# Patient Record
Sex: Female | Born: 1970
Health system: Southern US, Community
[De-identification: ages and names within clinical notes are randomized; demographics above are authoritative.]

## PROBLEM LIST (undated history)

## (undated) DIAGNOSIS — J45909 Unspecified asthma, uncomplicated: Secondary | ICD-10-CM

## (undated) DIAGNOSIS — G709 Myoneural disorder, unspecified: Secondary | ICD-10-CM

## (undated) DIAGNOSIS — R0602 Shortness of breath: Secondary | ICD-10-CM

## (undated) DIAGNOSIS — K4090 Unilateral inguinal hernia, without obstruction or gangrene, not specified as recurrent: Secondary | ICD-10-CM

## (undated) DIAGNOSIS — N189 Chronic kidney disease, unspecified: Secondary | ICD-10-CM

## (undated) DIAGNOSIS — T7840XA Allergy, unspecified, initial encounter: Secondary | ICD-10-CM

## (undated) DIAGNOSIS — M1990 Unspecified osteoarthritis, unspecified site: Secondary | ICD-10-CM

## (undated) DIAGNOSIS — I509 Heart failure, unspecified: Secondary | ICD-10-CM

## (undated) HISTORY — PX: COLONOSCOPY: SHX174

## (undated) HISTORY — DX: Unspecified asthma, uncomplicated: J45.909

## (undated) HISTORY — DX: Unilateral inguinal hernia, without obstruction or gangrene, not specified as recurrent: K40.90

## (undated) HISTORY — DX: Allergy, unspecified, initial encounter: T78.40XA

## (undated) HISTORY — PX: POLYPECTOMY: SHX149

## (undated) HISTORY — PX: TUBAL LIGATION: SHX77

## (undated) HISTORY — PX: HERNIA REPAIR: SHX51

## (undated) HISTORY — DX: Myoneural disorder, unspecified: G70.9

---

## 2004-08-15 ENCOUNTER — Inpatient Hospital Stay (HOSPITAL_COMMUNITY): Admission: AD | Admit: 2004-08-15 | Discharge: 2004-08-15 | Payer: Self-pay | Admitting: Obstetrics & Gynecology

## 2004-08-24 ENCOUNTER — Ambulatory Visit: Payer: Self-pay | Admitting: Family Medicine

## 2005-02-05 ENCOUNTER — Inpatient Hospital Stay (HOSPITAL_COMMUNITY): Admission: AD | Admit: 2005-02-05 | Discharge: 2005-02-06 | Payer: Self-pay | Admitting: Obstetrics

## 2005-02-09 ENCOUNTER — Encounter (INDEPENDENT_AMBULATORY_CARE_PROVIDER_SITE_OTHER): Payer: Self-pay | Admitting: *Deleted

## 2005-02-09 ENCOUNTER — Inpatient Hospital Stay (HOSPITAL_COMMUNITY): Admission: AD | Admit: 2005-02-09 | Discharge: 2005-02-12 | Payer: Self-pay | Admitting: *Deleted

## 2005-02-14 ENCOUNTER — Inpatient Hospital Stay (HOSPITAL_COMMUNITY): Admission: AD | Admit: 2005-02-14 | Discharge: 2005-02-18 | Payer: Self-pay | Admitting: Obstetrics

## 2005-02-15 ENCOUNTER — Encounter (INDEPENDENT_AMBULATORY_CARE_PROVIDER_SITE_OTHER): Payer: Self-pay | Admitting: Cardiology

## 2005-10-24 ENCOUNTER — Emergency Department (HOSPITAL_COMMUNITY): Admission: EM | Admit: 2005-10-24 | Discharge: 2005-10-24 | Payer: Self-pay | Admitting: Family Medicine

## 2006-04-16 ENCOUNTER — Ambulatory Visit (HOSPITAL_COMMUNITY): Admission: AD | Admit: 2006-04-16 | Discharge: 2006-04-18 | Payer: Self-pay | Admitting: Obstetrics

## 2006-04-16 ENCOUNTER — Encounter: Payer: Self-pay | Admitting: Emergency Medicine

## 2006-04-16 ENCOUNTER — Emergency Department (HOSPITAL_COMMUNITY): Admission: EM | Admit: 2006-04-16 | Discharge: 2006-04-16 | Payer: Self-pay | Admitting: Emergency Medicine

## 2006-04-16 ENCOUNTER — Encounter (INDEPENDENT_AMBULATORY_CARE_PROVIDER_SITE_OTHER): Payer: Self-pay | Admitting: *Deleted

## 2006-08-17 ENCOUNTER — Ambulatory Visit: Payer: Self-pay | Admitting: Family Medicine

## 2006-10-02 ENCOUNTER — Encounter (INDEPENDENT_AMBULATORY_CARE_PROVIDER_SITE_OTHER): Payer: Self-pay | Admitting: Family Medicine

## 2006-10-02 ENCOUNTER — Ambulatory Visit: Payer: Self-pay | Admitting: Family Medicine

## 2006-10-03 ENCOUNTER — Ambulatory Visit (HOSPITAL_COMMUNITY): Admission: RE | Admit: 2006-10-03 | Discharge: 2006-10-03 | Payer: Self-pay | Admitting: Family Medicine

## 2006-11-13 ENCOUNTER — Ambulatory Visit: Payer: Self-pay | Admitting: Family Medicine

## 2007-01-28 ENCOUNTER — Ambulatory Visit: Payer: Self-pay | Admitting: *Deleted

## 2007-01-28 ENCOUNTER — Emergency Department (HOSPITAL_COMMUNITY): Admission: EM | Admit: 2007-01-28 | Discharge: 2007-01-28 | Payer: Self-pay | Admitting: Family Medicine

## 2007-01-30 ENCOUNTER — Ambulatory Visit: Payer: Self-pay | Admitting: Family Medicine

## 2007-02-13 ENCOUNTER — Emergency Department (HOSPITAL_COMMUNITY): Admission: EM | Admit: 2007-02-13 | Discharge: 2007-02-13 | Payer: Self-pay | Admitting: Emergency Medicine

## 2007-03-07 ENCOUNTER — Emergency Department (HOSPITAL_COMMUNITY): Admission: EM | Admit: 2007-03-07 | Discharge: 2007-03-07 | Payer: Self-pay | Admitting: Emergency Medicine

## 2007-03-08 ENCOUNTER — Ambulatory Visit: Payer: Self-pay | Admitting: Family Medicine

## 2007-03-11 ENCOUNTER — Ambulatory Visit (HOSPITAL_COMMUNITY): Admission: RE | Admit: 2007-03-11 | Discharge: 2007-03-11 | Payer: Self-pay | Admitting: Family Medicine

## 2007-03-21 ENCOUNTER — Ambulatory Visit: Payer: Self-pay | Admitting: Family Medicine

## 2007-04-03 ENCOUNTER — Ambulatory Visit (HOSPITAL_COMMUNITY): Admission: RE | Admit: 2007-04-03 | Discharge: 2007-04-03 | Payer: Self-pay | Admitting: Family Medicine

## 2007-05-15 ENCOUNTER — Encounter (INDEPENDENT_AMBULATORY_CARE_PROVIDER_SITE_OTHER): Payer: Self-pay | Admitting: *Deleted

## 2007-05-24 ENCOUNTER — Ambulatory Visit: Payer: Self-pay | Admitting: Family Medicine

## 2007-05-28 ENCOUNTER — Ambulatory Visit (HOSPITAL_COMMUNITY): Admission: RE | Admit: 2007-05-28 | Discharge: 2007-05-28 | Payer: Self-pay | Admitting: Family Medicine

## 2007-05-31 ENCOUNTER — Ambulatory Visit: Payer: Self-pay | Admitting: Family Medicine

## 2007-07-17 ENCOUNTER — Ambulatory Visit: Payer: Self-pay | Admitting: Family Medicine

## 2007-11-25 ENCOUNTER — Ambulatory Visit: Payer: Self-pay | Admitting: Internal Medicine

## 2007-12-05 ENCOUNTER — Ambulatory Visit: Payer: Self-pay | Admitting: Family Medicine

## 2008-01-13 ENCOUNTER — Ambulatory Visit: Payer: Self-pay | Admitting: Internal Medicine

## 2009-04-19 ENCOUNTER — Ambulatory Visit: Payer: Self-pay | Admitting: Internal Medicine

## 2010-02-06 ENCOUNTER — Emergency Department (HOSPITAL_COMMUNITY): Admission: EM | Admit: 2010-02-06 | Discharge: 2010-02-06 | Payer: Self-pay | Admitting: Emergency Medicine

## 2010-06-28 ENCOUNTER — Encounter (INDEPENDENT_AMBULATORY_CARE_PROVIDER_SITE_OTHER): Payer: Self-pay | Admitting: Family Medicine

## 2010-06-28 LAB — CONVERTED CEMR LAB
Cholesterol: 159 mg/dL (ref 0–200)
HDL: 40 mg/dL (ref >39)
LDL Cholesterol: 93 mg/dL (ref 0–99)
Total CHOL/HDL Ratio: 4
Triglycerides: 132 mg/dL (ref <150)
VLDL: 26 mg/dL (ref 0–40)

## 2010-09-18 ENCOUNTER — Encounter: Payer: Self-pay | Admitting: Family Medicine

## 2010-09-19 ENCOUNTER — Encounter: Payer: Self-pay | Admitting: Family Medicine

## 2010-09-28 ENCOUNTER — Encounter: Payer: Self-pay | Admitting: Family Medicine

## 2010-11-14 LAB — BASIC METABOLIC PANEL
BUN: 13 mg/dL (ref 6–23)
CO2: 21 mEq/L (ref 19–32)
Calcium: 9.1 mg/dL (ref 8.4–10.5)
Chloride: 110 mEq/L (ref 96–112)
Creatinine, Ser: 0.75 mg/dL (ref 0.4–1.2)
GFR calc Af Amer: >60 mL/min (ref >60)
GFR calc non Af Amer: >60 mL/min (ref >60)
Glucose, Bld: 100 mg/dL — ABNORMAL HIGH (ref 70–99)
Potassium: 3.9 mEq/L (ref 3.5–5.1)
Sodium: 137 mEq/L (ref 135–145)

## 2010-11-14 LAB — CBC
HCT: 37.7 % (ref 36.0–46.0)
Hemoglobin: 12.7 g/dL (ref 12.0–15.0)
MCHC: 33.8 g/dL (ref 30.0–36.0)
MCV: 90.9 fL (ref 78.0–100.0)
Platelets: 244 10*3/uL (ref 150–400)
RBC: 4.15 MIL/uL (ref 3.87–5.11)
RDW: 14.2 % (ref 11.5–15.5)
WBC: 10.1 10*3/uL (ref 4.0–10.5)

## 2010-11-14 LAB — URINALYSIS, ROUTINE W REFLEX MICROSCOPIC
Bilirubin Urine: NEGATIVE
Glucose, UA: NEGATIVE mg/dL
Hgb urine dipstick: NEGATIVE
Ketones, ur: NEGATIVE mg/dL
Nitrite: NEGATIVE
Protein, ur: NEGATIVE mg/dL
Specific Gravity, Urine: 1.02 (ref 1.005–1.030)
Urobilinogen, UA: 0.2 mg/dL (ref 0.0–1.0)
pH: 6 (ref 5.0–8.0)

## 2010-11-14 LAB — URINE MICROSCOPIC-ADD ON

## 2010-11-14 LAB — URINE CULTURE: Colony Count: 40000

## 2010-11-14 LAB — POCT PREGNANCY, URINE: Preg Test, Ur: NEGATIVE

## 2011-01-13 NOTE — Op Note (Signed)
NAMEDYLANA, SHAW NO.:  [id number]   MEDICAL RECORD NO.:  [id number]          PATIENT TYPE:  INP   LOCATION:                                FACILITY:  [facility]   PHYSICIAN:  Kathreen Cosier, M.D.DATE OF BIRTH:  September 13, 1970   DATE OF PROCEDURE:  02/09/2005  DATE OF DISCHARGE:                                 OPERATIVE REPORT   PREOPERATIVE DIAGNOSIS:  Breech presentation in labor, at term.   SURGEON:  Kathreen Cosier, M.D.   ANESTHESIA:  Spinal.   DESCRIPTION OF PROCEDURE:  The patient placed on the operating room in the  supine position.  After spinal anesthesia, abdomen prepped and draped.  Bladder emptied and Foley catheter.  Transverse suprapubic incision made,  carried down through the rectus fascia.  The fascia was cleaned and incised  the length of the incision.  Rectus muscle were retracted laterally.  The  peritoneum was incised longitudinally.  Transverse incision made on the  vesicouterine peritoneum above the bladder.  The bladder mobilized  inferiorly.  Transverse lower uterine incision made.  The fluid was meconium-  stained.  She was a double-footling breech presentation.  The baby delivered  at breech extraction.  It was a female, Apgars 8 and 9, weighing 9 pounds 7  ounces.  The placenta was anterior, removed manually.  Uterine cavity was  cleaned with dry laps.  Uterine incision closed in one layer with continuous  suture of #1 chromic.  Bladder flap reattached with 2-0 chromic.  Uterus  well contracted.  Tubes and ovaries normal.  The right tube was grasped in  the mid portion with the Babcock clamp, 0 plain suture placed on the  mesosalpinx below the portion of the tube and the clamp.  This was tied.  Approximately one inch of tube was transected with hemostasis satisfactory.  Procedure done in the exact fashion on the other side.  Lap and sponge  counts correct.  Abdomen closed in layers.  Peritoneum with continuous  suture of 0 chromic,  fascia with continuous suture of 0 Dexon and skin  closed with subcuticular stitch of 4-0 Monocryl.  Blood loss 700 mL.       BAM/MEDQ  D:  02/09/2005  T:  02/10/2005  Job:  [id number]

## 2011-01-13 NOTE — Discharge Summary (Signed)
NAMEBREONNA, Eileen [name] NO.:  [id number]   MEDICAL RECORD NO.:  [id number]          PATIENT TYPE:  INP   LOCATION:  9317                          FACILITY:  [facility]   PHYSICIAN:  Kathreen Cosier, M.D.DATE OF BIRTH:  02/10/1971   DATE OF ADMISSION:  04/16/2006  DATE OF DISCHARGE:  04/18/2006                                 DISCHARGE SUMMARY   The patient is a 40 year old gravida 5, para 4-0-1-4, last menstrual period  March 09, 2006.  She had a C-section in the past and tubal ligation and was  now admitted with a ruptured ectopic pregnancy.  She underwent a mini-lap  and 200 mL of free blood and clots in her abdominal cavity __________  leaking right ampullary ectopic.  She desired a bilateral salpingectomy,  which was performed.  She was discharged home on the second postoperative  day, ambulatory, on a regular diet, on Tylox for pain.  Day #1 after  surgery, hemoglobin 10.5, white count 13, platelets 299.   DISCHARGE DIAGNOSIS:  Status post bilateral salpingectomy for ruptured  ectopic pregnancy.           ______________________________  Kathreen Cosier, M.D.     BAM/MEDQ  D:  05/09/2006  T:  05/09/2006  Job:  [id number]

## 2011-01-13 NOTE — Discharge Summary (Signed)
NAMESHERILEE, SMOTHERMAN NO.:  [id number]   MEDICAL RECORD NO.:  [id number]           PATIENT TYPE:   LOCATION:                                 FACILITY:   PHYSICIAN:  Kathreen Cosier, M.D.   DATE OF BIRTH:   DATE OF ADMISSION:  02/10/2004  DATE OF DISCHARGE:                                 DISCHARGE SUMMARY   The patient is a 40 year old gravida 4, para 3, 0, 3, EDC: February 05, 2005.  She was in labor. Cervix 2 cm, breech presentation. Also desired  sterilization. She underwent a low transverse cesarean section and had a 9  pound, 7 ounce female, breech, Apgar's 8 and 9.  She also had a tubal ligation performed. She did well postoperatively.   On admission her hemoglobin was 12, postoperatively 10.5, platelets 255, 000  and 228,000. White count 8900, and 14,900. RPR negative. Urine negative.   She was discharged home on the third postoperative day ambulatory, on a  regular diet.  She is to see me in 6 weeks.   DISCHARGE DIAGNOSIS:  1.  Status post breech presentation at term, in labor.  2.  Primary low transverse cesarean section.  3.  Tubal ligation.           ______________________________  Kathreen Cosier, M.D.     BAM/MEDQ  D:  04/19/2005  T:  04/19/2005  Job:  [id number]

## 2011-01-13 NOTE — Discharge Summary (Signed)
NAMETAMMI, BOULIER NO.:  [id number]   MEDICAL RECORD NO.:  [id number]          PATIENT TYPE:  INP   LOCATION:  9311                          [facility]ACILITY:  [facility]   PHYSICIAN:  Kathreen Cosier, M.D.DATE OF BIRTH:  Sep 09, 1970   DATE OF ADMISSION:  02/14/2005  DATE OF DISCHARGE:  02/18/2005                                 DISCHARGE SUMMARY   HOSPITAL COURSE:  The patient is a 40 year old gravida 4 para 4-0-0-4 who  had a C-section and tubal ligation on February 09, 2005 and was admitted with  shortness of breath. Chest x-ray showed pulmonary edema. EKG was normal, CT  was normal. PO2 on room air 98. She was seen in consult by Dr. Shana Chute and  also found to have renal failure, mild, and was seen in consult by the  nephrologist. She had a 2-D echocardiogram which was normal. Her pulmonary  edema resolved with Lasix and her 2-D echocardiogram was normal. She had  mild mitral regurgitation and her renal insult was thought to be the cause  of her problem. She would be followed by the nephrologist as an outpatient  and she was discharged on February 17, 2005.       BAM/MEDQ  D:  03/22/2005  T:  03/22/2005  Job:  [id number]

## 2011-01-13 NOTE — Consult Note (Signed)
Eileen [name], Eileen [name]                ACCOUNT NO.:  [id number]   MEDICAL RECORD NO.:  [id number]          PATIENT TYPE:  INP   LOCATION:  9372                          [facility]ACILITY:  [facility]   PHYSICIAN:  Cecille Aver, M.D.DATE OF BIRTH:  24-Dec-1970   DATE OF CONSULTATION:  02/15/2005  DATE OF DISCHARGE:                                   CONSULTATION   REASON FOR REFERRAL:  Acute renal failure.   HISTORY OF PRESENT ILLNESS:  [name] is a 40 year old Spanish-speaking  female with apparently no past medical history.  She is status post C-  section delivery of a breech baby on February 09, 2005.  No previous measure of  kidney function, however we do have a urinalysis from December 2005 which  revealed no protein and no blood.  She presented to the emergency department  on February 13, 2005 with shortness of breath, chest x-ray equaled mild  pulmonary edema.  She underwent a CT angiogram with 300 mL of Omnipaque  which revealed no PE and this was done on February 14, 2005.  The evening of  February 14, 2005 labs showed BUN of 45 and creatinine of 3.2.  These labs are  unchanged today.  Urinalysis still negative for protein, large blood, but  micro equal squames only.  Renal ultrasound done today reveals a right  kidney size of 12.6, the left of 14.2 with normal echogenicity.  2D echo  results are pending.  Of note, the patient was taking ibuprofen prior to  admission.  Urine output now is 300-700 mL an hour.   PAST MEDICAL HISTORY:  Really negative.   CURRENT MEDICATIONS:  1.  Ampicillin.  2.  Coreg.  3.  Avapro.  4.  Demerol 1 dose.  5.  Phenergan.  6.  Lasix.  7.  The patient was on K-Dur.   ALLERGIES:  NO KNOWN DRUG ALLERGIES.   SOCIAL HISTORY:  The patient has four children.  There is no tobacco or  alcohol.   FAMILY HISTORY:  Unobtainable.   REVIEW OF SYSTEMS:  Really difficult to obtain secondary to language  barrier.  Positive chest pain with deep breathing.  Positive abdominal  pain  from incision.  No dysuria.  She is having polyuria, shortness of breath is  better.   PHYSICAL EXAMINATION:  Blood pressure 130/70, heart rate is 72, respirations  are normal, the patient is afebrile, oxygen saturation is 94% on room air.  GENERAL:  Sitting up in bed, looks as if not feeling well.  HEENT:  Pupils are equal, round and reactive to light, extraocular motions  are intact.  Mucous membranes are moist.  NECK:  There is minimal JVD, no carotid bruits.  CARDIOVASCULAR:  Regular rate and rhythm, no murmur, gallop, or rub.  LUNGS:  Clear to auscultation bilaterally presently.  ABDOMEN:  C-section incision and is really tender diffusely.  EXTREMITIES:  Reveal trace to 1+ edema.   LABS:  BUN and creatinine 48 and 3.2 with a potassium of 5.4, calcium 8.7,  hemoglobin 9.7, of note the patient was getting K-Dur.   ASSESSMENT:  A 40 year old Hispanic female with presumed acute renal failure  in the setting of recent C-section, high-dose nonsteroidal anti-  inflammatories, angiogram and Avapro.  1.  I presume acute renal failure since no past information known on      creatinine level and had a normal renal ultrasound and a fairly negative      urinalysis.  She has had several insults lately to renal function and is      now making large amounts of urine which is consistent with a post-injury-      type pattern.   I suggest stopping Lasix and Avapro.  I will substitute hydralazine for  Avapro and follow renal function closely.  I agree with holding K-Dur and we  will also substitute morphine for Demerol, as well in light of decreased  GFR.  Thank you for allowing me to participate in the care of this patient.  I will continue to follow her with you.        KAG/MEDQ  D:  02/15/2005  T:  02/15/2005  Job:  [id number]

## 2011-01-13 NOTE — Op Note (Signed)
NAMEDESHONNA, TRNKA NO.:  [id number]   MEDICAL RECORD NO.:  [id number]          PATIENT TYPE:  OIB   LOCATION:  9317                          [facility]ACILITY:  [facility]   PHYSICIAN:  Kathreen Cosier, M.D.DATE OF BIRTH:  [birth year]   DATE OF PROCEDURE:  04/16/2006  DATE OF DISCHARGE:                                 OPERATIVE REPORT   PREOPERATIVE DIAGNOSIS:  Ruptured ectopic pregnancy.   POSTOPERATIVE DIAGNOSIS:  Ruptured ectopic pregnancy.   SURGEON:  Kathreen Cosier, M.D.   ANESTHESIA:  General.   PROCEDURE:  The patient was placed on the operating table in the supine  position, under general anesthesia administered by Dr. Pamalee Leyden, abdomen  prepped and draped. Bladder emptied with a Foley catheter. Transverse  suprapubic incision 2 inches long made and carried down to fascia. Fascia  ____________ entire length of the incision. Rectus muscles retracted  laterally. Peritoneum incised longitudinally. About 200 cc of free blood in  the peritoneal cavity. She had a leaking right ampullary ectopic. Ovaries  were normal. A Kelly placed across the base of the ampulla on the right and  tube removed with Metzenbaum scissors. Hemostasis achieved with 0 Vicryl  suture. The remainder of the tube on the right was removed. Kelly clamps  placed across the base of the mesosalpinx and tube removed with Metzenbaum  scissors. Hemostasis was achieved with interrupted sutures of 0 Vicryl. On  the left side, the procedure was done in identical fashion. Bilateral  salpingectomy performed. Both ovaries normal. Uterus normal. Abdomen closed  in layers, peritoneum continuous suture of 0 chromic, fascia continuous  suture of 0 Dexon, skin closed with a subcuticular stitch of 4-0 Monocryl.  Blood loss 200 cc. The patient tolerated the procedure well and went to the  recovery room in good condition.           ______________________________  Kathreen Cosier, M.D.     BAM/MEDQ  D:   04/16/2006  T:  04/16/2006  Job:  [id number]

## 2011-06-13 LAB — POCT URINALYSIS DIP (DEVICE)
Bilirubin Urine: NEGATIVE
Glucose, UA: NEGATIVE
Ketones, ur: NEGATIVE
Nitrite: NEGATIVE
Operator id: 126491
Protein, ur: NEGATIVE
Specific Gravity, Urine: 1.02
Urobilinogen, UA: 0.2
pH: 5

## 2011-06-13 LAB — POCT PREGNANCY, URINE
Operator id: 282151
Preg Test, Ur: NEGATIVE

## 2011-06-14 LAB — URINE CULTURE
Colony Count: NO GROWTH
Culture: NO GROWTH

## 2011-06-14 LAB — POCT URINALYSIS DIP (DEVICE)
Bilirubin Urine: NEGATIVE
Glucose, UA: NEGATIVE
Hgb urine dipstick: NEGATIVE
Ketones, ur: NEGATIVE
Nitrite: NEGATIVE
Operator id: 239701
Protein, ur: NEGATIVE
Specific Gravity, Urine: 1.015
Urobilinogen, UA: 0.2
pH: 5.5

## 2011-06-14 LAB — POCT PREGNANCY, URINE
Operator id: 239701
Preg Test, Ur: NEGATIVE

## 2011-06-15 LAB — POCT URINALYSIS DIP (DEVICE)
Bilirubin Urine: NEGATIVE
Glucose, UA: NEGATIVE
Nitrite: NEGATIVE
Operator id: 235561

## 2011-06-15 LAB — POCT PREGNANCY, URINE
Operator id: 235561
Preg Test, Ur: NEGATIVE

## 2011-08-29 ENCOUNTER — Emergency Department (HOSPITAL_COMMUNITY)
Admission: EM | Admit: 2011-08-29 | Discharge: 2011-08-29 | Disposition: A | Discharge status: Home / Self Care | Payer: Self-pay | Attending: Emergency Medicine | Admitting: Emergency Medicine

## 2011-08-29 ENCOUNTER — Encounter: Payer: Self-pay | Admitting: Emergency Medicine

## 2011-08-29 DIAGNOSIS — R1909 Other intra-abdominal and pelvic swelling, mass and lump: Secondary | ICD-10-CM | POA: Insufficient documentation

## 2011-08-29 DIAGNOSIS — R109 Unspecified abdominal pain: Secondary | ICD-10-CM | POA: Insufficient documentation

## 2011-08-29 DIAGNOSIS — K4090 Unilateral inguinal hernia, without obstruction or gangrene, not specified as recurrent: Secondary | ICD-10-CM | POA: Insufficient documentation

## 2011-08-29 LAB — URINALYSIS, ROUTINE W REFLEX MICROSCOPIC
Nitrite: NEGATIVE
Protein, ur: NEGATIVE mg/dL
Specific Gravity, Urine: 1.022 (ref 1.005–1.030)
Urobilinogen, UA: 1 mg/dL (ref 0.0–1.0)

## 2011-08-29 LAB — POCT PREGNANCY, URINE: Preg Test, Ur: NEGATIVE

## 2011-08-29 NOTE — ED Provider Notes (Signed)
History     CSN: [id number]  Arrival date & time 08/29/11  1521   First MD Initiated Contact with Patient 08/29/11 1725      Chief Complaint  Patient presents with  . Abdominal Pain    (Consider location/radiation/quality/duration/timing/severity/associated sxs/prior treatment) HPI Comments: Patient noticed abdominal swelling yesterday in the right groin area. The area is nontender and the belching improves when she lies down. There is no redness or change in the skin. She denies any urinary symptoms. She does have a job where she does a lot of lifting it is unclear how long area has been like that.  The history is provided by a relative. The history is limited by a language barrier. A language interpreter was used.    History reviewed. No pertinent past medical history.  Past Surgical History  Procedure Date  . Cesarean section   . Tubal ligation     No family history on file.  History  Substance Use Topics  . Smoking status: Never Smoker   . Smokeless tobacco: Not on file  . Alcohol Use: No    OB History    Grav Para Term Preterm Abortions TAB SAB Ect Mult Living                  Review of Systems  Gastrointestinal: Negative for nausea, vomiting, abdominal pain and diarrhea.  Genitourinary: Negative for dysuria, vaginal bleeding, vaginal discharge and vaginal pain.  All other systems reviewed and are negative.    Allergies  Review of patient's allergies indicates no known allergies.  Home Medications  No current outpatient prescriptions on file.  BP 106/64  Pulse 74  Temp(Src) 97.5 F (36.4 C) (Oral)  Resp 21  SpO2 98%  LMP 08/25/2011  Physical Exam  Constitutional: She is oriented to person, place, and time. She appears well-developed and well-nourished. No distress.  HENT:  Head: Normocephalic and atraumatic.  Eyes: EOM are normal. Pupils are equal, round, and reactive to light.  Abdominal: Soft. Bowel sounds are normal. She exhibits no  distension. There is no tenderness. There is no rebound and no guarding. A hernia is present. Hernia confirmed positive in the right inguinal area.       Her hands easily reducible with lying down and soft and nontender with standing  Musculoskeletal: Normal range of motion. She exhibits no edema and no tenderness.  Neurological: She is alert and oriented to person, place, and time.  Skin: Skin is warm and dry. No rash noted. No erythema.    ED Course  Procedures (including critical care time)   Labs Reviewed  URINALYSIS, ROUTINE W REFLEX MICROSCOPIC  POCT PREGNANCY, URINE  POCT PREGNANCY, URINE   No results found.   No diagnosis found.    MDM    Patient with evidence of right inguinal hernia. Will refer to surgery for repair. No signs of incarcerated or straining a hernia at this time.       Gwyneth Sprout, MD 08/29/11 2020

## 2011-08-29 NOTE — ED Notes (Signed)
Pt. Has had her "tubes cut and tied."  Pt. Has c/o of "growing lump" in the right groin area.  The area is very painful and radiating to the whole right side.

## 2011-09-04 ENCOUNTER — Encounter (INDEPENDENT_AMBULATORY_CARE_PROVIDER_SITE_OTHER): Payer: Self-pay | Admitting: General Surgery

## 2011-09-04 ENCOUNTER — Ambulatory Visit (INDEPENDENT_AMBULATORY_CARE_PROVIDER_SITE_OTHER): Payer: PRIVATE HEALTH INSURANCE | Admitting: General Surgery

## 2011-09-04 VITALS — BP 120/68 | HR 66 | Temp 97.4°F | Resp 16 | Ht 61.5 in | Wt 145.8 lb

## 2011-09-04 DIAGNOSIS — K4090 Unilateral inguinal hernia, without obstruction or gangrene, not specified as recurrent: Secondary | ICD-10-CM

## 2011-09-04 NOTE — Progress Notes (Signed)
Patient ID: Eileen [name], female   DOB: [birth year], 41 y.o.   MRN: [id number]  Chief Complaint  Patient presents with  . Other    Eval right inguinal hernia    HPI Eileen [name] is a 41 y.o. female.   HPI 41 year old Hispanic female referred by [facility] and Dr. Anitra Lauth for evaluation of a right inguinal hernia. The patient states that she's had pain in her right groin area for about 6 years. However she just noticed a bulge in her right groin one week before New Year's. She says that it is more noticeable when she walks or when she is sitting in a car driving. She describes it as a sharp pain. She states that she's also had some nausea. She has also had some diarrhea but she thinks that it is unrelated. She denies any fevers or chills. She denies any dysuria. She denies any weight loss. She is a smoker. She's had a prior C-section. The bulge is more noticeable when she stands up. She denies any numbness or tingling. Past Medical History  Diagnosis Date  . Right inguinal hernia     Past Surgical History  Procedure Date  . Cesarean section   . Tubal ligation     Family History  Problem Relation Age of Onset  . Ulcers Father     stomach  . Diabetes Brother     Social History History  Substance Use Topics  . Smoking status: Current Everyday Smoker -- .3 years    Types: Cigarettes  . Smokeless tobacco: Never Used  . Alcohol Use: Not on file    No Known Allergies  No current outpatient prescriptions on file.    Review of Systems Review of Systems  Constitutional: Negative for fever, chills and unexpected weight change.  HENT: Negative for hearing loss, congestion, sore throat, trouble swallowing and voice change.   Eyes: Negative for visual disturbance.  Respiratory: Negative for cough and wheezing.   Cardiovascular: Negative for chest pain, palpitations and leg swelling.  Gastrointestinal: Negative for vomiting, constipation, blood in stool, abdominal  distention and anal bleeding.  Genitourinary: Negative for dysuria, hematuria, vaginal bleeding and difficulty urinating.  Musculoskeletal: Negative for arthralgias.  Skin: Negative for rash and wound.  Neurological: Negative for seizures, syncope and headaches.  Hematological: Negative for adenopathy. Does not bruise/bleed easily.  Psychiatric/Behavioral: Negative for confusion.    Blood pressure 120/68, pulse 66, temperature 97.4 F (36.3 C), temperature source Temporal, resp. rate 16, height 5' 1.5" (1.562 m), weight 145 lb 12.8 oz (66.134 kg), last menstrual period 08/25/2011.  Physical Exam Physical Exam  Vitals reviewed. Constitutional: She is oriented to person, place, and time. She appears well-developed and well-nourished. No distress.  HENT:  Head: Normocephalic and atraumatic.  Eyes: Conjunctivae are normal. No scleral icterus.  Neck: Normal range of motion. Neck supple. No thyromegaly present.  Cardiovascular: Normal rate, regular rhythm and normal heart sounds.   Pulmonary/Chest: Effort normal and breath sounds normal. No respiratory distress. She has no wheezes.  Abdominal: Soft. Bowel sounds are normal. She exhibits no distension. There is no tenderness. A hernia is present. Hernia confirmed positive in the right inguinal area.         Reducible RIH; old c/s incision  Musculoskeletal: Normal range of motion. She exhibits no edema and no tenderness.  Lymphadenopathy:    She has no cervical adenopathy.  Neurological: She is alert and oriented to person, place, and time.  Skin: Skin is warm and dry. She is not  diaphoretic. No erythema.  Psychiatric: She has a normal mood and affect. Her behavior is normal. Thought content normal.    Data Reviewed Dr Plunkett's note from the ED  Assessment    Right inguinal hernia    Plan    We discussed the etiology of inguinal hernias. We discussed the signs & symptoms of incarceration & strangulation.  We discussed  non-operative and operative management.  The patient has elected to proceed with OPEN RIGHT INGUINAL HERNIA REPAIR WITH MESH    I described the procedure in detail.  The patient was given educational material. We discussed the risks and benefits including but not limited to bleeding, infection, chronic inguinal pain, nerve entrapment, hernia recurrence, mesh complications, hematoma formation, urinary retention, injury to the testicles or the ovaries, numbness in the groin, blood clots, injury to the surrounding structures, and anesthesia risk. We also discussed the typical post operative recovery course, including no heavy lifting for 4-6 weeks. I explained that the likelihood of improvement of their symptoms is good.   This encounter was done with the aid of an interpreter.   [name]. Andrey Campanile, MD, FACS General, Bariatric, & Minimally Invasive Surgery [facility], [place]        [facility] M 09/04/2011, 10:24 AM

## 2011-09-04 NOTE — Patient Instructions (Signed)
Reparacin quirrgica de la hernia (Hernia, Surgical Repair) Una hernia ocurre cuando un rgano interno protruye a travs de un punto dbil de los msculos de la pared muscular abdominal (del vientre). Se producen con mayor frecuencia en la ingle y alrededor del ombligo. Generalmente puede volver a Museum/gallery exhibitions officer (reducirse). La mayor parte de las hernias tienden a Theme park manager con Museum/gallery conservator. Algunos problemas se producen cuando una parte del contenido abdominal se atasca en el orificio (hernia incarcerada). El flujo de [place] se corta (hernia estrangulada). Esto es una emergencia que requiere [place]. De otro modo, la reparacin quirrgica de la hernia es un procedimiento electivo. Esto significa que puede programarlo segn su conveniencia cuando no hay una emergencia. Como pueden ocurrir complicaciones, si decide la reparacin de la hernia, lo mejor es hacerlo lo antes posible. Cuando se convierte en una emergencia, aumenta el riesgo de complicaciones despus de la [place]. CAUSAS  Levantar objetos pesados.   La obesidad   Automatic Data.   Hacer fuerza para mover el intestino.   Tambin pueden producirse a travs del corte realizado por el cirujano (incisin ) despus de una operacin abdominal.  INSTRUCCIONES PARA EL CUIDADO DOMICILIARIO Antes del procedimiento:  No es necesario hacer reposo en cama. Puede continuar con sus actividades normales pero evite levantar objetos pesados (ms de 5 kg) y el estreimiento. Tosa con suavidad. Si actualmente usted fuma, es el momento de abandonar el hbito. Hasta el procedimiento quirrgico ms perfecto puede malograrse si se hace fuerza contnua para toser.   No use nada apretado sobre la hernia. No trate de mantenerla adentro con un vendaje externo o un braguero. Puede lesionar el contenido abdominal si los aprieta dentro del saco de la hernia.   Consumir una dieta normal. Evite la constipacin. El esfuerzo durante largos perodos para mover el  intestino aumentarn el tamao de la hernia. Esto tambin puede destruir lo que se ha reparado. Si no lo logra slo con la dieta, puede usar laxantes.  ANTES DE LA CIRUGA, SOLICITE ATENCIN MDICA DE INMEDIATO SI: Presenta problemas (sntomas) de una hernia incarcerada (atascada ). Los sntomas son:  La temperatura oral se eleva sin motivo por encima de 102 F (38.9 C) o segn le indique el profesional que lo asiste.   Aumenta el dolor abdominal.   Ganas de vomitar (nuseas) y vmitos.   Deja de eliminar gases o materia fecal.   La hernia se ha atascado fuera del abdomen, se ve descolorida, se siente dura o le duele.   Observa cambios en el hbito intestinal o en la hernia, lo que no es habitual en usted.  INFORME AL PROFESIONAL SOBRE LOS SIGUIENTES PUNTOS:  Alergias.   Medicamentos que toma, incluyendo hierbas, gotas oftlmicas, medicamentos de venta libre y cremas.   Uso de corticoides (por va oral o cremas).   Problemas anteriores debido a anestsicos o a Patent examiner.   Posibilidad de embarazo, si correspondiera.   Antecedentes de haber tenido cogulos sanguneos (tromboflebitis).   Antecedentes de hemorragias o problemas sanguneos.   Cirugas previas.   Otros problemas de [place].  ANTES DEL PROCEDIMIENTO Deber presentarse una hora antes del procedimiento a menos que el profesional que lo asiste le indique otra cosa.  LUEGO DEL PROCEDIMIENTO Despus de la [place], lo llevarn a una sala de recuperacin. Un enfermero lo observar y Personal assistant. Una vez que despierte, se encuentre estabilizado y pueda ingerir lquidos, excepto que ocurra un imprevisto, usted podr volver a su casa. Pollyann Savoy en  su casa aplique una bolsa de hielo (envuelta en una toalla) en la zona de la operacin para [employer]. Esto tambin ayuda a disminuir la hinchazn. No levante pesos mayores a 10 libras (4.55 kilogramos). Tome duchas, no baos. No conduzca mientras est tomando  narcticos. Siga las instrucciones que le ha dado el profesional que lo asiste.  SOLICITE ATENCIN MDICA DE INMEDIATO SI: Despus de la ciruga:  Presenta enrojecimiento, hinchazn o aumento del dolor en la herida.   Observa pus en la zona de la herida.   Hay un drenaje en la herida que dura ms de Civil engineer, contracting.   La temperatura oral se eleva sin motivo por encima de 102 F (38.9 C).   Advierte un olor ftido que proviene de la herida o del vendaje.   La herida se abre (los bordes no estn unidos) luego de Oceanographer las suturas.   Nota un incremento del dolor en los hombros (en la zona donde van los tirantes).   Presenta episodios de mareos o se siente dbil cuando est de pie.   Presenta nuseas o vmitos persistentes.   Aparece una erupcin cutnea.   Presenta dificultades respiratorias.   Desarrolla alguna reaccin o efecto secundario por los medicamentos administrados.  EST SEGURO QUE:   Comprende las instrucciones para el alta mdica.   Controlar su enfermedad.   Solicitar atencin mdica de inmediato segn las indicaciones.  Document Released: 08/14/2005 Document Revised: 04/26/2011 [facility] Patient Information 2012 [place], [place].

## 2011-09-06 ENCOUNTER — Telehealth (INDEPENDENT_AMBULATORY_CARE_PROVIDER_SITE_OTHER): Payer: Self-pay | Admitting: General Surgery

## 2011-09-06 NOTE — Telephone Encounter (Signed)
If patient can not reduce hernia (push it back in) please advise patient to go to [facility] ER to be evaluated to make sure she does not have an incarceration and needs to be fixed emergently.

## 2011-09-06 NOTE — Telephone Encounter (Signed)
Patient called to ask Dr. Migdalia Dk about her symptoms, she not scheduled for sx until 09/21/11 for hernia repair, she is having nausea and vomiting, she has had bm's today but painful, and the pain where her hernia is at is getting stronger.  Is there anything that can be done?

## 2011-09-07 ENCOUNTER — Emergency Department (HOSPITAL_COMMUNITY)
Admission: EM | Admit: 2011-09-07 | Discharge: 2011-09-07 | Disposition: A | Discharge status: Home / Self Care | Payer: Self-pay | Attending: Emergency Medicine | Admitting: Emergency Medicine

## 2011-09-07 ENCOUNTER — Encounter (HOSPITAL_COMMUNITY): Payer: Self-pay | Admitting: *Deleted

## 2011-09-07 DIAGNOSIS — K4091 Unilateral inguinal hernia, without obstruction or gangrene, recurrent: Secondary | ICD-10-CM | POA: Insufficient documentation

## 2011-09-07 DIAGNOSIS — K4090 Unilateral inguinal hernia, without obstruction or gangrene, not specified as recurrent: Secondary | ICD-10-CM

## 2011-09-07 DIAGNOSIS — R109 Unspecified abdominal pain: Secondary | ICD-10-CM | POA: Insufficient documentation

## 2011-09-07 MED ORDER — HYDROCODONE-ACETAMINOPHEN 5-325 MG PO TABS: 2.0000 | ORAL_TABLET | ORAL | Status: DC | PRN

## 2011-09-07 NOTE — ED Provider Notes (Signed)
History     CSN: [id number]  Arrival date & time 09/07/11  0543   First MD Initiated Contact with Patient 09/07/11 0700      Chief Complaint  Patient presents with  . Inguinal Hernia    Right side hernia    (Consider location/radiation/quality/duration/timing/severity/associated sxs/prior treatment) The history is provided by the patient. The history is limited by a language barrier.   patient has a known right inguinal hernia. She's scheduled to have surgery for it on the 24th by Dr. Andrey Campanile. She states that today it became more painful. No vomiting. She denied breakfast. She primarily speaks [place].  Past Medical History  Diagnosis Date  . Right inguinal hernia     Past Surgical History  Procedure Date  . Cesarean section   . Tubal ligation     Family History  Problem Relation Age of Onset  . Ulcers Father     stomach  . Diabetes Brother     History  Substance Use Topics  . Smoking status: Current Everyday Smoker -- .3 years    Types: Cigarettes  . Smokeless tobacco: Never Used  . Alcohol Use: Not on file    OB History    Grav Para Term Preterm Abortions TAB SAB Ect Mult Living                  Review of Systems  Unable to perform ROS Constitutional: Negative for chills and fatigue.  Gastrointestinal: Negative for abdominal pain.    Allergies  Review of patient's allergies indicates no known allergies.  Home Medications   Current Outpatient Rx  Name Route Sig Dispense Refill  . HYDROCODONE-ACETAMINOPHEN 5-325 MG PO TABS Oral Take 2 tablets by mouth every 4 (four) hours as needed for pain. 10 tablet 0    BP 97/64  Pulse 65  Temp(Src) 98.1 F (36.7 C) (Oral)  Resp 18  SpO2 99%  LMP 08/25/2011  Physical Exam  Constitutional: She appears well-developed and well-nourished.  HENT:  Head: Normocephalic.  Eyes: Pupils are equal, round, and reactive to light.  Cardiovascular: Normal rate.   Pulmonary/Chest: Effort normal.  Abdominal: Soft.  She exhibits no distension. There is no guarding.       Right inguinal hernia. Tender, but reducible. Examined both laying and standing. Abdomen otherwise nontender.    ED Course  Procedures (including critical care time)  Labs Reviewed - No data to display No results found.   1. Right inguinal hernia       MDM  Right inguinal hernia. Reducible. Does not appear to be obstructed incarcerated or strangulated. discussed with Dr. Dwain Sarna. He recommended calling the office and time it is more painful.     Juliet Rude. Rubin Payor, MD 09/07/11 0730

## 2011-09-07 NOTE — ED Notes (Signed)
Son and EDP at [facility], pt calm, NAD, alert, resting on L side in fetal position.

## 2011-09-07 NOTE — Telephone Encounter (Signed)
09/06/11-called pt and told her to try and reduce her hernia, if she was unable to do so, she needed to go the ER.

## 2011-09-07 NOTE — ED Notes (Signed)
Here today for same sx as 08/29/2011. referred to see [facility] ans was seen on 1/7. Surgery scheduled for 1/24. Her tonight b/c pain and nausea worse. No meds PTA.

## 2011-09-11 ENCOUNTER — Encounter (HOSPITAL_COMMUNITY): Payer: Self-pay | Admitting: Pharmacy Technician

## 2011-09-11 ENCOUNTER — Encounter (HOSPITAL_COMMUNITY): Payer: Self-pay

## 2011-09-11 MED ORDER — CEFAZOLIN SODIUM 1-5 GM-% IV SOLN
1.0000 g | INTRAVENOUS | Status: AC
  Administered 2011-09-12: 1 g via INTRAVENOUS
  Filled 2011-09-11: qty 50

## 2011-09-11 MED ORDER — CHLORHEXIDINE GLUCONATE 4 % EX LIQD
1.0000 "application " | Freq: Once | CUTANEOUS | Status: DC
  Filled 2011-09-11: qty 15

## 2011-09-11 NOTE — Progress Notes (Signed)
Patient states had complications  7 years ago after c-section with "fluid in lungs and heart", contacted health information management and requested copy of tests/notes from CD related to this hospitalization.

## 2011-09-12 ENCOUNTER — Encounter (HOSPITAL_COMMUNITY): Payer: Self-pay | Admitting: *Deleted

## 2011-09-12 ENCOUNTER — Encounter (HOSPITAL_COMMUNITY): Payer: Self-pay | Admitting: Certified Registered"

## 2011-09-12 ENCOUNTER — Ambulatory Visit (HOSPITAL_COMMUNITY): Payer: Self-pay

## 2011-09-12 ENCOUNTER — Ambulatory Visit (HOSPITAL_COMMUNITY): Payer: Self-pay | Admitting: Certified Registered"

## 2011-09-12 ENCOUNTER — Other Ambulatory Visit: Payer: Self-pay

## 2011-09-12 ENCOUNTER — Encounter (HOSPITAL_COMMUNITY)
Admission: RE | Disposition: A | Discharge status: Home / Self Care | Payer: Self-pay | Source: Ambulatory Visit | Attending: General Surgery

## 2011-09-12 ENCOUNTER — Ambulatory Visit (HOSPITAL_COMMUNITY)
Admission: RE | Admit: 2011-09-12 | Discharge: 2011-09-12 | Disposition: A | Discharge status: Home / Self Care | Payer: Self-pay | Source: Ambulatory Visit | Attending: General Surgery | Admitting: General Surgery

## 2011-09-12 DIAGNOSIS — K4090 Unilateral inguinal hernia, without obstruction or gangrene, not specified as recurrent: Secondary | ICD-10-CM | POA: Insufficient documentation

## 2011-09-12 DIAGNOSIS — R0602 Shortness of breath: Secondary | ICD-10-CM | POA: Insufficient documentation

## 2011-09-12 DIAGNOSIS — F17200 Nicotine dependence, unspecified, uncomplicated: Secondary | ICD-10-CM | POA: Insufficient documentation

## 2011-09-12 HISTORY — DX: Chronic kidney disease, unspecified: N18.9

## 2011-09-12 HISTORY — DX: Heart failure, unspecified: I50.9

## 2011-09-12 HISTORY — DX: Unspecified osteoarthritis, unspecified site: M19.90

## 2011-09-12 HISTORY — DX: Shortness of breath: R06.02

## 2011-09-12 HISTORY — PX: HERNIA REPAIR: SHX51

## 2011-09-12 HISTORY — PX: INGUINAL HERNIA REPAIR: SHX194

## 2011-09-12 LAB — CBC
Hemoglobin: 13.1 g/dL (ref 12.0–15.0)
MCHC: 34.9 g/dL (ref 30.0–36.0)
RBC: 4.2 MIL/uL (ref 3.87–5.11)
WBC: 7.3 10*3/uL (ref 4.0–10.5)

## 2011-09-12 LAB — DIFFERENTIAL
Basophils Relative: 0 % (ref 0–1)
Lymphocytes Relative: 26 % (ref 12–46)
Monocytes Relative: 10 % (ref 3–12)
Neutro Abs: 4.5 10*3/uL (ref 1.7–7.7)
Neutrophils Relative %: 62 % (ref 43–77)

## 2011-09-12 LAB — BASIC METABOLIC PANEL
Calcium: 9.2 mg/dL (ref 8.4–10.5)
Creatinine, Ser: 0.69 mg/dL (ref 0.50–1.10)
GFR calc non Af Amer: >90 mL/min (ref >90)
Glucose, Bld: 82 mg/dL (ref 70–99)
Sodium: 139 mEq/L (ref 135–145)

## 2011-09-12 LAB — SURGICAL PCR SCREEN: Staphylococcus aureus: NEGATIVE

## 2011-09-12 SURGERY — REPAIR, HERNIA, INGUINAL, ADULT
Anesthesia: General | Site: Groin | Laterality: Right | Wound class: Clean

## 2011-09-12 MED ORDER — FENTANYL CITRATE 0.05 MG/ML IJ SOLN
INTRAMUSCULAR | Status: DC | PRN
  Administered 2011-09-12 (×2): 50 ug via INTRAVENOUS
  Administered 2011-09-12: 100 ug via INTRAVENOUS
  Administered 2011-09-12: 50 ug via INTRAVENOUS

## 2011-09-12 MED ORDER — ONDANSETRON HCL 4 MG/2ML IJ SOLN
INTRAMUSCULAR | Status: DC | PRN
  Administered 2011-09-12: 4 mg via INTRAVENOUS

## 2011-09-12 MED ORDER — VECURONIUM BROMIDE 10 MG IV SOLR
INTRAVENOUS | Status: DC | PRN
  Administered 2011-09-12: 1 mg via INTRAVENOUS

## 2011-09-12 MED ORDER — HYDROMORPHONE HCL PF 1 MG/ML IJ SOLN
INTRAMUSCULAR | Status: AC
  Filled 2011-09-12: qty 1

## 2011-09-12 MED ORDER — NEOSTIGMINE METHYLSULFATE 1 MG/ML IJ SOLN
INTRAMUSCULAR | Status: DC | PRN
  Administered 2011-09-12: 3 mg via INTRAVENOUS

## 2011-09-12 MED ORDER — ONDANSETRON HCL 4 MG/2ML IJ SOLN: 4.0000 mg | Freq: Four times a day (QID) | INTRAMUSCULAR | Status: DC | PRN

## 2011-09-12 MED ORDER — OXYCODONE HCL 5 MG PO TABS
ORAL_TABLET | ORAL | Status: AC
  Administered 2011-09-12: 10 mg via ORAL
  Filled 2011-09-12: qty 2

## 2011-09-12 MED ORDER — PROPOFOL 10 MG/ML IV EMUL
INTRAVENOUS | Status: DC | PRN
  Administered 2011-09-12: 200 mg via INTRAVENOUS

## 2011-09-12 MED ORDER — MIDAZOLAM HCL 5 MG/5ML IJ SOLN
INTRAMUSCULAR | Status: DC | PRN
  Administered 2011-09-12: 2 mg via INTRAVENOUS

## 2011-09-12 MED ORDER — PROMETHAZINE HCL 25 MG/ML IJ SOLN: 12.5000 mg | Freq: Four times a day (QID) | INTRAMUSCULAR | Status: DC | PRN

## 2011-09-12 MED ORDER — OXYCODONE HCL 5 MG PO TABS
5.0000 mg | ORAL_TABLET | ORAL | Status: DC | PRN
  Administered 2011-09-12: 10 mg via ORAL

## 2011-09-12 MED ORDER — ACETAMINOPHEN 650 MG RE SUPP: 650.0000 mg | RECTAL | Status: DC | PRN

## 2011-09-12 MED ORDER — ONDANSETRON HCL 4 MG/2ML IJ SOLN
4.0000 mg | Freq: Four times a day (QID) | INTRAMUSCULAR | Status: DC | PRN
  Administered 2011-09-12: 4 mg via INTRAVENOUS

## 2011-09-12 MED ORDER — HYDROMORPHONE HCL PF 1 MG/ML IJ SOLN
0.2500 mg | INTRAMUSCULAR | Status: DC | PRN
  Administered 2011-09-12 (×2): 0.5 mg via INTRAVENOUS
  Administered 2011-09-12: 0.25 mg via INTRAVENOUS

## 2011-09-12 MED ORDER — ROCURONIUM BROMIDE 100 MG/10ML IV SOLN
INTRAVENOUS | Status: DC | PRN
  Administered 2011-09-12: 50 mg via INTRAVENOUS

## 2011-09-12 MED ORDER — BUPIVACAINE-EPINEPHRINE 0.25% -1:200000 IJ SOLN
INTRAMUSCULAR | Status: DC | PRN
  Administered 2011-09-12: 4 mL

## 2011-09-12 MED ORDER — SODIUM CHLORIDE 0.9 % IJ SOLN: 3.0000 mL | INTRAMUSCULAR | Status: DC | PRN

## 2011-09-12 MED ORDER — BUPIVACAINE LIPOSOME 1.3 % IJ SUSP
20.0000 mL | INTRAMUSCULAR | Status: AC
  Administered 2011-09-12: 20 mL
  Filled 2011-09-12: qty 20

## 2011-09-12 MED ORDER — GLYCOPYRROLATE 0.2 MG/ML IJ SOLN
INTRAMUSCULAR | Status: DC | PRN
  Administered 2011-09-12: .6 mg via INTRAVENOUS

## 2011-09-12 MED ORDER — LACTATED RINGERS IV SOLN
INTRAVENOUS | Status: DC | PRN
  Administered 2011-09-12: 10:00:00 via INTRAVENOUS

## 2011-09-12 MED ORDER — SODIUM CHLORIDE 0.9 % IV SOLN: 250.0000 mL | INTRAVENOUS | Status: DC | PRN

## 2011-09-12 MED ORDER — SODIUM CHLORIDE 0.9 % IV SOLN: INTRAVENOUS | Status: DC

## 2011-09-12 MED ORDER — MORPHINE SULFATE 2 MG/ML IJ SOLN: 1.0000 mg | INTRAMUSCULAR | Status: DC | PRN

## 2011-09-12 MED ORDER — SODIUM CHLORIDE 0.9 % IJ SOLN: 3.0000 mL | Freq: Two times a day (BID) | INTRAMUSCULAR | Status: DC

## 2011-09-12 MED ORDER — 0.9 % SODIUM CHLORIDE (POUR BTL) OPTIME
TOPICAL | Status: DC | PRN
  Administered 2011-09-12: 1000 mL

## 2011-09-12 MED ORDER — OXYCODONE-ACETAMINOPHEN 5-325 MG PO TABS: 1.0000 | ORAL_TABLET | ORAL | Status: DC | PRN

## 2011-09-12 MED ORDER — ACETAMINOPHEN 325 MG PO TABS: 650.0000 mg | ORAL_TABLET | ORAL | Status: DC | PRN

## 2011-09-12 MED ORDER — SODIUM CHLORIDE 0.9 % IJ SOLN
INTRAMUSCULAR | Status: DC | PRN
  Administered 2011-09-12: 10 mL

## 2011-09-12 SURGICAL SUPPLY — 53 items
BENZOIN TINCTURE PRP APPL 2/3 (GAUZE/BANDAGES/DRESSINGS) IMPLANT
BLADE SURG 10 STRL SS (BLADE) ×2 IMPLANT
BLADE SURG 15 STRL LF DISP TIS (BLADE) ×1 IMPLANT
BLADE SURG 15 STRL SS (BLADE) ×1
BLADE SURG ROTATE 9660 (MISCELLANEOUS) IMPLANT
CANISTER SUCTION 2500CC (MISCELLANEOUS) IMPLANT
CHLORAPREP W/TINT 26ML (MISCELLANEOUS) ×2 IMPLANT
CLOTH BEACON ORANGE TIMEOUT ST (SAFETY) ×2 IMPLANT
COVER SURGICAL LIGHT HANDLE (MISCELLANEOUS) ×2 IMPLANT
DECANTER SPIKE VIAL GLASS SM (MISCELLANEOUS) ×4 IMPLANT
DERMABOND ADHESIVE PROPEN (GAUZE/BANDAGES/DRESSINGS) ×2
DERMABOND ADVANCED .7 DNX6 (GAUZE/BANDAGES/DRESSINGS) ×2 IMPLANT
DRAIN PENROSE 1/2X12 LTX STRL (WOUND CARE) IMPLANT
DRAPE LAPAROTOMY TRNSV 102X78 (DRAPE) ×2 IMPLANT
DRAPE UTILITY 15X26 W/TAPE STR (DRAPE) ×4 IMPLANT
DRSG TEGADERM 4X4.75 (GAUZE/BANDAGES/DRESSINGS) IMPLANT
ELECT CAUTERY BLADE 6.4 (BLADE) ×2 IMPLANT
ELECT REM PT RETURN 9FT ADLT (ELECTROSURGICAL) ×2
ELECTRODE REM PT RTRN 9FT ADLT (ELECTROSURGICAL) ×1 IMPLANT
GLOVE BIOGEL M STRL SZ7.5 (GLOVE) ×4 IMPLANT
GLOVE BIOGEL PI IND STRL 7.0 (GLOVE) ×2 IMPLANT
GLOVE BIOGEL PI IND STRL 8 (GLOVE) ×1 IMPLANT
GLOVE BIOGEL PI INDICATOR 7.0 (GLOVE) ×2
GLOVE BIOGEL PI INDICATOR 8 (GLOVE) ×1
GLOVE ECLIPSE 7.5 STRL STRAW (GLOVE) IMPLANT
GLOVE SURG SS PI 6.5 STRL IVOR (GLOVE) ×4 IMPLANT
GOWN STRL NON-REIN LRG LVL3 (GOWN DISPOSABLE) ×6 IMPLANT
GOWN STRL REIN XL XLG (GOWN DISPOSABLE) ×2 IMPLANT
KIT BASIN OR (CUSTOM PROCEDURE TRAY) ×2 IMPLANT
KIT ROOM TURNOVER OR (KITS) ×2 IMPLANT
MESH PARIETEX PROGRIP RIGHT (Mesh General) ×2 IMPLANT
NEEDLE HYPO 25GX1X1/2 BEV (NEEDLE) ×4 IMPLANT
NS IRRIG 1000ML POUR BTL (IV SOLUTION) ×2 IMPLANT
PACK SURGICAL SETUP 50X90 (CUSTOM PROCEDURE TRAY) ×2 IMPLANT
PAD ARMBOARD 7.5X6 YLW CONV (MISCELLANEOUS) ×2 IMPLANT
PENCIL BUTTON HOLSTER BLD 10FT (ELECTRODE) ×2 IMPLANT
SPECIMEN JAR SMALL (MISCELLANEOUS) IMPLANT
SPONGE GAUZE 4X4 12PLY (GAUZE/BANDAGES/DRESSINGS) IMPLANT
SPONGE INTESTINAL PEANUT (DISPOSABLE) ×2 IMPLANT
SPONGE LAP 18X18 X RAY DECT (DISPOSABLE) ×2 IMPLANT
STRIP CLOSURE SKIN 1/2X4 (GAUZE/BANDAGES/DRESSINGS) IMPLANT
SUT MNCRL AB 4-0 PS2 18 (SUTURE) ×2 IMPLANT
SUT PROLENE 2 0 CT2 30 (SUTURE) ×4 IMPLANT
SUT VIC AB 2-0 CT1 36 (SUTURE) ×2 IMPLANT
SUT VIC AB 3-0 SH 18 (SUTURE) ×2 IMPLANT
SUT VICRYL AB 3 0 TIES (SUTURE) ×2 IMPLANT
SYR BULB 3OZ (MISCELLANEOUS) ×2 IMPLANT
SYR CONTROL 10ML LL (SYRINGE) ×4 IMPLANT
TOWEL OR 17X24 6PK STRL BLUE (TOWEL DISPOSABLE) ×4 IMPLANT
TOWEL OR 17X26 10 PK STRL BLUE (TOWEL DISPOSABLE) ×2 IMPLANT
TUBE CONNECTING 12X1/4 (SUCTIONS) IMPLANT
WATER STERILE IRR 1000ML POUR (IV SOLUTION) IMPLANT
YANKAUER SUCT BULB TIP NO VENT (SUCTIONS) IMPLANT

## 2011-09-12 NOTE — Progress Notes (Signed)
At discharge used language resource [name] at bedside to interpret. Pt reported severe pain with movement 8-9. With sitting still pt pain reported at an 4/10 Abd soft and non- tender. Notified Dr. Andrey Campanile. After notification pt determined pain was coming from pants that she had on being to tight. Obtained paper pants for patient. Re-notified Dr. Andrey Campanile per Dr. Andrey Campanile ok for discharge. Pt discharged in wheelchair pain 4/10.

## 2011-09-12 NOTE — Anesthesia Preprocedure Evaluation (Signed)
Anesthesia Evaluation  Patient identified by MRN, date of birth, ID band Patient awake, Patient confused and Patient unresponsive    Airway Mallampati: II  Neck ROM: full    Dental   Pulmonary shortness of breath, Current Smoker,          Cardiovascular +CHF     Neuro/Psych    GI/Hepatic   Endo/Other    Renal/GU      Musculoskeletal   Abdominal   Peds  Hematology   Anesthesia Other Findings   Reproductive/Obstetrics                           Anesthesia Physical Anesthesia Plan  ASA: II  Anesthesia Plan: General   Post-op Pain Management:    Induction: Intravenous  Airway Management Planned: Oral ETT  Additional Equipment:   Intra-op Plan:   Post-operative Plan:   Informed Consent: I have reviewed the patients History and Physical, chart, labs and discussed the procedure including the risks, benefits and alternatives for the proposed anesthesia with the patient or authorized representative who has indicated his/her understanding and acceptance.     Plan Discussed with: CRNA and Surgeon  Anesthesia Plan Comments:         Anesthesia Quick Evaluation

## 2011-09-12 NOTE — Anesthesia Postprocedure Evaluation (Signed)
Anesthesia Post Note  Patient: Eileen [name]  Procedure(s) Performed:  HERNIA REPAIR INGUINAL ADULT - open right inguinal hernia with mesh   Anesthesia type: General  Patient location: PACU  Post pain: Pain level controlled and Adequate analgesia  Post assessment: Post-op Vital signs reviewed, Patient's Cardiovascular Status Stable, Respiratory Function Stable, Patent Airway and Pain level controlled  Last Vitals:  Filed Vitals:   09/12/11 1240  BP:   Pulse:   Temp: 36.1 C  Resp:     Post vital signs: Reviewed and stable  Level of consciousness: awake, alert  and oriented  Complications: No apparent anesthesia complications

## 2011-09-12 NOTE — Progress Notes (Signed)
No family here. Has translator

## 2011-09-12 NOTE — Transfer of Care (Signed)
Immediate Anesthesia Transfer of Care Note  Patient: Eileen [name]  Procedure(s) Performed:  HERNIA REPAIR INGUINAL ADULT - open right inguinal hernia with mesh   Patient Location: PACU  Anesthesia Type: General  Level of Consciousness: awake, alert  and patient cooperative  Airway & Oxygen Therapy: Patient Spontanous Breathing and Patient connected to nasal cannula oxygen  Post-op Assessment: Report given to PACU RN  Post vital signs: Reviewed and stable Filed Vitals:   09/12/11 0850  BP: 99/67  Pulse: 65  Temp: 36.9 C  Resp: 18    Complications: No apparent anesthesia complications

## 2011-09-12 NOTE — Preoperative (Signed)
Beta Blockers   Reason not to administer Beta Blockers:Not Applicable 

## 2011-09-12 NOTE — Op Note (Signed)
Hernia, Open, Procedure Note  Indications: The patient presented with a history of a right, reducible hernia.    Pre-operative Diagnosis: right reducible inguinal hernia  Post-operative Diagnosis: right direct inguinal hernia  Surgeon: Atilano Ina   Assistants: none  Anesthesia: General endotracheal anesthesia  ASA Class: 2  Procedure Details  The patient was seen again in the Holding Room. The risks, benefits, complications, treatment options, and expected outcomes were discussed with the patient. The possibilities of reaction to medication, pulmonary aspiration, perforation of viscus, bleeding, recurrent infection, the need for additional procedures, and development of a complication requiring transfusion or further operation were discussed with the patient and/or family. There was concurrence with the proposed plan, and informed consent was obtained. The site of surgery was properly noted/marked. The patient was taken to the Operating Room, identified as Eileen [name], and the procedure verified as hernia repair. A Time Out was held and the above information confirmed.  The patient was placed in the supine position and underwent induction of anesthesia, the lower abdomen and groin was prepped and draped in the standard fashion, and 0.25% Marcaine with epinephrine was used to anesthetize the skin over the mid-portion of the inguinal canal. A transverse incision was made. Dissection was carried through the soft tissue to expose the inguinal canal and inguinal ligament along its lower edge. The external oblique fascia was split along the course of its fibers, exposing the inguinal canal. There was no evidence of a ligament in the canal. The nerve was identified and reflected out of the field. The patient had a direct defect. I re-approximated the floor with 2 interrupted 2-0 prolene sutures. A piece of [place] parietex pro-grip mesh was placed over the defect. A 2-0 Prolene suture was used  to anchor the mesh medially at the pubic tubercle. The mesh was placed into the canal without entrapment of the nerve.  The external oblique fashion was then closed in a continuous fashion using 2-0 Vicryl suture taking care not to cause entrapment. Scarpa's fascia was re-approximated with inverted interrupted 3-0 vicryl sutures. The skin was re-approximated with a running 4-0 monocryl subcuticular suture. Dermabond was applied to the skin.   Instrument, sponge, and needle counts were correct prior to closure and at the conclusion of the case.  Findings: Hernia as above  Estimated Blood Loss: Minimal         Drains: None                    Complications: None; patient tolerated the procedure well.         Disposition: PACU - hemodynamically stable.         Condition: stable  [name]. Andrey Campanile, MD, FACS General, Bariatric, & Minimally Invasive Surgery [facility], [place]

## 2011-09-13 ENCOUNTER — Encounter (HOSPITAL_COMMUNITY): Payer: Self-pay

## 2011-09-13 ENCOUNTER — Emergency Department (HOSPITAL_COMMUNITY)
Admission: EM | Admit: 2011-09-13 | Discharge: 2011-09-14 | Disposition: A | Discharge status: Home / Self Care | Payer: Self-pay | Attending: Emergency Medicine | Admitting: Emergency Medicine

## 2011-09-13 DIAGNOSIS — R338 Other retention of urine: Secondary | ICD-10-CM | POA: Insufficient documentation

## 2011-09-13 DIAGNOSIS — IMO0002 Reserved for concepts with insufficient information to code with codable children: Secondary | ICD-10-CM | POA: Insufficient documentation

## 2011-09-13 DIAGNOSIS — N9989 Other postprocedural complications and disorders of genitourinary system: Secondary | ICD-10-CM | POA: Insufficient documentation

## 2011-09-13 DIAGNOSIS — F17200 Nicotine dependence, unspecified, uncomplicated: Secondary | ICD-10-CM | POA: Insufficient documentation

## 2011-09-13 DIAGNOSIS — Z8719 Personal history of other diseases of the digestive system: Secondary | ICD-10-CM

## 2011-09-13 DIAGNOSIS — Y838 Other surgical procedures as the cause of abnormal reaction of the patient, or of later complication, without mention of misadventure at the time of the procedure: Secondary | ICD-10-CM | POA: Insufficient documentation

## 2011-09-13 DIAGNOSIS — R339 Retention of urine, unspecified: Secondary | ICD-10-CM

## 2011-09-13 LAB — URINALYSIS, ROUTINE W REFLEX MICROSCOPIC
Ketones, ur: NEGATIVE mg/dL
Leukocytes, UA: NEGATIVE
Nitrite: NEGATIVE
Protein, ur: NEGATIVE mg/dL
Urobilinogen, UA: 1 mg/dL (ref 0.0–1.0)

## 2011-09-13 LAB — BASIC METABOLIC PANEL
Calcium: 8.6 mg/dL (ref 8.4–10.5)
Creatinine, Ser: 0.76 mg/dL (ref 0.50–1.10)
GFR calc Af Amer: >90 mL/min (ref >90)
Sodium: 135 mEq/L (ref 135–145)

## 2011-09-13 MED FILL — Chlorhexidine Gluconate Liquid 4%: CUTANEOUS | Qty: 15 | Status: AC

## 2011-09-13 NOTE — ED Provider Notes (Signed)
History     CSN: [id number]  Arrival date & time 09/13/11  [place]   First MD Initiated Contact with Patient 09/13/11 2143      Chief Complaint  Patient presents with  . Post-op Problem    (Consider location/radiation/quality/duration/timing/severity/associated sxs/prior treatment) HPI Comments: Language barrier present.  Interpretor phone was used.  Patient's son was also in the room and translated.  Patient had right inguinal surgery repair done yesterday.  No complications during surgery.  She comes in today stating that she has been unable to void since yesterday shortly after the surgery.  She denies hematuria.  A Foley catheter was placed while she was in triage and at the time of my evaluation there was 1150 cc in the catheter bag.  She denies any nausea or vomiting.  Denies any fever, chills, cough.     The history is provided by the patient.    Past Medical History  Diagnosis Date  . Right inguinal hernia   . Arthritis   . CHF (congestive heart failure)     "unsure thinks lungs had fluid around after surgery 7 years ago"  . Chronic kidney disease     kidney infection  . Shortness of breath     Past Surgical History  Procedure Date  . Cesarean section   . Tubal ligation     Family History  Problem Relation Age of Onset  . Ulcers Father     stomach  . Diabetes Brother     History  Substance Use Topics  . Smoking status: Current Everyday Smoker -- 0.5 packs/day for 20 years    Types: Cigarettes  . Smokeless tobacco: Never Used  . Alcohol Use: No    OB History    Grav Para Term Preterm Abortions TAB SAB Ect Mult Living                  Review of Systems  Constitutional: Negative for fever and chills.  Respiratory: Negative for chest tightness and shortness of breath.   Cardiovascular: Negative for chest pain.  Genitourinary: Negative for hematuria.  Neurological: Negative for dizziness and syncope.    Allergies  Review of patient's allergies  indicates no known allergies.  Home Medications   Current Outpatient Rx  Name Route Sig Dispense Refill  . OXYCODONE-ACETAMINOPHEN 5-325 MG PO TABS Oral Take 1-2 tablets by mouth every 4 (four) hours as needed. For pain      BP 102/50  Pulse 81  Temp(Src) 99 F (37.2 C) (Oral)  Resp 23  SpO2 98%  LMP 08/25/2011  Physical Exam  Nursing note and vitals reviewed. Constitutional: She appears well-developed and well-nourished. No distress.  HENT:  Head: Normocephalic and atraumatic.  Cardiovascular: Normal rate and normal heart sounds.   Pulmonary/Chest: Effort normal and breath sounds normal. No respiratory distress. She has no wheezes.  Abdominal: Soft. Bowel sounds are normal.    Skin: She is not diaphoretic.    ED Course  Procedures (including critical care time)  Labs Reviewed - No data to display Dg Chest 2 View  09/12/2011  *RADIOLOGY REPORT*  Clinical Data: CHF  CHEST - 2 VIEW  Comparison: 10/03/2006  Findings: The heart size and mediastinal contours are within normal limits.  Both lungs are clear.  The visualized skeletal structures are unremarkable.  IMPRESSION: No acute findings identified.  Original Report Authenticated By: Rosealee Albee, M.D.     No diagnosis found.  11:29 PM Used interpretor phone and discussed discharge instructions  with patient.  MDM  Patient with post op urinary retention.  Foley catheter placed in patient while in the ED and she was discharge home with the catheter and instructed to follow up with Urology in a few days.  UA was normal.  Normal creatine.  Patient does not show any signs of infection.        Pascal Lux Wingen 09/14/11 0131

## 2011-09-13 NOTE — ED Notes (Signed)
Pt foley catheter changed to leg bag and regular bag given with verbal instructions of how to change. Son at bedside, pt and son verbalizes understanding on usage, Pt denies any further questions.

## 2011-09-13 NOTE — ED Notes (Signed)
Pt presents with increased pain to surgical site - hernia repair - and inability to void.  Pt last voided yesterday.

## 2011-09-14 ENCOUNTER — Encounter (HOSPITAL_COMMUNITY): Payer: Self-pay | Admitting: General Surgery

## 2011-09-14 NOTE — ED Provider Notes (Signed)
Medical screening examination/treatment/procedure(s) were performed by non-physician practitioner and as supervising physician I was immediately available for consultation/collaboration.   Juliet Rude. Rubin Payor, MD 09/14/11 [phone]

## 2011-09-15 ENCOUNTER — Telehealth (INDEPENDENT_AMBULATORY_CARE_PROVIDER_SITE_OTHER): Payer: Self-pay

## 2011-09-15 NOTE — Telephone Encounter (Signed)
The pt's 41 yr old daughter called this morning - the patient does not speak [place].  I finally understood that the pt has a Foley catheter that was placed in the ED.  She wanted it removed because it was uncomfortable.  Her discharge instructions had advised she follow up with Urology, but due to communication problems I advised them to return to the ED to have the catheter removed.

## 2011-09-16 ENCOUNTER — Emergency Department (HOSPITAL_COMMUNITY)
Admission: EM | Admit: 2011-09-16 | Discharge: 2011-09-16 | Disposition: A | Discharge status: Home / Self Care | Payer: Self-pay | Attending: Emergency Medicine | Admitting: Emergency Medicine

## 2011-09-16 ENCOUNTER — Encounter (HOSPITAL_COMMUNITY): Payer: Self-pay | Admitting: *Deleted

## 2011-09-16 DIAGNOSIS — F17200 Nicotine dependence, unspecified, uncomplicated: Secondary | ICD-10-CM | POA: Insufficient documentation

## 2011-09-16 DIAGNOSIS — R339 Retention of urine, unspecified: Secondary | ICD-10-CM | POA: Insufficient documentation

## 2011-09-16 DIAGNOSIS — R300 Dysuria: Secondary | ICD-10-CM | POA: Insufficient documentation

## 2011-09-16 DIAGNOSIS — Z9889 Other specified postprocedural states: Secondary | ICD-10-CM | POA: Insufficient documentation

## 2011-09-16 LAB — URINALYSIS, ROUTINE W REFLEX MICROSCOPIC
Glucose, UA: NEGATIVE mg/dL
Ketones, ur: NEGATIVE mg/dL
Protein, ur: NEGATIVE mg/dL

## 2011-09-16 NOTE — ED Provider Notes (Signed)
History     CSN: [id number]  Arrival date & time 09/16/11  1113   First MD Initiated Contact with Patient 09/16/11 1135      Chief Complaint  Patient presents with  . Urinary Retention    (Consider location/radiation/quality/duration/timing/severity/associated sxs/prior treatment) HPI Patient is a 41 year old female who is status post hernia repair and presents today for evaluation and possible removal of her Foley catheter. Patient was seen in this emergency Department Wednesday with urinary retention. At that time Foley was placed with leg bag. Patient was told to followup with the urologist for removal. Patient is Spanish-speaking only. She reports today stating that she understands that this was supposed to be the plan. However, she would like [place] to try and remove it today as it has been bothering her significantly. Patient denies any abdominal pain aside from expected postoperative pain. She denies any nausea, vomiting, or fevers. There are no other associated or modifying factors. Of note, interview is conducted by myself in [place]. Patient stated she was comfortable with my interpretation. Past Medical History  Diagnosis Date  . Right inguinal hernia   . Arthritis   . CHF (congestive heart failure)     "unsure thinks lungs had fluid around after surgery 7 years ago"  . Chronic kidney disease     kidney infection  . Shortness of breath     Past Surgical History  Procedure Date  . Cesarean section   . Tubal ligation   . Inguinal hernia repair 09/12/2011    Procedure: HERNIA REPAIR INGUINAL ADULT;  Surgeon: Atilano Ina, MD;  Location: [facility] OR;  Service: General;  Laterality: Right;  open right inguinal hernia with mesh     Family History  Problem Relation Age of Onset  . Ulcers Father     stomach  . Diabetes Brother     History  Substance Use Topics  . Smoking status: Current Everyday Smoker -- 0.5 packs/day for 20 years    Types: Cigarettes  . Smokeless tobacco:  Never Used  . Alcohol Use: No    OB History    Grav Para Term Preterm Abortions TAB SAB Ect Mult Living                  Review of Systems  Constitutional: Negative.   HENT: Negative.   Eyes: Negative.   Respiratory: Negative.   Cardiovascular: Negative.   Gastrointestinal: Positive for abdominal pain.       Postoperative pain improving  Genitourinary:       See history of present illness  Musculoskeletal: Negative.   Skin: Negative.   Neurological: Negative.   Hematological: Negative.   Psychiatric/Behavioral: Negative.   All other systems reviewed and are negative.    Allergies  Review of patient's allergies indicates no known allergies.  Home Medications   Current Outpatient Rx  Name Route Sig Dispense Refill  . OXYCODONE-ACETAMINOPHEN 5-325 MG PO TABS Oral Take 1-2 tablets by mouth every 4 (four) hours as needed. For pain      BP 95/66  Pulse 67  Temp(Src) 97.4 F (36.3 C) (Oral)  Resp 18  SpO2 98%  LMP 08/25/2011  Physical Exam  Nursing note and vitals reviewed. Constitutional: She is oriented to person, place, and time. She appears well-developed and well-nourished. No distress.  HENT:  Head: Normocephalic and atraumatic.  Eyes: Conjunctivae and EOM are normal. Pupils are equal, round, and reactive to light.  Neck: Normal range of motion.  Cardiovascular: Normal rate, regular  rhythm, normal heart sounds and intact distal pulses.  Exam reveals no gallop and no friction rub.   No murmur heard. Pulmonary/Chest: Effort normal and breath sounds normal. No respiratory distress. She has no wheezes. She has no rales.  Abdominal: Soft. Bowel sounds are normal. She exhibits no distension. There is tenderness. There is no rebound and no guarding.       Turner palpation only a prior surgical site. Site is clean dry and intact. Patient states that his tenderness is in line with what she's been experiencing and is actually improving  Genitourinary:       Foley in  place  Musculoskeletal: Normal range of motion.  Neurological: She is alert and oriented to person, place, and time. No cranial nerve deficit. She exhibits normal muscle tone. Coordination normal.  Skin: Skin is warm and dry. No rash noted.  Psychiatric: She has a normal mood and affect.    ED Course  Procedures (including critical care time)  Labs Reviewed  URINALYSIS, ROUTINE W REFLEX MICROSCOPIC - Abnormal; Notable for the following:    Hgb urine dipstick TRACE (*)    Leukocytes, UA TRACE (*)    All other components within normal limits  URINE MICROSCOPIC-ADD ON   No results found.   1. Foley catheter status   2. Dysuria       MDM  Patient presented and was mainly having discomfort from her Foley catheter. We discussed that she was supposed to be seen by urology for removal of this. Given the level of discomfort she was having with it she really wanted to try removal today. We agreed that I would remove this and if she was unable to urinate he would have to be replaced. Foley was removed. Patient was able to eat urinate at least 2 times while here. Urine sample was obtained and showed no signs of infection. Patient was able to be discharged home in good condition. She was told that if she had asymptomatic inability to urinate over her 8 hours for symptomatic inability to urinate over 4 hours that she should return for further evaluation and possible replacement of her Foley.        Cyndra Numbers, MD 09/16/11 [phone]

## 2011-09-16 NOTE — ED Notes (Signed)
Pt reports having hernia surgery on tues, came here on wed due to not being able to urinate, they placed a foley cath in and pt here to have it dc.

## 2011-09-19 ENCOUNTER — Telehealth (INDEPENDENT_AMBULATORY_CARE_PROVIDER_SITE_OTHER): Payer: Self-pay | Admitting: General Surgery

## 2011-09-19 NOTE — Telephone Encounter (Signed)
Can you please let patient know to stop taking this medicine. If the shortness of breath becomes severe enough she may need to be seen in the emergency room. She can try tylenol or ibuprofen in the place of the pain medicine and if this does not adequately help with the pain she needs to call back and we will ask Dr Andrey Campanile to prescribe something different. I made a follow up appt for patient on 09/26/11 at 10:30am. Please have patient call if she continues to have problems or if this is not a good appt date/time. Thanks.

## 2011-09-19 NOTE — Telephone Encounter (Signed)
Pt needs 2wk po, Dr. Julien Nordmann soonest available is 11/10/11.  Patient speaks spanish.  She also states that he pain medication is causing her to have hallucinations and shortness of breath, does she stop taking them?  If so, what can she take for pain?

## 2011-09-19 NOTE — Telephone Encounter (Signed)
Called patient, left a message on her voicemail, giving her the instructions that you have indicated.  Left my direct number, [phone], if she had any questions.

## 2011-09-21 ENCOUNTER — Ambulatory Visit (HOSPITAL_BASED_OUTPATIENT_CLINIC_OR_DEPARTMENT_OTHER): Admission: RE | Admit: 2011-09-21 | Payer: Self-pay | Source: Ambulatory Visit | Admitting: General Surgery

## 2011-09-21 ENCOUNTER — Encounter (HOSPITAL_BASED_OUTPATIENT_CLINIC_OR_DEPARTMENT_OTHER): Admission: RE | Payer: Self-pay | Source: Ambulatory Visit

## 2011-09-21 SURGERY — REPAIR, HERNIA, INGUINAL, ADULT
Anesthesia: General | Laterality: Right

## 2011-09-26 ENCOUNTER — Encounter (INDEPENDENT_AMBULATORY_CARE_PROVIDER_SITE_OTHER): Payer: Self-pay | Admitting: General Surgery

## 2011-09-26 ENCOUNTER — Ambulatory Visit (INDEPENDENT_AMBULATORY_CARE_PROVIDER_SITE_OTHER): Payer: Self-pay | Admitting: General Surgery

## 2011-09-26 VITALS — BP 114/68 | HR 78 | Temp 98.8°F | Ht 61.0 in | Wt 147.6 lb

## 2011-09-26 DIAGNOSIS — Z09 Encounter for follow-up examination after completed treatment for conditions other than malignant neoplasm: Secondary | ICD-10-CM

## 2011-09-26 MED ORDER — HYDROCODONE-ACETAMINOPHEN 10-325 MG PO TABS
1.0000 | ORAL_TABLET | Freq: Every day | ORAL | Status: AC
Start: 2011-09-26 — End: 2012-09-25

## 2011-09-26 MED ORDER — TAMSULOSIN HCL 0.4 MG PO CAPS: 0.4000 mg | ORAL_CAPSULE | Freq: Every day | ORAL | Status: DC

## 2011-09-26 NOTE — Patient Instructions (Signed)
You need to avoid lifting, pushing, or pulling anything greater than 15 pounds for another 4 weeks

## 2011-09-26 NOTE — Progress Notes (Signed)
Chief complaint: Postop  Procedure: Status post open repair of right direct inguinal hernia with mesh January 15  History of Present Ilness: 41 year old Hispanic female comes in for followup. She ended up going to the emergency room the day after surgery for urinary retention. A urinary catheter was placed in the emergency department and over 1 L of urine was drained. She was sent with the urinary catheter. There is no evidence of tract infection. She re\presented to the emergency room 2 days later because of discomfort due to the Foley. A repeat urinalysis demonstrated no urinary tract infection. The ED attending had encouraged the patient to followup with urology. But because of her discomfort around the catheter, it was relieved and the patient was able to spontaneously void twice in the emergency department. She also reports that she had some problems with Percocet. She states that it made her hallucinate.  She denies any fever, chills, nausea, vomiting, diarrhea or constipation. She denies any dysuria. She states that her urinary stream is still slow and dribbles. She still complains of discomfort in her right groin region. She also states that she has numbness in her right groin. She states that the discomfort in her right groin has improved since surgery.  Physical Exam: BP 114/68  Pulse 78  Temp(Src) 98.8 F (37.1 C) (Temporal)  Ht 5\' 1"  (1.549 m)  Wt 147 lb 9.6 oz (66.951 kg)  BMI 27.89 kg/m2  SpO2 97%  LMP 08/25/2011  Gen: alert, NAD, non-toxic appearing Pulm: Lungs clear to auscultation, symmetric chest rise CV: regular rate and rhythm Abd: soft, nontender, nondistended. GU: Rt groin incision - c/d/i. No cellulitis. No hematoma. Mild TTP. no sign of hernia recurrence Ext: no edema    Assessment and Plan: Status post open repair of right direct inguinal hernia with mesh with postoperative urinary retention.  I encouraged her that she needed to avoid strenuous activity for  another 4 weeks. Because of her ongoing urinary issues, I gave her prescription for Flomax. If this does not help with her urine stream then she will need a referral to a urologist.  She was given a prescription for Norco. I explained that the pain and numbness should improve.  She will follow up in 6 weeks. This interview was done with the aid of a translator  [name]. Andrey Campanile, MD, FACS General, Bariatric, & Minimally Invasive Surgery [facility], [place]

## 2011-11-15 ENCOUNTER — Encounter (INDEPENDENT_AMBULATORY_CARE_PROVIDER_SITE_OTHER): Payer: Self-pay | Admitting: General Surgery

## 2011-11-15 ENCOUNTER — Ambulatory Visit (INDEPENDENT_AMBULATORY_CARE_PROVIDER_SITE_OTHER): Payer: PRIVATE HEALTH INSURANCE | Admitting: General Surgery

## 2011-11-15 VITALS — BP 122/72 | HR 60 | Temp 97.0°F | Resp 16 | Ht 61.0 in | Wt 146.4 lb

## 2011-11-15 DIAGNOSIS — Z09 Encounter for follow-up examination after completed treatment for conditions other than malignant neoplasm: Secondary | ICD-10-CM

## 2011-11-15 NOTE — Progress Notes (Signed)
Chief complaint: Postop  Procedure: Status post open repair of right inguinal hernia January 15  History of Present Ilness: 41 year old Hispanic female comes in for her second postoperative appointment. I last saw her on January 29. She states overall she is doing well. She denies any fever, chills, nausea, vomiting, diarrhea, constipation. She is no longer having any urinary issues. She still has a little bit of numbness in inferior to her incision. She states that it is getting better. She also relates some intermittent pain superior and lateral to her incision. It generally only occurs after working or standing for more than 6 hours. It is not as bad as it was several weeks  Physical Exam: BP 122/72  Pulse 60  Temp(Src) 97 F (36.1 C) (Temporal)  Resp 16  Ht 5\' 1"  (1.549 m)  Wt 146 lb 6 oz (66.395 kg)  BMI 27.66 kg/m2  Gen: alert, NAD, non-toxic appearing Pulm: Lungs clear to auscultation, symmetric chest rise CV: regular rate and rhythm Abd: soft, nontender, nondistended. No incisional hernia GU: well healed Rt groin incision. No hematoma/swelling. Non tender.  no sign of hernia recurrence Ext: no edema   Assessment and Plan: Status post open repair of right inguinal hernia-overall doing well.  I've released her to full activity. I explained that the intermittent discomfort should get better with more time. Followup p.r.n. [name]. Andrey Campanile, MD, FACS General, Bariatric, & Minimally Invasive Surgery [facility], [place]

## 2013-04-04 ENCOUNTER — Emergency Department (HOSPITAL_COMMUNITY): Payer: Self-pay

## 2013-04-04 ENCOUNTER — Encounter (HOSPITAL_COMMUNITY): Payer: Self-pay | Admitting: Emergency Medicine

## 2013-04-04 ENCOUNTER — Emergency Department (HOSPITAL_COMMUNITY)
Admission: EM | Admit: 2013-04-04 | Discharge: 2013-04-04 | Disposition: A | Discharge status: Home / Self Care | Payer: Self-pay | Attending: Emergency Medicine | Admitting: Emergency Medicine

## 2013-04-04 DIAGNOSIS — R0789 Other chest pain: Secondary | ICD-10-CM | POA: Insufficient documentation

## 2013-04-04 DIAGNOSIS — F17200 Nicotine dependence, unspecified, uncomplicated: Secondary | ICD-10-CM | POA: Insufficient documentation

## 2013-04-04 DIAGNOSIS — R112 Nausea with vomiting, unspecified: Secondary | ICD-10-CM | POA: Insufficient documentation

## 2013-04-04 DIAGNOSIS — R197 Diarrhea, unspecified: Secondary | ICD-10-CM | POA: Insufficient documentation

## 2013-04-04 DIAGNOSIS — N189 Chronic kidney disease, unspecified: Secondary | ICD-10-CM | POA: Insufficient documentation

## 2013-04-04 DIAGNOSIS — R0602 Shortness of breath: Secondary | ICD-10-CM | POA: Insufficient documentation

## 2013-04-04 DIAGNOSIS — I509 Heart failure, unspecified: Secondary | ICD-10-CM | POA: Insufficient documentation

## 2013-04-04 DIAGNOSIS — M129 Arthropathy, unspecified: Secondary | ICD-10-CM | POA: Insufficient documentation

## 2013-04-04 DIAGNOSIS — B349 Viral infection, unspecified: Secondary | ICD-10-CM

## 2013-04-04 DIAGNOSIS — Z8719 Personal history of other diseases of the digestive system: Secondary | ICD-10-CM | POA: Insufficient documentation

## 2013-04-04 DIAGNOSIS — Z79899 Other long term (current) drug therapy: Secondary | ICD-10-CM | POA: Insufficient documentation

## 2013-04-04 DIAGNOSIS — B9789 Other viral agents as the cause of diseases classified elsewhere: Secondary | ICD-10-CM | POA: Insufficient documentation

## 2013-04-04 DIAGNOSIS — R1013 Epigastric pain: Secondary | ICD-10-CM | POA: Insufficient documentation

## 2013-04-04 LAB — CBC WITH DIFFERENTIAL/PLATELET
Eosinophils Relative: 2 % (ref 0–5)
HCT: 35.2 % — ABNORMAL LOW (ref 36.0–46.0)
Lymphocytes Relative: 50 % — ABNORMAL HIGH (ref 12–46)
Lymphs Abs: 3.5 10*3/uL (ref 0.7–4.0)
MCV: 88 fL (ref 78.0–100.0)
Monocytes Absolute: 0.8 10*3/uL (ref 0.1–1.0)
RDW: 13.7 % (ref 11.5–15.5)
WBC: 6.9 10*3/uL (ref 4.0–10.5)

## 2013-04-04 LAB — COMPREHENSIVE METABOLIC PANEL
ALT: 11 U/L (ref 0–35)
AST: 14 U/L (ref 0–37)
Albumin: 3.5 g/dL (ref 3.5–5.2)
Alkaline Phosphatase: 68 U/L (ref 39–117)
Chloride: 102 mEq/L (ref 96–112)
Potassium: 3.5 mEq/L (ref 3.5–5.1)
Sodium: 136 mEq/L (ref 135–145)
Total Bilirubin: 0.5 mg/dL (ref 0.3–1.2)
Total Protein: 6.5 g/dL (ref 6.0–8.3)

## 2013-04-04 MED ORDER — PROMETHAZINE HCL 25 MG PO TABS: 25.0000 mg | ORAL_TABLET | Freq: Four times a day (QID) | ORAL | Status: DC | PRN

## 2013-04-04 MED ORDER — HYDROCODONE-ACETAMINOPHEN 5-325 MG PO TABS: 1.0000 | ORAL_TABLET | ORAL | Status: DC | PRN

## 2013-04-04 MED ORDER — OMEPRAZOLE 20 MG PO CPDR: 20.0000 mg | DELAYED_RELEASE_CAPSULE | Freq: Every day | ORAL | Status: DC

## 2013-04-04 MED ORDER — ALBUTEROL SULFATE HFA 108 (90 BASE) MCG/ACT IN AERS
2.0000 | INHALATION_SPRAY | RESPIRATORY_TRACT | Status: DC | PRN
  Administered 2013-04-04: 2 via RESPIRATORY_TRACT
  Filled 2013-04-04: qty 6.7

## 2013-04-04 MED ORDER — ONDANSETRON 4 MG PO TBDP
8.0000 mg | ORAL_TABLET | Freq: Once | ORAL | Status: AC
  Administered 2013-04-04: 8 mg via ORAL
  Filled 2013-04-04: qty 2

## 2013-04-04 MED ORDER — PROMETHAZINE HCL 25 MG/ML IJ SOLN
25.0000 mg | Freq: Once | INTRAMUSCULAR | Status: AC
  Administered 2013-04-04: 25 mg via INTRAMUSCULAR
  Filled 2013-04-04: qty 1

## 2013-04-04 MED ORDER — PSEUDOEPHEDRINE HCL ER 120 MG PO TB12
120.0000 mg | ORAL_TABLET | Freq: Once | ORAL | Status: AC
  Administered 2013-04-04: 120 mg via ORAL
  Filled 2013-04-04: qty 1

## 2013-04-04 MED ORDER — PANTOPRAZOLE SODIUM 40 MG PO TBEC
40.0000 mg | DELAYED_RELEASE_TABLET | Freq: Once | ORAL | Status: AC
  Administered 2013-04-04: 40 mg via ORAL
  Filled 2013-04-04: qty 1

## 2013-04-04 MED ORDER — GI COCKTAIL ~~LOC~~
30.0000 mL | Freq: Once | ORAL | Status: AC
  Administered 2013-04-04: 30 mL via ORAL
  Filled 2013-04-04: qty 30

## 2013-04-04 NOTE — ED Provider Notes (Signed)
CSN: [id number]     Arrival date & time 04/04/13  0005 History     None    Chief Complaint  Patient presents with  . Cough   (Consider location/radiation/quality/duration/timing/severity/associated sxs/prior Treatment) HPI History provided by pt and her daughter who is translating.  Pt presents w/ multiple complaints.  Has had a productive cough for the past 2 months.  It has worsened in the last 3 days and is now associated w/ SOB and mid-line CP.  These sx are worst at night and are non-exertional.  No RF for ACS.  Denies fever and hemoptysis, but has had sore throat and nasal congestion.  Also c/o epigastric pain and N/V/D x 3 days.  Denies urinary and vaginal sx.  Is not tolerating pos.  No known sick contacts.  Does not drink alcohol and has not recently taken any NSAIDs. Past Medical History  Diagnosis Date  . Right inguinal hernia   . Arthritis   . CHF (congestive heart failure)     "unsure thinks lungs had fluid around after surgery 7 years ago"  . Chronic kidney disease     kidney infection  . Shortness of breath    Past Surgical History  Procedure Laterality Date  . Cesarean section    . Tubal ligation    . Inguinal hernia repair  09/12/2011    Procedure: HERNIA REPAIR INGUINAL ADULT;  Surgeon: Atilano Ina, MD;  Location: [facility] OR;  Service: General;  Laterality: Right;  open right inguinal hernia with mesh   . Hernia repair  09/12/11    RIH   Family History  Problem Relation Age of Onset  . Ulcers Father     stomach  . Diabetes Brother    History  Substance Use Topics  . Smoking status: Current Every Day Smoker -- 0.50 packs/day for 20 years    Types: Cigarettes  . Smokeless tobacco: Never Used  . Alcohol Use: No   OB History   Grav Para Term Preterm Abortions TAB SAB Ect Mult Living                 Review of Systems  All other systems reviewed and are negative.    Allergies  Review of patient's allergies indicates no known allergies.  Home Medications    Current Outpatient Rx  Name  Route  Sig  Dispense  Refill  . Nutritional Supplements (COLD AND FLU PO)   Oral   Take 2 tablets by mouth every 6 (six) hours as needed (for cold/cough/flu symptoms).         Marland Kitchen HYDROcodone-acetaminophen (NORCO/VICODIN) 5-325 MG per tablet   Oral   Take 1 tablet by mouth every 4 (four) hours as needed for pain.   12 tablet   0   . omeprazole (PRILOSEC) 20 MG capsule   Oral   Take 1 capsule (20 mg total) by mouth daily.   14 capsule   0   . promethazine (PHENERGAN) 25 MG tablet   Oral   Take 1 tablet (25 mg total) by mouth every 6 (six) hours as needed for nausea.   20 tablet   0    BP 122/68  Pulse 70  Temp(Src) 98.2 F (36.8 C) (Oral)  Resp 14  SpO2 97%  LMP 03/27/2013 Physical Exam  Nursing note and vitals reviewed. Constitutional: She is oriented to person, place, and time. She appears well-developed and well-nourished. No distress.  HENT:  Head: Normocephalic and atraumatic.  Mouth/Throat: Oropharynx is  clear and moist.  Posterior pharynx w/out erythema.  No tonsillar edema or exudate.  Uvula mid-line.  No trismus.    Eyes:  Normal appearance  Neck: Normal range of motion.  Cardiovascular: Normal rate and regular rhythm.   Pulmonary/Chest: Effort normal and breath sounds normal. No respiratory distress.  Mild tenderness at LSB  Abdominal: Soft. Bowel sounds are normal. She exhibits no distension and no mass. There is no rebound and no guarding.  Epigastric ttp  Genitourinary:  No CVA tenderness  Musculoskeletal: Normal range of motion.  Lymphadenopathy:    She has no cervical adenopathy.  Neurological: She is alert and oriented to person, place, and time.  Skin: Skin is warm and dry. No rash noted.  Psychiatric: She has a normal mood and affect. Her behavior is normal.    ED Course   Procedures (including critical care time)  Date: 04/04/2013  Rate: 63  Rhythm: normal sinus rhythm  QRS Axis: normal  Intervals:  normal  ST/T Wave abnormalities: normal  Conduction Disutrbances:none  Narrative Interpretation:   Old EKG Reviewed: none available   Labs Reviewed - No data to display Dg Chest 2 View  04/04/2013   *RADIOLOGY REPORT*  Clinical Data: Cough congestion  CHEST - 2 VIEW  Comparison: 09/12/2011  Findings: Normal heart size and vascularity.  No focal pneumonia, collapse, or consolidation.  No effusion, edema, or pneumothorax. Trachea midline.  Mild scoliosis of the spine.  Stable exam.  IMPRESSION: No acute finding   Original Report Authenticated By: Judie Petit. Shick, M.D.   1. Viral syndrome     MDM  Healthy 42yo F presents w/ cough, nasal congestion and sore throat x 2 months, worse over last 3 days and now associated w/ CP and SOB, as well as epigastric pain, N/V/D.   On exam, afebrile, non-toxic appearing, nasal congestion but otherwise nml ENT, nml breath sounds, coughing, abd benign w/ exception of mild epigastric ttp.  EKG ordered d/t complaint of CP/SOB, but characteristics of pain are not consistent w/ ACS, nor does patient have any risk factors.  No ischemic changes.  Likely muscular from coughing or esophagitis.  Pt received an albuterol inhaler, sudafed, protonix and zofran.  Did not pass po challenge.  Labs obtained and are unremarkable. Ttreated w/ IM phenergan and GI cocktail.  Currently asymptomatic, tolerating pos.  Suspect viral syndrome. Prescribed phenergan, prilosec and vicodin (for pain and cough suppression).  Recommended sudafed and/or afrin, imodium for severe diarrhea and plenty of fluids.  Return precautions discussed.  Stable at time of discharge.  5:44 AM   Otilio Miu, PA-C 04/04/13 [phone]

## 2013-04-04 NOTE — ED Notes (Signed)
PT. REPORTS PERSISTENT COUGH , CHEST CONGESTION , NAUSEA AND NASAL CONGESTION FOR SEVERAL DAYS , DENIES FEVER OR CHILLS .

## 2013-04-05 NOTE — ED Provider Notes (Signed)
Medical screening examination/treatment/procedure(s) were performed by non-physician practitioner and as supervising physician I was immediately available for consultation/collaboration.  Jevaughn Degollado, MD 04/05/13 0004 

## 2013-04-18 ENCOUNTER — Encounter (HOSPITAL_COMMUNITY): Payer: Self-pay | Admitting: Emergency Medicine

## 2013-04-18 DIAGNOSIS — Z8709 Personal history of other diseases of the respiratory system: Secondary | ICD-10-CM | POA: Insufficient documentation

## 2013-04-18 DIAGNOSIS — Z79899 Other long term (current) drug therapy: Secondary | ICD-10-CM | POA: Insufficient documentation

## 2013-04-18 DIAGNOSIS — J40 Bronchitis, not specified as acute or chronic: Secondary | ICD-10-CM | POA: Insufficient documentation

## 2013-04-18 DIAGNOSIS — R05 Cough: Secondary | ICD-10-CM | POA: Insufficient documentation

## 2013-04-18 DIAGNOSIS — I509 Heart failure, unspecified: Secondary | ICD-10-CM | POA: Insufficient documentation

## 2013-04-18 DIAGNOSIS — M129 Arthropathy, unspecified: Secondary | ICD-10-CM | POA: Insufficient documentation

## 2013-04-18 DIAGNOSIS — N189 Chronic kidney disease, unspecified: Secondary | ICD-10-CM | POA: Insufficient documentation

## 2013-04-18 DIAGNOSIS — F17200 Nicotine dependence, unspecified, uncomplicated: Secondary | ICD-10-CM | POA: Insufficient documentation

## 2013-04-18 DIAGNOSIS — Z8719 Personal history of other diseases of the digestive system: Secondary | ICD-10-CM | POA: Insufficient documentation

## 2013-04-18 DIAGNOSIS — R059 Cough, unspecified: Secondary | ICD-10-CM | POA: Insufficient documentation

## 2013-04-18 NOTE — ED Notes (Signed)
Patient with chest pain that started 21 days ago.  Patient was seen then, given some meds, now she is out of meds.

## 2013-04-19 ENCOUNTER — Emergency Department (HOSPITAL_COMMUNITY)
Admit: 2013-04-19 | Discharge: 2013-04-19 | Disposition: A | Discharge status: Home / Self Care | Payer: Self-pay | Attending: Emergency Medicine | Admitting: Emergency Medicine

## 2013-04-19 ENCOUNTER — Emergency Department (HOSPITAL_COMMUNITY)
Admission: EM | Admit: 2013-04-19 | Discharge: 2013-04-19 | Disposition: A | Discharge status: Home / Self Care | Payer: Self-pay | Attending: Emergency Medicine | Admitting: Emergency Medicine

## 2013-04-19 DIAGNOSIS — J40 Bronchitis, not specified as acute or chronic: Secondary | ICD-10-CM

## 2013-04-19 LAB — CBC
HCT: 37.3 % (ref 36.0–46.0)
Hemoglobin: 13.2 g/dL (ref 12.0–15.0)
MCH: 31.3 pg (ref 26.0–34.0)
MCHC: 35.4 g/dL (ref 30.0–36.0)
MCV: 88.4 fL (ref 78.0–100.0)
RDW: 14.1 % (ref 11.5–15.5)

## 2013-04-19 LAB — D-DIMER, QUANTITATIVE: D-Dimer, Quant: <0.27 ug/mL-FEU (ref 0.00–0.48)

## 2013-04-19 LAB — BASIC METABOLIC PANEL
BUN: 14 mg/dL (ref 6–23)
Calcium: 9.1 mg/dL (ref 8.4–10.5)
Creatinine, Ser: 0.7 mg/dL (ref 0.50–1.10)
GFR calc non Af Amer: >90 mL/min (ref >90)
Glucose, Bld: 102 mg/dL — ABNORMAL HIGH (ref 70–99)
Potassium: 3.5 mEq/L (ref 3.5–5.1)

## 2013-04-19 LAB — POCT I-STAT TROPONIN I: Troponin i, poc: 0.01 ng/mL (ref 0.00–0.08)

## 2013-04-19 MED ORDER — ALBUTEROL SULFATE HFA 108 (90 BASE) MCG/ACT IN AERS: 1.0000 | INHALATION_SPRAY | Freq: Four times a day (QID) | RESPIRATORY_TRACT | Status: DC | PRN

## 2013-04-19 MED ORDER — PREDNISONE 20 MG PO TABS: ORAL_TABLET | ORAL | Status: DC

## 2013-04-19 MED ORDER — LORATADINE 10 MG PO TABS: 10.0000 mg | ORAL_TABLET | Freq: Every day | ORAL | Status: DC

## 2013-04-19 NOTE — ED Provider Notes (Addendum)
CSN: [id number]     Arrival date & time 04/18/13  2325 History     First MD Initiated Contact with Patient 04/19/13 0100     Chief Complaint  Patient presents with  . Chest Pain   (Consider location/radiation/quality/duration/timing/severity/associated sxs/prior Treatment) Patient is a 42 y.o. female presenting with chest pain. The history is provided by the patient. No language interpreter was used.  Chest Pain Pain location:  Substernal area Pain quality: sharp   Pain radiates to:  Does not radiate Pain radiates to the back: no   Pain severity:  Moderate Onset quality:  Gradual Duration:  4 weeks Timing:  Constant Progression:  Unchanged Chronicity:  Chronic Context: no stress   Relieved by:  Nothing Worsened by:  Nothing tried Ineffective treatments:  None tried Associated symptoms: no abdominal pain   Risk factors: smoking   Risk factors: no aortic disease   cough   Past Medical History  Diagnosis Date  . Right inguinal hernia   . Arthritis   . CHF (congestive heart failure)     "unsure thinks lungs had fluid around after surgery 7 years ago"  . Chronic kidney disease     kidney infection  . Shortness of breath    Past Surgical History  Procedure Laterality Date  . Cesarean section    . Tubal ligation    . Inguinal hernia repair  09/12/2011    Procedure: HERNIA REPAIR INGUINAL ADULT;  Surgeon: Atilano Ina, MD;  Location: [facility] OR;  Service: General;  Laterality: Right;  open right inguinal hernia with mesh   . Hernia repair  09/12/11    RIH   Family History  Problem Relation Age of Onset  . Ulcers Father     stomach  . Diabetes Brother    History  Substance Use Topics  . Smoking status: Current Every Day Smoker -- 0.50 packs/day for 20 years    Types: Cigarettes  . Smokeless tobacco: Never Used  . Alcohol Use: No   OB History   Grav Para Term Preterm Abortions TAB SAB Ect Mult Living                 Review of Systems  Cardiovascular: Positive for  chest pain.  Gastrointestinal: Negative for abdominal pain.  All other systems reviewed and are negative.    Allergies  Review of patient's allergies indicates no known allergies.  Home Medications   Current Outpatient Rx  Name  Route  Sig  Dispense  Refill  . HYDROcodone-acetaminophen (NORCO/VICODIN) 5-325 MG per tablet   Oral   Take 1 tablet by mouth every 4 (four) hours as needed for pain.   12 tablet   0   . omeprazole (PRILOSEC) 20 MG capsule   Oral   Take 1 capsule (20 mg total) by mouth daily.   14 capsule   0   . promethazine (PHENERGAN) 25 MG tablet   Oral   Take 1 tablet (25 mg total) by mouth every 6 (six) hours as needed for nausea.   20 tablet   0    BP 127/79  Pulse 71  Temp(Src) 98 F (36.7 C) (Oral)  Resp 20  SpO2 95%  LMP 03/27/2013 Physical Exam  Constitutional: She is oriented to person, place, and time. She appears well-developed and well-nourished. No distress.  HENT:  Head: Normocephalic and atraumatic.  Mouth/Throat: Oropharynx is clear and moist.  Eyes: Conjunctivae are normal. Pupils are equal, round, and reactive to light.  Neck:  Normal range of motion. Neck supple.  Cardiovascular: Normal rate, regular rhythm and intact distal pulses.   Pulmonary/Chest: Effort normal and breath sounds normal. She has no wheezes. She has no rales.  Abdominal: Soft. Bowel sounds are normal. There is no tenderness. There is no rebound and no guarding.  Musculoskeletal: Normal range of motion.  Neurological: She is alert and oriented to person, place, and time.  Skin: Skin is warm.  Psychiatric: She has a normal mood and affect.    ED Course   Procedures (including critical care time)  Labs Reviewed  BASIC METABOLIC PANEL - Abnormal; Notable for the following:    Glucose, Bld 102 (*)    All other components within normal limits  CBC  D-DIMER, QUANTITATIVE  POCT I-STAT TROPONIN I   Dg Chest 2 View  04/19/2013   CLINICAL DATA:  Chest pain and  cough.  EXAM: CHEST  2 VIEW  COMPARISON:  04/04/2013.  FINDINGS: Normal sized heart. Clear lungs. Mild diffuse peribronchial thickening with mild progression. Stable mild scoliosis.  IMPRESSION: Mild bronchitic changes with mild progression.   Electronically Signed   By: Gordan Payment   On: 04/19/2013 00:35   No diagnosis found.  MDM  Smoker's cough and bronchitis will treat  Date: 04/19/2013  Rate: 76  Rhythm: normal sinus rhythm  QRS Axis: normal  Intervals: normal  ST/T Wave abnormalities: normal  Conduction Disutrbances: none  Narrative Interpretation: unremarkable     Ece Cumberland K Carrieann Spielberg-Rasch, MD 04/19/13 0218  Jasmine Awe, MD 04/19/13 Earle Gell

## 2013-04-19 NOTE — ED Notes (Addendum)
Main lab needs a new labeled lt. green tube.(now lt. green tube not needed as of 0032, per KLM, EMT)

## 2013-04-19 NOTE — ED Notes (Signed)
The pt is c/o a cold and cough for 3-4 months.  She vomits whenever the sputum get caught in her throat.  No distress.  Family at bedside interpreting for the pt.

## 2013-05-01 ENCOUNTER — Encounter: Payer: Self-pay | Admitting: Internal Medicine

## 2013-05-01 ENCOUNTER — Ambulatory Visit: Payer: Self-pay | Attending: Internal Medicine | Admitting: Internal Medicine

## 2013-05-01 VITALS — BP 102/72 | HR 69 | Temp 98.2°F | Resp 16 | Ht 61.42 in | Wt 160.0 lb

## 2013-05-01 DIAGNOSIS — R112 Nausea with vomiting, unspecified: Secondary | ICD-10-CM

## 2013-05-01 DIAGNOSIS — R1013 Epigastric pain: Secondary | ICD-10-CM | POA: Insufficient documentation

## 2013-05-01 DIAGNOSIS — K3189 Other diseases of stomach and duodenum: Secondary | ICD-10-CM

## 2013-05-01 MED ORDER — OMEPRAZOLE 40 MG PO CPDR: 40.0000 mg | DELAYED_RELEASE_CAPSULE | Freq: Every day | ORAL | Status: DC

## 2013-05-01 NOTE — Progress Notes (Signed)
HFU for Bronchitis. Pt is still experiencing chest pain which is sharp and is a 6 on the pain scale with SOB PT has arthritis in her hands for 4-5 yrs. Pt wants a physical and a mammogram.

## 2013-05-01 NOTE — Progress Notes (Signed)
Patient ID: Eileen [name], female   DOB: May 17, 1971, 42 y.o.   MRN: [id number]  PCP:  Provider Not In System   Chief Complaint:  Persistent nausea and dyspepsia  HPI: 42 year old female with no significant past medical history except for right inguinal hernia repair is here for ongoing nausea occasional vomiting, dyspepsia and epigastric pain. On his evening the chart symptoms have been ongoing for possible 5-6 years. She has had workup including HIDA scan done in 2008 which was unremarkable. Also had an upper GI series for positive H pylori which was unremarkable . She denies any fever or chills but does report increasing fatigue and some dyspnea on exertion. Denied change in her weight and does have good appetite but gets nauseous frequently and has off-and-on vomiting. Denies headache, blurred vision, dizziness, chest pain, palpitations, orthopnea or PND. Denies bowel or urinary symptoms. He had joint pains. Denies menstrual irregularities. Getting her recent evaluations she was given a course of prednisone, pain medications, albuterol inhaler with concerns for bronchitis.   Allergies: No Known Allergies  Prior to Admission medications   Medication Sig Start Date End Date Taking? Authorizing Provider  albuterol (PROVENTIL HFA;VENTOLIN HFA) 108 (90 BASE) MCG/ACT inhaler Inhale 1-2 puffs into the lungs every 6 (six) hours as needed for wheezing. 04/19/13  Yes April K Palumbo-Rasch, MD  HYDROcodone-acetaminophen (NORCO/VICODIN) 5-325 MG per tablet Take 1 tablet by mouth every 4 (four) hours as needed for pain. 04/04/13   Arie Sabina Schinlever, PA-C  loratadine (CLARITIN) 10 MG tablet Take 1 tablet (10 mg total) by mouth daily. 04/19/13   April K Palumbo-Rasch, MD  omeprazole (PRILOSEC) 40 MG capsule Take 1 capsule (40 mg total) by mouth daily. 05/01/13   Nyaja Dubu[place], MD  predniSONE (DELTASONE) 20 MG tablet 2 tabs po daily x 4 days 04/19/13   April K Palumbo-Rasch, MD  promethazine  (PHENERGAN) 25 MG tablet Take 1 tablet (25 mg total) by mouth every 6 (six) hours as needed for nausea. 04/04/13   Otilio Miu, PA-C    Past Medical History  Diagnosis Date  . Right inguinal hernia   . Arthritis   . CHF (congestive heart failure)     "unsure thinks lungs had fluid around after surgery 7 years ago"  . Chronic kidney disease     kidney infection  . Shortness of breath     Past Surgical History  Procedure Laterality Date  . Cesarean section    . Tubal ligation    . Inguinal hernia repair  09/12/2011    Procedure: HERNIA REPAIR INGUINAL ADULT;  Surgeon: Atilano Ina, MD;  Location: [facility] OR;  Service: General;  Laterality: Right;  open right inguinal hernia with mesh   . Hernia repair  09/12/11    RIH    Social History:  reports that she has been smoking Cigarettes.  She has a 10 pack-year smoking history. She has never used smokeless tobacco. She reports that she does not drink alcohol or use illicit drugs.  Family History  Problem Relation Age of Onset  . Ulcers Father     stomach  . Diabetes Brother     Review of Systems:    Physical Exam:  Filed Vitals:   05/01/13 1609  BP: 102/72  Pulse: 69  Temp: 98.2 F (36.8 C)  TempSrc: Oral  Resp: 16  Height: 5' 1.42" (1.56 m)  Weight: 160 lb (72.576 kg)  SpO2: 99%   Middle aged female in no acute distress HEENT: No pallor,  moist oral mucosa Chest: Clear auscultation bilaterally, no added sounds CVS: Normal S1 and S2, no murmurs or gallop Abdomen: Soft, nondistended, bowel sounds present, tender to palpation over epigastric area, no Murphy's sign Ext: warm, no edema  CNS: AAOX3  Labs on Admission:  No results found for this or any previous visit (from the past 48 hour(s)).  Radiological Exams on Admission: Dg Chest 2 View  04/19/2013   CLINICAL DATA:  Chest pain and cough.  EXAM: CHEST  2 VIEW  COMPARISON:  04/04/2013.  FINDINGS: Normal sized heart. Clear lungs. Mild diffuse peribronchial  thickening with mild progression. Stable mild scoliosis.  IMPRESSION: Mild bronchitic changes with mild progression.   Electronically Signed   By: Gordan Payment   On: 04/19/2013 00:35    Assessment/Plan Persistent nausea and dyspepsia Concern for ongoing GERD vs PUD. Symptoms have been for past 6-7 years.  Patient needs outpatient GI evaluation. Her LFTs have been normal. HIDA scan done to rule out acute cholecystitis 2 years back was normal. I was continue on omeprazole 40 mg daily. I would make a referral  to outpatient GI. continue phenergan prn.  Shortness of breath Patient reports dyspnea on exertion. Lung exam unremarkable. Continue albuterol inhaler. Will check TSH. Counseled strongly on smoking cessation    Follow up  As needed  Eileen [name] 05/01/2013, 4:37 PM

## 2013-05-01 NOTE — Addendum Note (Signed)
Addended by: Lestine Mount on: 05/01/2013 05:06 PM   Modules accepted: Orders

## 2013-07-18 ENCOUNTER — Ambulatory Visit: Payer: No Typology Code available for payment source | Attending: Internal Medicine | Admitting: Internal Medicine

## 2013-07-18 ENCOUNTER — Encounter: Payer: Self-pay | Admitting: Internal Medicine

## 2013-07-18 VITALS — BP 111/78 | HR 82 | Temp 97.8°F | Resp 17 | Ht 63.0 in | Wt 162.0 lb

## 2013-07-18 DIAGNOSIS — R1013 Epigastric pain: Secondary | ICD-10-CM

## 2013-07-18 DIAGNOSIS — G8929 Other chronic pain: Secondary | ICD-10-CM

## 2013-07-18 LAB — CBC WITH DIFFERENTIAL/PLATELET
Eosinophils Absolute: 0.1 10*3/uL (ref 0.0–0.7)
Hemoglobin: 13.7 g/dL (ref 12.0–15.0)
Lymphs Abs: 2.6 10*3/uL (ref 0.7–4.0)
MCH: 31 pg (ref 26.0–34.0)
Monocytes Relative: 8 % (ref 3–12)
Neutrophils Relative %: 59 % (ref 43–77)
RBC: 4.42 MIL/uL (ref 3.87–5.11)

## 2013-07-18 LAB — COMPLETE METABOLIC PANEL WITH GFR
ALT: 11 U/L (ref 0–35)
AST: 10 U/L (ref 0–37)
CO2: 25 mEq/L (ref 19–32)
Creat: 0.7 mg/dL (ref 0.50–1.10)
GFR, Est African American: >89 mL/min
Total Bilirubin: 0.4 mg/dL (ref 0.3–1.2)

## 2013-07-18 LAB — URIC ACID: Uric Acid, Serum: 4 mg/dL (ref 2.4–7.0)

## 2013-07-18 MED ORDER — OMEPRAZOLE 40 MG PO CPDR: 40.0000 mg | DELAYED_RELEASE_CAPSULE | Freq: Every day | ORAL | Status: DC

## 2013-07-18 MED ORDER — MELOXICAM 15 MG PO TABS: 15.0000 mg | ORAL_TABLET | Freq: Every day | ORAL | Status: DC

## 2013-07-18 NOTE — Progress Notes (Signed)
Pt is here following up on her abdominal pain. There has been no progress with the pain and discomfort. Pt is also having pain in her hand.

## 2013-07-18 NOTE — Progress Notes (Signed)
Patient ID: Eileen [name], female   DOB: 04/05/1971, 42 y.o.   MRN: [id number]   CC:  HPI: 42 year old female with multiple complaints mainly abdominal pain and right wrist pain. The patient has not complained of right upper quadrant pain and right lower quadrant pain for the last 6 years. She has had an extensive workup including a hida scan in 2008 which was negative. 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 Right inguinal hernia   . Arthritis   . CHF (congestive heart failure)     "unsure thinks lungs had fluid around after surgery 7 years ago"  . Chronic kidney disease     kidney infection  . Shortness of breath    Current Outpatient Prescriptions on File Prior to Visit  Medication Sig Dispense Refill  . albuterol (PROVENTIL HFA;VENTOLIN HFA) 108 (90 BASE) MCG/ACT inhaler Inhale 1-2 puffs into the lungs every 6 (six) hours as needed for wheezing.  1 Inhaler  0  . HYDROcodone-acetaminophen (NORCO/VICODIN) 5-325 MG per tablet Take 1 tablet by mouth every 4 (four) hours as needed for pain.  12 tablet  0  . loratadine (CLARITIN) 10 MG tablet Take 1 tablet (10 mg total) by mouth daily.  5 tablet  0  . predniSONE (DELTASONE) 20 MG tablet 2 tabs po daily x 4 days  8 tablet  0  . promethazine (PHENERGAN) 25 MG tablet Take 1 tablet (25 mg total) by mouth every 6 (six) hours as needed for nausea.  20 tablet  0   No current facility-administered medications on file prior to visit.   Family History  Problem Relation Age of Onset  . Ulcers Father     stomach  . Diabetes Brother    History   Social History  . Marital Status: Legally Separated    Spouse Name: N/A    Number of  Children: N/A  . Years of Education: N/A   Occupational History  . Not on file.   Social History Main Topics  . Smoking status: Current Every Day Smoker -- 0.50 packs/day for 20 years    Types: Cigarettes  . Smokeless tobacco: Never Used  . Alcohol Use: No  . Drug Use: No  . Sexual Activity: Yes    Birth Control/ Protection: Inserts   Other Topics Concern  . Not on file   Social History Narrative  . No narrative on file    Review of Systems  Constitutional: Negative for fever, chills, diaphoresis, activity change, appetite change and fatigue.  HENT: Negative for ear pain, nosebleeds, congestion, facial swelling, rhinorrhea, neck pain, neck stiffness and ear discharge.   Eyes: Negative for pain, discharge, redness, itching and visual disturbance.  Respiratory: Negative for cough, choking, chest tightness, shortness of breath, wheezing and stridor.   Cardiovascular: Negative for chest pain, palpitations and leg swelling.  Gastrointestinal: Negative for abdominal distention.  Genitourinary: Negative for dysuria, urgency, frequency, hematuria, flank pain, decreased urine volume, difficulty urinating and dyspareunia.  Musculoskeletal: Negative for back pain, joint swelling, arthralgias and gait problem.  Neurological: Negative for dizziness, tremors, seizures, syncope, facial asymmetry, speech difficulty, weakness, light-headedness, numbness and headaches.  Hematological: Negative for adenopathy. Does not bruise/bleed easily.  Psychiatric/Behavioral: Negative for hallucinations, behavioral problems, confusion, dysphoric mood, decreased concentration and agitation.    Objective:   Filed Vitals:   07/18/13 1719  BP: 111/78  Pulse: 82  Temp: 97.8 F (36.6 C)  Resp: 17    Physical Exam  Constitutional: Appears well-developed and well-nourished. No distress.  HENT: Normocephalic. External right and left ear normal. Oropharynx is clear and moist.  Eyes: Conjunctivae and EOM are  normal. PERRLA, no scleral icterus.  Neck: Normal ROM. Neck supple. No JVD. No tracheal deviation. No thyromegaly.  CVS: RRR, S1/S2 +, no murmurs, no gallops, no carotid bruit.  Pulmonary: Effort and breath sounds normal, no stridor, rhonchi, wheezes, rales.  Abdominal: Soft. BS +,  no distension, tenderness, rebound or guarding.  Musculoskeletal: Normal range of motion. No edema and no tenderness.  Lymphadenopathy: No lymphadenopathy noted, cervical, inguinal. Neuro: Alert. Normal reflexes, muscle tone coordination. No cranial nerve deficit. Skin: Skin is warm and dry. No rash noted. Not diaphoretic. No erythema. No pallor.  Psychiatric: Normal mood and affect. Behavior, judgment, thought content normal.   Lab Results  Component Value Date   WBC 7.2 04/18/2013   HGB 13.2 04/18/2013   HCT 37.3 04/18/2013   MCV 88.4 04/18/2013   PLT 288 04/18/2013   Lab Results  Component Value Date   CREATININE 0.70 04/18/2013   BUN 14 04/18/2013   NA 138 04/18/2013   K 3.5 04/18/2013   CL 104 04/18/2013   CO2 25 04/18/2013    No results found for this basename: HGBA1C   Lipid Panel     Component Value Date/Time   CHOL 159 06/28/2010 2048   TRIG 132 06/28/2010 2048   HDL 40 06/28/2010 2048   CHOLHDL 4.0 Ratio 06/28/2010 2048   VLDL 26 06/28/2010 2048   LDLCALC 93 06/28/2010 2048       Assessment and plan:   Patient Active Problem List   Diagnosis Date Noted  . Nausea with vomiting 05/01/2013  . Dyspepsia 05/01/2013       Chronic abdominal pain We'll obtain a CT scan of the abdomen and pelvis She status post right inguinal hernia repair which is stable Gastroenterology referral has been provided We'll draw CMP, uric acid, UA, PPI  Right wrist sprain Probably tenosynovitis Provided a splint   Gynecology referral, dentist referral, gastroenterology and ophthalmology referral has been provided   The patient was given clear instructions to go to ER or return to medical center if symptoms  don't improve, worsen or new problems develop. The patient verbalized understanding. The patient was told to call to get any lab results if not heard anything in the next week.

## 2013-07-21 LAB — H. PYLORI ANTIBODY, IGG: H Pylori IgG: 0.99 {ISR} — ABNORMAL HIGH

## 2013-07-29 ENCOUNTER — Ambulatory Visit (HOSPITAL_COMMUNITY)
Admission: RE | Admit: 2013-07-29 | Discharge: 2013-07-29 | Disposition: A | Discharge status: Home / Self Care | Payer: No Typology Code available for payment source | Source: Ambulatory Visit | Attending: Internal Medicine | Admitting: Internal Medicine

## 2013-07-29 DIAGNOSIS — G8929 Other chronic pain: Secondary | ICD-10-CM | POA: Insufficient documentation

## 2013-07-29 DIAGNOSIS — Q619 Cystic kidney disease, unspecified: Secondary | ICD-10-CM | POA: Insufficient documentation

## 2013-07-29 DIAGNOSIS — R109 Unspecified abdominal pain: Secondary | ICD-10-CM | POA: Insufficient documentation

## 2013-07-29 MED ORDER — IOHEXOL 300 MG/ML  SOLN
80.0000 mL | Freq: Once | INTRAMUSCULAR | Status: AC | PRN
  Administered 2013-07-29: 80 mL via INTRAVENOUS

## 2013-08-05 ENCOUNTER — Encounter: Payer: Self-pay | Admitting: Internal Medicine

## 2013-09-04 ENCOUNTER — Encounter: Payer: Self-pay | Admitting: Internal Medicine

## 2013-09-08 ENCOUNTER — Telehealth: Payer: Self-pay | Admitting: Internal Medicine

## 2013-09-08 ENCOUNTER — Encounter: Payer: Self-pay | Admitting: Internal Medicine

## 2013-09-08 ENCOUNTER — Ambulatory Visit (INDEPENDENT_AMBULATORY_CARE_PROVIDER_SITE_OTHER): Payer: No Typology Code available for payment source | Admitting: Internal Medicine

## 2013-09-08 VITALS — BP 100/74 | HR 72 | Ht 61.5 in | Wt 160.8 lb

## 2013-09-08 DIAGNOSIS — R110 Nausea: Secondary | ICD-10-CM

## 2013-09-08 DIAGNOSIS — R109 Unspecified abdominal pain: Secondary | ICD-10-CM

## 2013-09-08 MED ORDER — MOVIPREP 100 G PO SOLR: ORAL | Status: DC

## 2013-09-08 NOTE — Patient Instructions (Addendum)

## 2013-09-08 NOTE — Progress Notes (Addendum)
Patient ID: Eileen [name], female   DOB: Mar 30, 1971, 43 y.o.   MRN: [id number] HPI: Eileen [name] is a 43 yo female with PMH of mild asthma and history of right inguinal hernia status post repair who is seen in consultation at the request of Dr. Allyson Sabal for evaluation of chronic right sided abdominal pain. She is here today with a medical Spanish interpreter. She reports she's had 8 years of daily right-sided abdominal pain. She feels that this has recently been a little worse, but has a hard time defining recently. She reports the pain is sharp and constant in nature and it can spread out over the right lower abdomen. She notices more when she is in her car driving. He does not seem to relate to eating or bowel movement. She is sleeping well but the pain is always there when she wakes up. She reports a good appetite without weight loss. Nausea has been a problem for her but not vomiting. She also reports feeling like air is trapped in her upper abdomen which would be relieved by belching if she were able to. She denies heartburn, dysphagia or odynophagia. She reports normal bowel movements occurring one to 2 times a day without blood in her stool or melena. She denies diarrhea or constipation. She does report 2 previous cesarean sections and feels like this pain worsened after her first C-section in 2006.  She did have a recent CT scan and labs by her primary care office  She also has a GYN appointment upcoming on 09/26/2013, and reports no GYN care for the last 3 or 4 years. She denies abnormal vaginal bleeding or discharge  Past Medical History  Diagnosis Date  . Right inguinal hernia   . Arthritis   . CHF (congestive heart failure)     "unsure thinks lungs had fluid around after surgery 7 years ago"  . Chronic kidney disease     kidney infection  . Shortness of breath   . Asthma     Past Surgical History  Procedure Laterality Date  . Cesarean section      x2  . Tubal ligation     . Inguinal hernia repair  09/12/2011    Procedure: HERNIA REPAIR INGUINAL ADULT;  Surgeon: Gayland Curry, MD;  Location: [facility];  Service: General;  Laterality: Right;  open right inguinal hernia with mesh   . Hernia repair  09/12/11    RIH    Current Outpatient Prescriptions  Medication Sig Dispense Refill  . albuterol (PROVENTIL HFA;VENTOLIN HFA) 108 (90 BASE) MCG/ACT inhaler Inhale 1-2 puffs into the lungs every 6 (six) hours as needed for wheezing.  1 Inhaler  0  . meloxicam (MOBIC) 15 MG tablet Take 1 tablet (15 mg total) by mouth daily.  90 tablet  0  . MOVIPREP 100 G SOLR Use per prep instruction  1 kit  0   No current facility-administered medications for this visit.    No Known Allergies  Family History  Problem Relation Age of Onset  . Ulcers Father     stomach  . Diabetes Brother     History  Substance Use Topics  . Smoking status: Current Every Day Smoker -- 0.50 packs/day for 20 years    Types: Cigarettes  . Smokeless tobacco: Never Used  . Alcohol Use: No    ROS: As per history of present illness, otherwise negative  BP 100/74  Pulse 72  Ht 5' 1.5" (1.562 m)  Wt 160 lb 12.8 oz (  72.938 kg)  BMI 29.89 kg/m2  LMP 09/05/2013 Constitutional: Well-developed and well-nourished. No distress. HEENT: Normocephalic and atraumatic. Oropharynx is clear and moist. No oropharyngeal exudate. Conjunctivae are normal.  No scleral icterus. Neck: Neck supple. Trachea midline. Cardiovascular: Normal rate, regular rhythm and intact distal pulses. No M/R/G Pulmonary/chest: Effort normal and breath sounds normal. No wheezing, rales or rhonchi. Abdominal: Soft, mild lower abdominal tenderness right greater than left without palpable mass, nondistended. Bowel sounds active throughout.  Extremities: no clubbing, cyanosis, or edema Lymphadenopathy: No cervical adenopathy noted. Neurological: Alert and oriented to person place and time. Skin: Skin is warm and dry. No rashes  noted. Psychiatric: Normal mood and affect. Behavior is normal.  RELEVANT LABS AND IMAGING: CBC    Component Value Date/Time   WBC 8.1 07/18/2013 1757   RBC 4.42 07/18/2013 1757   HGB 13.7 07/18/2013 1757   HCT 39.8 07/18/2013 1757   PLT 295 07/18/2013 1757   MCV 90.0 07/18/2013 1757   MCH 31.0 07/18/2013 1757   MCHC 34.4 07/18/2013 1757   RDW 14.1 07/18/2013 1757   LYMPHSABS 2.6 07/18/2013 1757   MONOABS 0.7 07/18/2013 1757   EOSABS 0.1 07/18/2013 1757   BASOSABS 0.0 07/18/2013 1757    CMP     Component Value Date/Time   NA 138 07/18/2013 1757   K 3.9 07/18/2013 1757   CL 105 07/18/2013 1757   CO2 25 07/18/2013 1757   GLUCOSE 76 07/18/2013 1757   BUN 18 07/18/2013 1757   CREATININE 0.70 07/18/2013 1757   CREATININE 0.70 04/18/2013 2352   CALCIUM 9.1 07/18/2013 1757   PROT 6.8 07/18/2013 1757   ALBUMIN 4.3 07/18/2013 1757   AST 10 07/18/2013 1757   ALT 11 07/18/2013 1757   ALKPHOS 64 07/18/2013 1757   BILITOT 0.4 07/18/2013 1757   GFRNONAA >90 04/18/2013 2352   GFRAA >90 04/18/2013 2352   HIDA 2008 - normal  CT ABDOMEN AND PELVIS WITH CONTRAST -- 07/29/2013   TECHNIQUE: Multidetector CT imaging of the abdomen and pelvis was performed using the standard protocol following bolus administration of intravenous contrast.   CONTRAST:  11mL OMNIPAQUE IOHEXOL 300 MG/ML  SOLN   COMPARISON:  Abdominal ultrasound dated 03/11/2007   FINDINGS: Lung bases are clear.   Liver is notable for focal fat/altered perfusion along the falciform ligament (series 2/ image 19).   Spleen, pancreas, and adrenal glands are within normal limits.   Gallbladder is unremarkable. No intrahepatic or extrahepatic ductal dilatation.   Kidneys are notable for a 10 mm left lower pole renal cyst (series 7/ image 21). Right kidney is within normal limits. No hydronephrosis.   No evidence of bowel obstruction.  Normal appendix.   No evidence of abdominal aortic aneurysm. Retroaortic left  renal vein.   No abdominopelvic ascites.   No suspicious abdominopelvic lymphadenopathy.   Uterus and bilateral ovaries are grossly unremarkable.   Bladder is within normal limits.   Visualized osseous structures are within normal limits.   IMPRESSION: No evidence of bowel obstruction.  Normal appendix.   No CT findings to account for the patient's chronic right abdominal pain.     ASSESSMENT/PLAN: 43 yo female with PMH of mild asthma and history of right inguinal hernia status post repair who is seen in consultation at the request of Dr. Allyson Sabal for evaluation of chronic right sided abdominal pain.   1.  Right middle/lower abd pain, chronic -- unclear etiology for the patient's pain, but it is certainly chronic in nature and  has been present for 8 years.  The CT scan done in December 2014 was reviewed and is unrevealing from an abdominal pain standpoint. She denies constipation, but it will be interesting to see if her pain is improved in her colon is purged for colonoscopy.  I recommended upper and lower endoscopy to evaluate her pain as described above. We discussed the test today including the risks and benefits and she is agreeable to proceed. I agree with GYN consultation to rule out a pelvic source for the pain.  Further recommendations after procedure.

## 2013-09-17 ENCOUNTER — Ambulatory Visit: Payer: No Typology Code available for payment source | Admitting: Internal Medicine

## 2013-09-22 ENCOUNTER — Telehealth: Payer: Self-pay | Admitting: Internal Medicine

## 2013-09-22 NOTE — Telephone Encounter (Signed)
Spoke with a friend of her mothers daughter, who states that her prescription was not available at pharmacy when she went to pick it up. Order in chart, pharmacy called who said they located it and prescription is ready for patient to pickup. Informed interpreter that it was available and she needs to pick it up now so it can be mixed and refrigerated to begin drinking at 5 pm this evening.

## 2013-09-23 ENCOUNTER — Ambulatory Visit (AMBULATORY_SURGERY_CENTER): Payer: Self-pay | Admitting: Internal Medicine

## 2013-09-23 ENCOUNTER — Encounter: Payer: Self-pay | Admitting: Internal Medicine

## 2013-09-23 VITALS — BP 120/58 | HR 61 | Temp 97.7°F | Resp 23 | Ht 61.5 in | Wt 160.0 lb

## 2013-09-23 DIAGNOSIS — D126 Benign neoplasm of colon, unspecified: Secondary | ICD-10-CM

## 2013-09-23 DIAGNOSIS — R110 Nausea: Secondary | ICD-10-CM

## 2013-09-23 DIAGNOSIS — R109 Unspecified abdominal pain: Secondary | ICD-10-CM

## 2013-09-23 MED ORDER — SODIUM CHLORIDE 0.9 % IV SOLN: 500.0000 mL | INTRAVENOUS | Status: DC

## 2013-09-23 NOTE — Progress Notes (Addendum)
NO ALLERGY TO EGGS OR SOY. EMW NO PROBLEMS WITH PAST SEDATION. EWM PT'S SON IS 42 YO. AND HE IS DRIVING HOME. EWM PT STATES SHE HAS PAIN IN HER RIGHT ARM, FROM POSSIBLE ARTHRITIS AND PREFERS WE DO HER IV IN THE LEFT. EWM DR PYRTLE AND M.SMITH CRNA BOTH NOTIFIED OF DRINKING AT 1410 BLACK COFFEE , FEW SIPS AND A CUP OF WATER AND ALSO OF PT EATING BEANS AND TORTILLAS YEST AT 1130 AM WITH NO FURTHER ORDERS. PT STATES SHE HAS A CLEAR YELLOW STOOL DESPITE EATING YESTERDAY. EWM

## 2013-09-23 NOTE — Progress Notes (Signed)
Called to room to assist during endoscopic procedure.  Patient ID and intended procedure confirmed with present staff. Received instructions for my participation in the procedure from the performing physician.  

## 2013-09-23 NOTE — Progress Notes (Signed)
Linzess given to patient by Dr. Hilarie Fredrickson.  They are samples.  Patient will call if the medication works for her.

## 2013-09-23 NOTE — Progress Notes (Signed)
Lidocaine-40mg IV prior to Propofol InductionPropofol given over incremental dosages 

## 2013-09-23 NOTE — Op Note (Signed)
[place]  [address] [place], [id number]   ENDOSCOPY PROCEDURE REPORT  PATIENT: Eileen, [name]  MR#: [id number] BIRTHDATE: 1971/01/27 , 42  yrs. old GENDER: Female ENDOSCOPIST: Jerene Bears, MD PROCEDURE DATE:  09/23/2013 PROCEDURE:  EGD, diagnostic ASA CLASS:     Class II INDICATIONS:  chronic abdominal pain in the right middle and lower abdomen. MEDICATIONS: MAC sedation, administered by CRNA and propofol (Diprivan) 200mg  IV TOPICAL ANESTHETIC: Cetacaine Spray  DESCRIPTION OF PROCEDURE: After the risks benefits and alternatives of the procedure were thoroughly explained, informed consent was obtained.  The [id number] endoscope was introduced through the mouth and advanced to the second portion of the duodenum. Without limitations.  The instrument was slowly withdrawn as the mucosa was fully examined.      The upper, middle and distal third of the esophagus were carefully inspected and no abnormalities were noted.  The z-line was well seen at the GEJ.  The endoscope was pushed into the fundus which was normal including a retroflexed view.  The antrum, gastric body, first and second part of the duodenum were unremarkable. Retroflexed views revealed no abnormalities.     The scope was then withdrawn from the patient and the procedure completed.  COMPLICATIONS: There were no complications.  ENDOSCOPIC IMPRESSION: Normal EGD  RECOMMENDATIONS: Colonoscopy today  eSigned:  Jerene Bears, MD 09/23/2013 4:09 PM   CC:The Patient; Reyne Dumas, MD

## 2013-09-23 NOTE — Patient Instructions (Signed)
YOU HAD AN ENDOSCOPIC PROCEDURE TODAY AT [facility]: Refer to the procedure report that was given to you for any specific questions about what was found during the examination.  If the procedure report does not answer your questions, please call your gastroenterologist to clarify.  If you requested that your care partner not be given the details of your procedure findings, then the procedure report has been included in a sealed envelope for you to review at your convenience later.  YOU SHOULD EXPECT: Some feelings of bloating in the abdomen. Passage of more gas than usual.  Walking can help get rid of the air that was put into your GI tract during the procedure and reduce the bloating. If you had a lower endoscopy (such as a colonoscopy or flexible sigmoidoscopy) you may notice spotting of blood in your stool or on the toilet paper. If you underwent a bowel prep for your procedure, then you may not have a normal bowel movement for a few days.  DIET: Your first meal following the procedure should be a light meal and then it is ok to progress to your normal diet.  A half-sandwich or bowl of soup is an example of a good first meal.  Heavy or fried foods are harder to digest and may make you feel nauseous or bloated.  Likewise meals heavy in dairy and vegetables can cause extra gas to form and this can also increase the bloating.  Drink plenty of fluids but you should avoid alcoholic beverages for 24 hours.  High fiber diet.  ACTIVITY: Your care partner should take you home directly after the procedure.  You should plan to take it easy, moving slowly for the rest of the day.  You can resume normal activity the day after the procedure however you should NOT DRIVE or use heavy machinery for 24 hours (because of the sedation medicines used during the test).    SYMPTOMS TO REPORT IMMEDIATELY: A gastroenterologist can be reached at any hour.  During normal business hours, 8:30 AM to 5:00 PM Monday  through Friday, call [phone].  After hours and on weekends, please call the GI answering service at [phone] who will take a message and have the physician on call contact you.   Following lower endoscopy (colonoscopy or flexible sigmoidoscopy):  Excessive amounts of blood in the stool  Significant tenderness or worsening of abdominal pains  Swelling of the abdomen that is new, acute  Fever of 100F or higher  Following upper endoscopy (EGD)  Vomiting of blood or coffee ground material  New chest pain or pain under the shoulder blades  Painful or persistently difficult swallowing  New shortness of breath  Fever of 100F or higher  Black, tarry-looking stools  FOLLOW UP: If any biopsies were taken you will be contacted by phone or by letter within the next 1-3 weeks.  Call your gastroenterologist if you have not heard about the biopsies in 3 weeks.  Our staff will call the home number listed on your records the next business day following your procedure to check on you and address any questions or concerns that you may have at that time regarding the information given to you following your procedure. This is a courtesy call and so if there is no answer at the home number and we have not heard from you through the emergency physician on call, we will assume that you have returned to your regular daily activities without incident.  SIGNATURES/CONFIDENTIALITY:  You and/or your care partner have signed paperwork which will be entered into your electronic medical record.  These signatures attest to the fact that that the information above on your After Visit Summary has been reviewed and is understood.  Full responsibility of the confidentiality of this discharge information lies with you and/or your care-partner.

## 2013-09-23 NOTE — Op Note (Signed)
[address]  [address] [place], [address]   COLONOSCOPY PROCEDURE REPORT  PATIENT: [name], [name]  MR#: [id number] BIRTHDATE: 06/30/1971 , 42  yrs. old GENDER: Female ENDOSCOPIST: Jerene Bears, MD PROCEDURE DATE:  09/23/2013 PROCEDURE:   Colonoscopy with snare polypectomy First Screening Colonoscopy - Avg.  risk and is 50 yrs.  old or older - No.  Prior Negative Screening - Now for repeat screening. N/A  History of Adenoma - Now for follow-up colonoscopy & has been > or = to 3 yrs.  N/A  Polyps Removed Today? Yes. ASA CLASS:   Class II INDICATIONS:chronic right middle and lower abdominal pain MEDICATIONS: MAC sedation, administered by CRNA and propofol (Diprivan) 200mg  IV  DESCRIPTION OF PROCEDURE:   After the risks benefits and alternatives of the procedure were thoroughly explained, informed consent was obtained.  A digital rectal exam revealed no rectal mass.   The LB PFC-H190 [id number]  endoscope was introduced through the anus and advanced to the terminal ileum which was intubated for a short distance. No adverse events experienced.   The quality of the prep was good, using MoviPrep  The instrument was then slowly withdrawn as the colon was fully examined.   COLON FINDINGS: The mucosa appeared normal in the terminal ileum. A sessile polyp measuring 3-4 mm in size was found in the ascending colon.  A polypectomy was performed with a cold snare.  The resection was complete and the polyp tissue was completely retrieved.   Mild diverticulosis was noted at the cecum, in the ascending colon, transverse colon, descending colon, and sigmoid colon.   The colon mucosa was otherwise normal.  Retroflexed views revealed no abnormalities. The time to cecum=1 minutes 54 seconds. Withdrawal time=10 minutes 27 seconds.  The scope was withdrawn and the procedure completed.  COMPLICATIONS: There were no complications.      ENDOSCOPIC IMPRESSION: 1.   Normal  mucosa in the terminal ileum 2.   Sessile polyp measuring 3-4 mm in size was found in the ascending colon; polypectomy was performed with a cold snare 3.   Mild diverticulosis was noted at the cecum, in the ascending colon, transverse colon, descending colon, and sigmoid colon 4.   The colon mucosa was otherwise normal with no evidence of inflammation or IBD  RECOMMENDATIONS: 1.  Await pathology results 2.  High fiber diet 3.  Trial of Linzess 145 mcg daily (given that abdominal pain has improved to some degree after colon preparation) 4.  Office follow-up in about 6 weeks   eSigned:  Jerene Bears, MD 09/23/2013 4:15 PM   cc: The Patient; Reyne Dumas, MD   PATIENT NAME:  Eileen [name], Eileen [name] MR#: [id number]

## 2013-09-24 ENCOUNTER — Telehealth: Payer: Self-pay

## 2013-09-24 NOTE — Telephone Encounter (Signed)
  Follow up Call-  No flowsheet data found.   Patient questions:  Do you have a fever, pain , or abdominal swelling? no Pain Score  0 *  Have you tolerated food without any problems? yes  Have you been able to return to your normal activities? yes  Do you have any questions about your discharge instructions: Diet   no Medications  no Follow up visit  no  Do you have questions or concerns about your Care? no  Actions: * If pain score is 4 or above: No action needed, pain <4.   Follow up Call-  No flowsheet data found.   Patient questions:  Do you have a fever, pain , or abdominal swelling? no Pain Score  0 *  Have you tolerated food without any problems? yes  Have you been able to return to your normal activities? yes  Do you have any questions about your discharge instructions: Diet   no Medications  no Follow up visit  no  Do you have questions or concerns about your Care? no  Actions: * If pain score is 4 or above: No action needed, pain <4.

## 2013-09-26 ENCOUNTER — Encounter: Payer: Self-pay | Admitting: Nurse Practitioner

## 2013-09-26 ENCOUNTER — Ambulatory Visit (INDEPENDENT_AMBULATORY_CARE_PROVIDER_SITE_OTHER): Payer: No Typology Code available for payment source | Admitting: Nurse Practitioner

## 2013-09-26 VITALS — BP 105/71 | HR 65 | Temp 97.3°F | Ht 62.0 in | Wt 159.0 lb

## 2013-09-26 DIAGNOSIS — Z0000 Encounter for general adult medical examination without abnormal findings: Secondary | ICD-10-CM

## 2013-09-26 DIAGNOSIS — F17200 Nicotine dependence, unspecified, uncomplicated: Secondary | ICD-10-CM

## 2013-09-26 NOTE — Progress Notes (Signed)
History:  Eileen [name] is a 43 y.o. No obstetric history on file. who presents to [facility] clinic today for her pap smear and to schedule mammogram. She had her last pap about 4 years ago and has never had an abnormal pap. She has never had a mammogram. She still has her menses. She had BTL for contraception. She has been worked up for chronic abdominal pain including a negative CT of abdomen and pelvis.   The following portions of the patient's history were reviewed and updated as appropriate: allergies, current medications, past family history, past medical history, past social history, past surgical history and problem list.  Review of Systems:  Pertinent items are noted in HPI.  Objective:  Physical Exam BP 105/71  Pulse 65  Temp(Src) 97.3 F (36.3 C) (Oral)  Ht 5\' 2"  (1.575 m)  Wt 159 lb (72.122 kg)  BMI 29.07 kg/m2  LMP 09/01/2013 GENERAL: Well-developed, well-nourished female in no acute distress.  HEENT: Normocephalic, atraumatic.  NECK: Supple. Normal thyroid.  LUNGS: Normal rate. Clear to auscultation bilaterally.  HEART: Regular rate and rhythm with no adventitious sounds.  BREASTS: Symmetric in size. No masses, skin changes, nipple drainage, or lymphadenopathy. ABDOMEN: Soft, nontender, nondistended. No organomegaly. Normal bowel sounds appreciated in all quadrants.  PELVIC: Normal external female genitalia. Vagina is pink and rugated.  Normal discharge. Normal cervix contour. Pap smear obtained. Uterus is normal in size. No adnexal mass or tenderness.  EXTREMITIES: No cyanosis, clubbing, or edema, 2+ distal pulses.   Labs and Imaging No results found.  Assessment & Plan:  Assessment:  Annual Exam Smoker  Plans:  Results of pap will be communicated with to patient Mammogram scheduled Advised to quit smoking/ information on cessation given Return one year or prn  Olegario Messier, NP 09/26/2013 9:56 AM

## 2013-09-26 NOTE — Patient Instructions (Signed)
Dejar de fumar  (Smoking Cessation) Dejar de fumar es importante para su salud y tiene Product/process development scientist. Sin embargo, no siempre es Control and instrumentation engineer de fumar ya que la nicotina es una droga [place]. Generalmente las personas intentan 3 veces o ms antes de poder dejar de fumar. En este documento se explican las mejores formas de prepararse para dejar de fumar. Esta decisin requiere [employer] y mucho esfuerzo, pero usted puede Eileen.  [name]   Vivir ms, se sentir mejor y vivir mejor.  El cuerpo sentir el impacto de dejar de fumar de inmediato.  Luego de 20 minutos la presin arterial disminuye. El pulso vuelve a su nivel normal.  Despus de 8 horas, los niveles de monxido de carbono en la sangre vuelven a la normalidad. Aumenta el nivel de oxgeno.  Despus de 24 horas, la probabilidad de infarto comienza a disminuir. La respiracin, el cabello y el cuerpo ya no huelen a humo.  Luego de 48 horas, los nervios daados comienzan a recuperarse. Mejoran el sentido del gusto y Armed forces logistics/support/administrative officer.  Luego de 72 horas, el organismo est virtualmente libre de nicotina. Los conductos bronquiales se relajan, la respiracin se normaliza.  Despus de 2 a 12 semanas, los pulmones pueden contener ms aire. Se facilita la actividad fsica y mejora la respiracin.  El riesgo de sufrir un infarto, un ictus, cncer o enfermedad pulmonar disminuye en gran medida.  Despus de 1 ao, el riesgo de coronariopatas disminuye a la mitad.  Despus de 5 aos, el riesgo de ictus disminuye al nivel de un no fumador.  Despus de 10 aos, el riesgo de cncer de pulmn disminuye a la mitad, y el riesgo de sufrir otros tipos de cncer disminuye considerablemente.  Despus de 15 aos, el riesgo de enfermedad coronaria disminuye, generalmente al nivel de un no fumador.  Si est embarazada, al dejar de fumar aumentar las probabilidades de tener un beb sano.  Las personas con las que convive,  [employer] nios, estarn ms saludables.  Tendr dinero extra para gastar en otras cosas que no sean cigarrillos. PREGUNTAS PARA PENSAR ANTES DE Eileen [name] DEJAR DE FUMAR  Quizs desee hablar acerca de sus preguntas con el mdico.   Por qu desea dejar de fumar?  Cuando trat de dejar de fumar en el pasado, qu lo ayud y qu no lo ayud?  Cules sern las situaciones ms difciles para usted despus de dejar de fumar? Cmo planea manejarlas?  Quin puede ayudarlo en los momentos difciles? Su familia? Sus amigos? Un profesional?  Qu placeres obtiene cuando fuma? De qu manera puede seguir obteniendo placer si abandona el hbito? Estas son algunas preguntas para hacrselas al profesional.   Cmo puede ayudarme a dejar de fumar con xito?  Qu medicamento cree que sera el mejor para m, y cmo debo tomarlo?  Qu debo hacer si necesito ms ayuda?  Cmo es la desintoxicacin del cigarrillo? Cmo puedo obtener informacin acerca de la desintoxicacin? Preprese   Establezca una fecha para dejar de fumar.  Cambie su entorno, deshacindose de los cigarrillos, ceniceros, fsforos y encendedores en su casa, el auto o el [place]. No permita que nadie fume dentro de su casa.  Repase sus intentos anteriores. Piense en qu cosas funcionaron y cules no. BUSQUE AYUDA Y ESTMULO  Usted tiene mejores probabilidades de tener xito si cuenta con ayuda. Puede obtener apoyo de [employer]:   Dgale a sus familiares, amigos y compaeros de trabajo que usted dejar  de fumar y que necesita su apoyo. Pdales que no fumen a su alrededor.  Obtenga consejo y 62 individual, grupal o telefnico. Hay programas que se ofrecen en hospitales y centros mdicos locales. Comunquese con el departamento de salud de su localidad para obtener informacin acerca de los programas disponibles en su rea.  Las creencias y prcticas espirituales pueden ayudar a los fumadores a abandonar el  hbito.  Descargue en su computadora un programa que registre sus estadsticas, por ejemplo, cunto hace que no fuma, la cantidad de cigarrillos que no ha fumado y el dinero ahorrado.  Consiga un libro de [place] sobre dejar de fumar y Dispensing optician del tabaco. Aprenda nuevas destrezas y conductas   Trate de entretenerse con otra cosa cuando sienta ganas de fumar. Hable con alguien, salga a caminar u ocpese en alguna tarea.  Cambie su rutina habitual. Salley Hews ruta diferente para llegar al [place]. Beba t en vez de caf. Desayune en un lugar diferente.  Reduzca las situaciones de estrs. Tome un bao caliente, practique alguna actividad fsica o lea un libro.  Planee hacer cada da algo que disfrute. Recompnsese por no fumar.  Explore programas interactivos en la web dedicados a ayudar a dejar de fumar. CONSIGA MEDICAMENTOS Y SELOS CORRECTAMENTE  Algunos medicamentos pueden ayudar a dejar de fumar y Equities trader la necesidad de tabaco. Oceanographer los medicamentos con las conductas y mtodos de apoyo ya mencionados puede aumentar en gran medida sus posibilidades de dejar de fumar con xito.   La terapia de reemplazo de nicotina enva nicotina al organismo sin los efectos negativos y los riesgos del fumar. La terapia de reemplazo de nicotina incluye chicles, pastillas, inhaladores nasales en aerosol y parches para la piel de nicotina. Algunos son de Rogelia Rohrer y otros requieren una receta mdica.  Los antidepresivos ayudan a las personas a Personal assistant de Copy, Armed forces training and education officer no se conoce cul es el mecanismo. Se venden bajo receta mdica.  Los [employer] parciales de los receptores de nicotina simulan el efecto de la nicotina en el cerebro. Se venden bajo receta mdica. Pdale a su mdico que lo aconseje [employer] que debe Risk manager y cmo utilizarlos en base a su historia clnica. El mdico le dir qu efectos secundarios deber Best boy en cuenta si decide utilizar un medicamento o seguir un  tratamiento. Lea cuidadosamente la informacin en el envase. No utilice cualquier otro producto que contenga nicotina durante el uso de un producto de reemplazo de nicotina.  [place]oussard parte de las recadas se producen dentro de los 3 primeros meses de abandonar el hbito. No  se desanime si comienza a fumar de nuevo. Recuerde, la [employer] tratan varias veces de dejar de fumar antes de lograrlo. Podr sufrir sndrome de abstinencia porque su cuerpo est acostumbrado a la nicotina. Podr sentir el deseo compulsivo de fumar, irritabilidad, enojo, tos, cefaleas y dificultad para concentrarse. Estos sntomas son transitorios. Son ms intensos en un comienzo, pero desaparecern en 10 a 14 das.  Para reducir las probabilidades de fracaso, trate de:   Evitar el consumo alcohol. El beber disminuye sus posibilidades de xito.  Disminuya el consumo de cafena. Una vez que deje de fumar, la cantidad de cafena en su organismo aumenta y puede darle sntomas, como frecuencia cardaca rpida, sudoracin y ansiedad.  Evite a las [employer] fuman porque pueden hacer que usted desee [place].  No deje que el aumento de peso distraiga su objetivo. Muchos fumadores aumentarn de  peso cuando dejen de fumar, generalmente menos de 4,5 Kg. Consuma una dieta saludable y [place]. Siempre podr perder [employer] que se gane despus de dejar de fumar.  Encuentre formas de mejorar su estado de nimo que no sean fumando PARA OBTENER MS INFORMACIN  www.smokefree.gov  Document Released: 08/14/2005 Document Revised: 02/13/2012 [facility] Patient Information 2014 [place], [place].

## 2013-09-29 ENCOUNTER — Encounter: Payer: Self-pay | Admitting: *Deleted

## 2013-09-29 ENCOUNTER — Encounter: Payer: Self-pay | Admitting: Internal Medicine

## 2013-10-08 ENCOUNTER — Encounter: Payer: Self-pay | Admitting: Internal Medicine

## 2013-10-15 ENCOUNTER — Ambulatory Visit: Payer: No Typology Code available for payment source

## 2013-10-29 ENCOUNTER — Telehealth: Payer: Self-pay | Admitting: Internal Medicine

## 2013-10-29 ENCOUNTER — Emergency Department (HOSPITAL_COMMUNITY)
Admission: EM | Admit: 2013-10-29 | Discharge: 2013-10-29 | Disposition: A | Discharge status: Home / Self Care | Payer: No Typology Code available for payment source | Attending: Emergency Medicine | Admitting: Emergency Medicine

## 2013-10-29 ENCOUNTER — Encounter (HOSPITAL_COMMUNITY): Payer: Self-pay | Admitting: Emergency Medicine

## 2013-10-29 ENCOUNTER — Emergency Department (HOSPITAL_COMMUNITY): Payer: No Typology Code available for payment source

## 2013-10-29 DIAGNOSIS — J40 Bronchitis, not specified as acute or chronic: Secondary | ICD-10-CM

## 2013-10-29 DIAGNOSIS — I509 Heart failure, unspecified: Secondary | ICD-10-CM | POA: Insufficient documentation

## 2013-10-29 DIAGNOSIS — B349 Viral infection, unspecified: Secondary | ICD-10-CM

## 2013-10-29 DIAGNOSIS — Z8669 Personal history of other diseases of the nervous system and sense organs: Secondary | ICD-10-CM | POA: Insufficient documentation

## 2013-10-29 DIAGNOSIS — J45901 Unspecified asthma with (acute) exacerbation: Secondary | ICD-10-CM | POA: Insufficient documentation

## 2013-10-29 DIAGNOSIS — B9789 Other viral agents as the cause of diseases classified elsewhere: Secondary | ICD-10-CM | POA: Insufficient documentation

## 2013-10-29 DIAGNOSIS — R42 Dizziness and giddiness: Secondary | ICD-10-CM | POA: Insufficient documentation

## 2013-10-29 DIAGNOSIS — F17200 Nicotine dependence, unspecified, uncomplicated: Secondary | ICD-10-CM | POA: Insufficient documentation

## 2013-10-29 DIAGNOSIS — M129 Arthropathy, unspecified: Secondary | ICD-10-CM | POA: Insufficient documentation

## 2013-10-29 DIAGNOSIS — Z8719 Personal history of other diseases of the digestive system: Secondary | ICD-10-CM | POA: Insufficient documentation

## 2013-10-29 DIAGNOSIS — R61 Generalized hyperhidrosis: Secondary | ICD-10-CM | POA: Insufficient documentation

## 2013-10-29 DIAGNOSIS — R6883 Chills (without fever): Secondary | ICD-10-CM | POA: Insufficient documentation

## 2013-10-29 DIAGNOSIS — N189 Chronic kidney disease, unspecified: Secondary | ICD-10-CM | POA: Insufficient documentation

## 2013-10-29 LAB — BASIC METABOLIC PANEL
BUN: 13 mg/dL (ref 6–23)
CALCIUM: 9.3 mg/dL (ref 8.4–10.5)
CO2: 23 mEq/L (ref 19–32)
Chloride: 105 mEq/L (ref 96–112)
Creatinine, Ser: 0.61 mg/dL (ref 0.50–1.10)
GFR calc Af Amer: >90 mL/min (ref >90)
Glucose, Bld: 101 mg/dL — ABNORMAL HIGH (ref 70–99)
Potassium: 4.2 mEq/L (ref 3.7–5.3)
Sodium: 141 mEq/L (ref 137–147)

## 2013-10-29 LAB — CBC
HEMATOCRIT: 37.8 % (ref 36.0–46.0)
Hemoglobin: 13.7 g/dL (ref 12.0–15.0)
MCH: 32.5 pg (ref 26.0–34.0)
MCHC: 36.2 g/dL — AB (ref 30.0–36.0)
MCV: 89.6 fL (ref 78.0–100.0)
Platelets: 276 10*3/uL (ref 150–400)
RBC: 4.22 MIL/uL (ref 3.87–5.11)
RDW: 14.1 % (ref 11.5–15.5)
WBC: 6.9 10*3/uL (ref 4.0–10.5)

## 2013-10-29 LAB — D-DIMER, QUANTITATIVE: D-Dimer, Quant: 0.33 ug/mL-FEU (ref 0.00–0.48)

## 2013-10-29 LAB — PRO B NATRIURETIC PEPTIDE: Pro B Natriuretic peptide (BNP): 40.6 pg/mL (ref 0–125)

## 2013-10-29 LAB — I-STAT TROPONIN, ED: Troponin i, poc: 0 ng/mL (ref 0.00–0.08)

## 2013-10-29 MED ORDER — DIPHENOXYLATE-ATROPINE 2.5-0.025 MG PO TABS: 1.0000 | ORAL_TABLET | Freq: Four times a day (QID) | ORAL | Status: DC | PRN

## 2013-10-29 MED ORDER — PREDNISONE 20 MG PO TABS: 60.0000 mg | ORAL_TABLET | Freq: Every day | ORAL | Status: DC

## 2013-10-29 MED ORDER — ALBUTEROL SULFATE HFA 108 (90 BASE) MCG/ACT IN AERS
2.0000 | INHALATION_SPRAY | RESPIRATORY_TRACT | Status: DC | PRN
  Administered 2013-10-29: 2 via RESPIRATORY_TRACT
  Filled 2013-10-29: qty 6.7

## 2013-10-29 MED ORDER — PROMETHAZINE HCL 25 MG PO TABS: 25.0000 mg | ORAL_TABLET | Freq: Four times a day (QID) | ORAL | Status: DC | PRN

## 2013-10-29 MED ORDER — ALBUTEROL SULFATE HFA 108 (90 BASE) MCG/ACT IN AERS: 2.0000 | INHALATION_SPRAY | RESPIRATORY_TRACT | Status: DC | PRN

## 2013-10-29 NOTE — Telephone Encounter (Signed)
Attempted to call pt; pt is in ER

## 2013-10-29 NOTE — Telephone Encounter (Signed)
Pt has come in today requesting an appt for chest pains and dizziness; pt says she has been experiencing sweating from her chest for 3 nights straight; pt is seeking advice about next step; please advise

## 2013-10-29 NOTE — ED Notes (Signed)
Pt c/o CP, SOB, dizziness, and diaphoresis x 3 days.

## 2013-10-29 NOTE — ED Provider Notes (Signed)
CSN: [id number]     Arrival date & time 10/29/13  1007 History   First MD Initiated Contact with Patient 10/29/13 1114     Chief Complaint  Patient presents with  . Chest Pain  . Shortness of Breath  . Dizziness     (Consider location/radiation/quality/duration/timing/severity/associated sxs/prior Treatment) HPI Comments: Patient presents to the ER for evaluation of chest pain, shortness of breath. Symptoms began 3 days ago. She reports a previous history of bronchitis with similar symptoms. Patient has been having some chills, diaphoresis and dizziness associated with the symptoms. She did use her albuterol inhaler yesterday and improved symptoms, has not used it today.  Patient is a 43 y.o. female presenting with chest pain, shortness of breath, and dizziness.  Chest Pain Associated symptoms: dizziness and shortness of breath   Shortness of Breath Associated symptoms: chest pain   Dizziness Associated symptoms: chest pain and shortness of breath     Past Medical History  Diagnosis Date  . Right inguinal hernia   . Arthritis   . CHF (congestive heart failure)     "unsure thinks lungs had fluid around after surgery 7 years ago"  . Chronic kidney disease     kidney infection  . Shortness of breath   . Asthma   . Neuromuscular disorder     HERNIA   Past Surgical History  Procedure Laterality Date  . Cesarean section      x2  . Tubal ligation    . Inguinal hernia repair  09/12/2011    Procedure: HERNIA REPAIR INGUINAL ADULT;  Surgeon: Gayland Curry, MD;  Location: [place] [facility];  Service: General;  Laterality: Right;  open right inguinal hernia with mesh   . Hernia repair  09/12/11    RIH   Family History  Problem Relation Age of Onset  . Ulcers Father     stomach  . Diabetes Brother   . Colon cancer Neg Hx   . Rectal cancer Neg Hx   . Stomach cancer Neg Hx    History  Substance Use Topics  . Smoking status: Current Every Day Smoker -- 0.50 packs/day for 20 years    Types:  Cigarettes  . Smokeless tobacco: Never Used     Comment: 3-8 CIGS A DAY  . Alcohol Use: No   OB History   Grav Para Term Preterm Abortions TAB SAB Ect Mult Living                 Review of Systems  Respiratory: Positive for shortness of breath.   Cardiovascular: Positive for chest pain.  Neurological: Positive for dizziness.  All other systems reviewed and are negative.      Allergies  Review of patient's allergies indicates no known allergies.  Home Medications   Current Outpatient Rx  Name  Route  Sig  Dispense  Refill  . Acetaminophen (TYLENOL PO)   Oral   Take 2 tablets by mouth once.          Marland Kitchen albuterol (PROVENTIL HFA;VENTOLIN HFA) 108 (90 BASE) MCG/ACT inhaler   Inhalation   Inhale 1-2 puffs into the lungs every 6 (six) hours as needed for wheezing.   1 Inhaler   0   . OVER THE COUNTER MEDICATION   Oral   Take 1 tablet by mouth at bedtime as needed (pain). Over the counter pain relief          LMP 10/29/2013 Physical Exam  Constitutional: She is oriented to person, place, and  time. She appears well-developed and well-nourished. No distress.  HENT:  Head: Normocephalic and atraumatic.  Right Ear: Hearing normal.  Left Ear: Hearing normal.  Nose: Nose normal.  Mouth/Throat: Oropharynx is clear and moist and mucous membranes are normal.  Eyes: Conjunctivae and EOM are normal. Pupils are equal, round, and reactive to light.  Neck: Normal range of motion. Neck supple.  Cardiovascular: Regular rhythm, S1 normal and S2 normal.  Exam reveals no gallop and no friction rub.   No murmur heard. Pulmonary/Chest: Effort normal and breath sounds normal. No respiratory distress. She exhibits no tenderness.  Abdominal: Soft. Normal appearance and bowel sounds are normal. There is no hepatosplenomegaly. There is no tenderness. There is no rebound, no guarding, no tenderness at McBurney's point and negative Murphy's sign. No hernia.  Musculoskeletal: Normal range of  motion.  Neurological: She is alert and oriented to person, place, and time. She has normal strength. No cranial nerve deficit or sensory deficit. Coordination normal. GCS eye subscore is 4. GCS verbal subscore is 5. GCS motor subscore is 6.  Skin: Skin is warm, dry and intact. No rash noted. No cyanosis.  Psychiatric: She has a normal mood and affect. Her speech is normal and behavior is normal. Thought content normal.    ED Course  Procedures (including critical care time) Labs Review Labs Reviewed  CBC - Abnormal; Notable for the following:    MCHC 36.2 (*)    All other components within normal limits  BASIC METABOLIC PANEL - Abnormal; Notable for the following:    Glucose, Bld 101 (*)    All other components within normal limits  PRO B NATRIURETIC PEPTIDE  D-DIMER, QUANTITATIVE  I-STAT TROPOININ, ED   Imaging Review Dg Chest 2 View  10/29/2013   CLINICAL DATA:  Shortness of breath and chest pain  EXAM: CHEST  2 VIEW  COMPARISON:  April 19, 2013  FINDINGS: There is slight central peribronchial thickening. The lungs are otherwise clear. The heart size and pulmonary vascularity are normal. No adenopathy. No pneumothorax. There is mild mid thoracic dextroscoliosis.  IMPRESSION: Slight central peribronchial thickening. Suspect a degree of bronchiolitis. No edema or consolidation.   Electronically Signed   By: Bretta Bang M.D.   On: 10/29/2013 10:54     EKG Interpretation   Date/Time:  Wednesday October 29 2013 10:09:42 EST Ventricular Rate:  66 PR Interval:  154 QRS Duration: 74 QT Interval:  396 QTC Calculation: 415 R Axis:   68 Text Interpretation:  Normal sinus rhythm Normal ECG Confirmed by Azarya Oconnell   MD, Kamee Bobst ([id number]) on 10/29/2013 11:35:04 AM      MDM   Final diagnoses:  None    Patient presents to the ER for evaluation of multiple complaints. Initially she was complaining of chest pain, cough, shortness of breath. She feels dizzy and then started to endorse  nausea, vomiting and diarrhea as well. Symptoms appear to be viral in nature. Her examination is unremarkable. Lungs are clear, x-ray negative. EKG was normal and troponin was negative. Patient has very low likelihood of PE and d-dimer was negative. She reports a history of congestive heart failure, but clinically does not have any signs of congestive heart failure and BNP was 40. Patient does indicate that she felt better yesterday with her breathing when she used the albuterol yesterday. We'll recommend continuing bronchodilator therapy. Additionally will add prednisone. Symptoms likely viral, no need for an antibiotic therapy. Will also treat her GI symptoms.    Canary Brim.  Betsey Holiday, MD 10/29/13 1243

## 2013-10-29 NOTE — Discharge Instructions (Signed)
Bronquitis (Bronchitis) La bronquitis es la hinchazn (inflamacin) de los conductos que transportan el aire hacia los pulmones (bronquios). Esto provoca mucosidad y tos. Si la hinchazn empeora, podra tener problemas para respirar. CUIDADOS EN EL HOGAR   Reposo.  Beba la cantidad suficiente de lquido para que la orina sea clara o Chief Executive Officer plida (a menos que tenga una afeccin en la que debe controlar cunto bebe).  Solo tome los medicamentos que le haya indicado su mdico. Si le administraron antibiticos, termnelos aunque comience a sentirse mejor.  Evite el humo, los productos qumicos irritantes y los olores fuertes. Estos elementos empeoran el problema. Si fuma, deje de hacerlo. De esta forma, ayudar a que sus pulmones sanen ms rpidamente.  Utilice un humidificador de vapor fro. Cambie el agua del humidificador a diario. Tambin puede sentarse en el bao, abrir la ducha caliente y dejarla correr durante 5 a 51minutos. Mantenga la puerta cerrada.  Visite a su mdico segn las indicaciones.  Lvese las manos con frecuencia. SOLICITE AYUDA SI: Sus problemas no mejoran despus de 1semana. SOLICITE AYUDA DE INMEDIATO SI:   La fiebre empeora.  Tiene escalofros.  Le duele el pecho.  Tiene mayor dificultad para respirar.  Observa sangre en la mucosidad.  Pierde el conocimiento (se desmaya).  Se siente mareado.  Tiene dolor de Control and instrumentation engineer.  Vomita repetidamente. ASEGRESE DE QUE:  Comprende estas instrucciones.  Controlar su afeccin.  Recibir ayuda de inmediato si no mejora o si empeora. Document Released: 11/10/2008 Document Revised: 06/04/2013 [facility] Patient Information 2014 [place], [place].  Infecciones virales  (Viral Infections)  Un virus es un tipo de germen. Puede causar:   Dolor de garganta leve.  Dolores musculares.  Dolor de [place].  Secrecin nasal.  Erupciones.  Lagrimeo.  Cansancio.  Tos.  Prdida del apetito.  Ganas de  vomitar (nuseas).  Vmitos.  Materia fecal lquida (diarrea). CUIDADOS EN EL HOGAR   Tome la medicacin slo como le haya indicado el mdico.  Beba gran cantidad de lquido para mantener la orina de tono claro o color amarillo plido. Las bebidas deportivas son Pamala Hurry eleccin.  Descanse lo suficiente y [employer]. Puede tomar sopas y caldos con crackers o arroz. SOLICITE AYUDA DE INMEDIATO SI:   Siente un dolor de cabeza muy intenso.  Le falta el aire.  Tiene dolor en el pecho o en el cuello.  Tiene una erupcin que no tena antes.  No puede detener los vmitos.  Tiene una hemorragia que no se detiene.  No puede retener los lquidos.  Usted o el nio tienen una temperatura oral le sube a ms de 38,9 C (102 F), y no puede bajarla con medicamentos.  Su beb tiene ms de 3 meses y su temperatura rectal es de 102 F (38.9 C) o ms.  Su beb tiene 3 meses o menos y su temperatura rectal es de 100.4 F (38 C) o ms. ASEGRESE DE QUE:   Comprende estas instrucciones.  Controlar la enfermedad.  Solicitar ayuda de inmediato si no mejora o si empeora. Document Released: 01/16/2011 Document Revised: 11/06/2011 [facility] Patient Information 2014 [place], [place].

## 2013-10-31 ENCOUNTER — Encounter: Payer: Self-pay | Admitting: Internal Medicine

## 2013-10-31 ENCOUNTER — Ambulatory Visit: Payer: No Typology Code available for payment source | Attending: Internal Medicine | Admitting: Internal Medicine

## 2013-10-31 VITALS — BP 118/72 | HR 79 | Temp 98.1°F | Resp 16

## 2013-10-31 DIAGNOSIS — J209 Acute bronchitis, unspecified: Secondary | ICD-10-CM

## 2013-10-31 MED ORDER — GUAIFENESIN ER 600 MG PO TB12: 600.0000 mg | ORAL_TABLET | Freq: Two times a day (BID) | ORAL | Status: DC

## 2013-10-31 MED ORDER — VARENICLINE TARTRATE 1 MG PO TABS: 0.5000 mg | ORAL_TABLET | Freq: Two times a day (BID) | ORAL | Status: DC

## 2013-10-31 NOTE — Progress Notes (Unsigned)
Patient ID: Eileen [name], female   DOB: 03-24-1971, 43 y.o.   MRN: [id number]   HPI: Eileen [name] is a 43 y.o. female presenting on 10/31/2013 with a cough which was diagnosed as bronchitis 2 days ago in the ER. She was placed on Prednisone and Promethazine. She still does not feel well and is having muscles aches and weakness. Unable to bring up the sputum. No fevers. She is a smoker and would like assistance with quitting.     Past Medical History  Diagnosis Date  . Right inguinal hernia   . Arthritis   . CHF (congestive heart failure)     "unsure thinks lungs had fluid around after surgery 7 years ago"  . Chronic kidney disease     kidney infection  . Shortness of breath   . Asthma   . Neuromuscular disorder     HERNIA    Past Surgical History  Procedure Laterality Date  . Cesarean section      x2  . Tubal ligation    . Inguinal hernia repair  09/12/2011    Procedure: HERNIA REPAIR INGUINAL ADULT;  Surgeon: Gayland Curry, MD;  Location: [place];  Service: General;  Laterality: Right;  open right inguinal hernia with mesh   . Hernia repair  09/12/11    RIH    Current Outpatient Prescriptions  Medication Sig Dispense Refill  . albuterol (PROVENTIL HFA;VENTOLIN HFA) 108 (90 BASE) MCG/ACT inhaler Inhale 2 puffs into the lungs every 4 (four) hours as needed for wheezing or shortness of breath.  1 Inhaler  0  . predniSONE (DELTASONE) 20 MG tablet Take 3 tablets (60 mg total) by mouth daily with breakfast.  15 tablet  0  . promethazine (PHENERGAN) 25 MG tablet Take 1 tablet (25 mg total) by mouth every 6 (six) hours as needed for nausea or vomiting.  15 tablet  0  . Acetaminophen (TYLENOL PO) Take 2 tablets by mouth once.       Marland Kitchen albuterol (PROVENTIL HFA;VENTOLIN HFA) 108 (90 BASE) MCG/ACT inhaler Inhale 1-2 puffs into the lungs every 6 (six) hours as needed for wheezing.  1 Inhaler  0  . diphenoxylate-atropine (LOMOTIL) 2.5-0.025 MG per tablet Take 1 tablet by mouth 4  (four) times daily as needed for diarrhea or loose stools.  15 tablet  0  . guaiFENesin (MUCINEX) 600 MG 12 hr tablet Take 1 tablet (600 mg total) by mouth 2 (two) times daily.  60 tablet  0  . OVER THE COUNTER MEDICATION Take 1 tablet by mouth at bedtime as needed (pain). Over the counter pain relief       No current facility-administered medications for this visit.    No Known Allergies  Family History  Problem Relation Age of Onset  . Ulcers Father     stomach  . Diabetes Brother   . Colon cancer Neg Hx   . Rectal cancer Neg Hx   . Stomach cancer Neg Hx     History   Social History  . Marital Status: Legally Separated    Spouse Name: N/A    Number of Children: 4  . Years of Education: N/A   Occupational History  . Cleaning    Social History Main Topics  . Smoking status: Current Every Day Smoker -- 0.50 packs/day for 20 years    Types: Cigarettes  . Smokeless tobacco: Never Used     Comment: 3-8 CIGS A DAY  . Alcohol Use: No  . Drug Use:  No  . Sexual Activity: Not Currently    Birth Control/ Protection: None   Other Topics Concern  . Not on file   Social History Narrative  . No narrative on file    Review of Systems  Review of Systems  Constitutional: Negative for fever, chills, diaphoresis, activity change, appetite change and fatigue.  HENT: Negative for ear pain, nosebleeds, congestion, facial swelling, rhinorrhea, neck pain, neck stiffness and ear discharge.  Eyes: Negative for pain, discharge, redness, itching and visual disturbance.  Respiratory: + cough, choking, chest tightness, shortness of breath, wheezing and stridor.  Cardiovascular: Negative for chest pain, palpitations and leg swelling.  Gastrointestinal: Negative for abdominal distention, vomiting, diarrhea or consitpation Genitourinary: Negative for dysuria, urgency, frequency, hematuria, flank pain, decreased urine volume, difficulty urinating and dyspareunia.  Musculoskeletal: Negative for  back pain, joint swelling, arthralgias or gait problem.  Neurological: Negative for dizziness, tremors, seizures, syncope, facial asymmetry, speech difficulty, weakness, light-headedness, numbness and headaches.  Hematological: Negative for adenopathy. Does not bruise/bleed easily.  Psychiatric/Behavioral: Negative for hallucinations, behavioral problems, confusion, dysphoric mood   Objective:  BP 118/72  Pulse 79  Temp(Src) 98.1 F (36.7 C) (Oral)  Resp 16  LMP 10/29/2013 There were no vitals filed for this visit.   Physical Exam  Constitutional: Appears well-developed and well-nourished. No distress. HENT: Normocephalic. External right and left ear normal. Oropharynx is clear and moist.  Eyes: Conjunctivae and EOM are normal. PERRLA, no scleral icterus.  Neck: Normal ROM. Neck supple. No JVD. No tracheal deviation. No thyromegaly.  CVS: RRR, S1/S2 +, no murmurs, no gallops, no carotid bruit.  Pulmonary: Effort and breath sounds normal, no stridor, rhonchi, wheezes, rales.  Abdominal: Soft. BS +,  no distension, tenderness, rebound or guarding.  Musculoskeletal: Normal range of motion. No edema and no tenderness.  Neuro: Alert. Normal reflexes, muscle tone coordination. No cranial nerve deficit. Skin: Skin is warm and dry. No rash noted. Not diaphoretic. No erythema. No pallor.  Psychiatric: Normal mood and affect. Behavior, judgment, thought content normal.   Lab Results  Component Value Date   WBC 6.9 10/29/2013   HGB 13.7 10/29/2013   HCT 37.8 10/29/2013   MCV 89.6 10/29/2013   PLT 276 10/29/2013   Lab Results  Component Value Date   CREATININE 0.61 10/29/2013   BUN 13 10/29/2013   NA 141 10/29/2013   K 4.2 10/29/2013   CL 105 10/29/2013   CO2 23 10/29/2013    No results found for this basename: HGBA1C   Lipid Panel     Component Value Date/Time   CHOL 159 06/28/2010 2048   TRIG 132 06/28/2010 2048   HDL 40 06/28/2010 2048   CHOLHDL 4.0 Ratio 06/28/2010 2048   VLDL 26 06/28/2010 2048    LDLCALC 93 06/28/2010 2048        Patient Active Problem List   Diagnosis Date Noted  . Current smoker 09/26/2013  . Nausea with vomiting 05/01/2013  . Dyspepsia 05/01/2013     Preventative Medicine:  Health Maintenance  Topic Date Due  . Tetanus/tdap  08/19/1991  . Influenza Vaccine  03/28/2013  . Pap Smear  09/26/2016  . Colonoscopy  09/23/2018    Adult vaccines due  Topic Date Due  . Tetanus/tdap  08/19/1991      Assessment and plan: Acute bronchitis - cont prednisone- add mucinex - aggressive hydration  Smoker - start chantix  Muscle Aches - Ibuprofen or Tylenol    The patient was given clear instructions to  go to ER or return to medical center if symptoms don't improve, worsen or new problems develop. The patient verbalized understanding. The patient was told to call to get lab results if they haven't heard anything in the next week.     Debbe Odea, MD

## 2013-10-31 NOTE — Progress Notes (Unsigned)
Pt here to f/u Bronchitis, seen in Er and treated with Prednisone  Cough noted with chest tightness

## 2013-11-05 ENCOUNTER — Encounter: Payer: Self-pay | Admitting: Internal Medicine

## 2013-11-07 ENCOUNTER — Encounter: Payer: Self-pay | Admitting: Internal Medicine

## 2013-11-07 ENCOUNTER — Ambulatory Visit (INDEPENDENT_AMBULATORY_CARE_PROVIDER_SITE_OTHER): Payer: No Typology Code available for payment source | Admitting: Internal Medicine

## 2013-11-07 VITALS — BP 100/58 | HR 92 | Ht 60.75 in | Wt 163.1 lb

## 2013-11-07 DIAGNOSIS — K5730 Diverticulosis of large intestine without perforation or abscess without bleeding: Secondary | ICD-10-CM | POA: Insufficient documentation

## 2013-11-07 DIAGNOSIS — Z86010 Personal history of colonic polyps: Secondary | ICD-10-CM

## 2013-11-07 DIAGNOSIS — G8929 Other chronic pain: Secondary | ICD-10-CM

## 2013-11-07 DIAGNOSIS — M545 Low back pain, unspecified: Secondary | ICD-10-CM

## 2013-11-07 DIAGNOSIS — R1031 Right lower quadrant pain: Secondary | ICD-10-CM

## 2013-11-07 MED ORDER — POLYETHYLENE GLYCOL 3350 17 G PO PACK: 17.0000 g | PACK | Freq: Every day | ORAL | Status: DC

## 2013-11-07 NOTE — Progress Notes (Signed)
Subjective:    Patient ID: Eileen [name], female    DOB: [birth year], 43 y.o.   MRN: [id number]  HPI Eileen [name] is a 42 yo female with PMH of mild asthma and history of right inguinal hernia status post repair, adenomatous colon polyp and right-sided diverticulosis who seen in followup. She was initially seen to evaluate chronic right lower quadrant abdominal pain. She came for upper endoscopy and colonoscopy on 09/23/2013. Upper endoscopy was normal. Colonoscopy to the terminal ileum revealed 1 small ascending colon tubular adenoma and diverticulosis from the cecum to the sigmoid. Her pain had improved with colon purge/preparation and she was given a trial of Linzess 145 mcg daily. She reports this caused significant diarrhea which she felt was too frequent to tolerate though it did seem to help her right lower quadrant abdominal pain. She is no longer using this medication. She is still having right lower quadrant abdominal pain which is worse with movement such as riding in car. Doesn't seem to relate to eating. She's had no fevers or chills. No blood in her stool or melena. Appetite has been good. No heartburn, dysphagia or odynophagia. No nausea or vomiting. She does report bilateral lower back pain which is worse after working. 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     Objective:   Physical Exam BP 100/58  Pulse 92  Ht 5' 0.75" (1.543 m)  Wt 163 lb 2 oz (73.993 kg)  BMI 31.08 kg/m2  LMP 10/29/2013 Constitutional: Well-developed and well-nourished. No distress. HEENT: Normocephalic and atraumatic.No scleral icterus. Neck: Neck supple. Trachea midline. Cardiovascular: Normal rate, regular rhythm and  intact distal pulses. No M/R/G Pulmonary/chest: Effort normal and breath sounds normal. No wheezing, rales or rhonchi. Abdominal: Soft, mild RLQ tenderness without rebound or guarding, nondistended. Bowel sounds active throughout. Extremities: no clubbing, cyanosis, or edema Neurological: Alert and oriented to person place and time. Psychiatric: Normal mood and affect. Behavior is normal.  CBC    Component Value Date/Time   WBC 6.9 10/29/2013 1042   RBC 4.22 10/29/2013 1042   HGB 13.7 10/29/2013 1042   HCT 37.8 10/29/2013 1042   PLT 276 10/29/2013 1042   MCV 89.6 10/29/2013 1042   MCH 32.5 10/29/2013 1042   MCHC 36.2* 10/29/2013 1042   RDW 14.1 10/29/2013 1042   LYMPHSABS 2.6 07/18/2013 1757   MONOABS 0.7 07/18/2013 1757   EOSABS 0.1 07/18/2013 1757   BASOSABS 0.0 07/18/2013 1757    CMP     Component Value Date/Time   NA 141 10/29/2013 1042   K 4.2 10/29/2013 1042   CL 105 10/29/2013 1042   CO2 23 10/29/2013 1042   GLUCOSE 101* 10/29/2013 1042   BUN 13 10/29/2013 1042   CREATININE 0.61 10/29/2013 1042   CREATININE 0.70 07/18/2013 1757   CALCIUM 9.3 10/29/2013 1042   PROT 6.8 07/18/2013 1757   ALBUMIN 4.3 07/18/2013 1757   AST 10 07/18/2013 1757   ALT 11 07/18/2013 1757   ALKPHOS 64 07/18/2013 1757   BILITOT 0.4 07/18/2013 1757   GFRNONAA >90 10/29/2013 1042   GFRAA >90 10/29/2013 1042    CT abd pelvis -- Dec 2014 - reviewed and discussed with pt     Assessment & Plan:  43 yo female with PMH  of mild asthma and history of right inguinal hernia status post repair, adenomatous colon polyp and right-sided diverticulosis who seen in followup.  1.  RLQ  abd pain, chronic -- her pain seems somewhat functional in nature but does seem to respond to laxatives and more frequent bowel movement. There is no evidence for diverticulitis at present. The colonic mucosa was unremarkable other than diverticulosis and her CT did not reveal a source for her chronic right lower quadrant abdominal pain. Given that it  improves with more frequent bowel movement I will start her on MiraLax 17 g daily. If her stools are too loose she can take this every other day but we discussed how she would need this on a scheduled basis to provide benefit. Linzess seem to help the pain but resulted in too much diarrhea.  2.  Lower back pain -- likely muscle strain related to physical labor. Will give prescription for tramadol 50 mg every 6 hours to be used as needed for pain. I asked that she only uses this for moderate to severe pain rather than on continuous basis. She voices understanding  3.  Adenomatous colon polyp -- repeat colonoscopy recommended in 5 years. She is aware of this recommendation  Return in 3-4 months to see her she is doing

## 2013-11-07 NOTE — Patient Instructions (Addendum)
You have been given a separate informational sheet regarding your tobacco use, the importance of quitting and local resources to help you quit. We have sent the following medications to your pharmacy for you to pick up at your convenience: Miralax 17 g daily  Follow up with Dr. Norman Herrlich in office in 3-4 months

## 2014-04-09 IMAGING — CR DG CHEST 2V
2 series · 2 of 2 positions shown · non-contrast
Comparison: 04/04/2013.

CLINICAL DATA: Chest pain and cough.

EXAM:
CHEST  2 VIEW

[w chest pa]
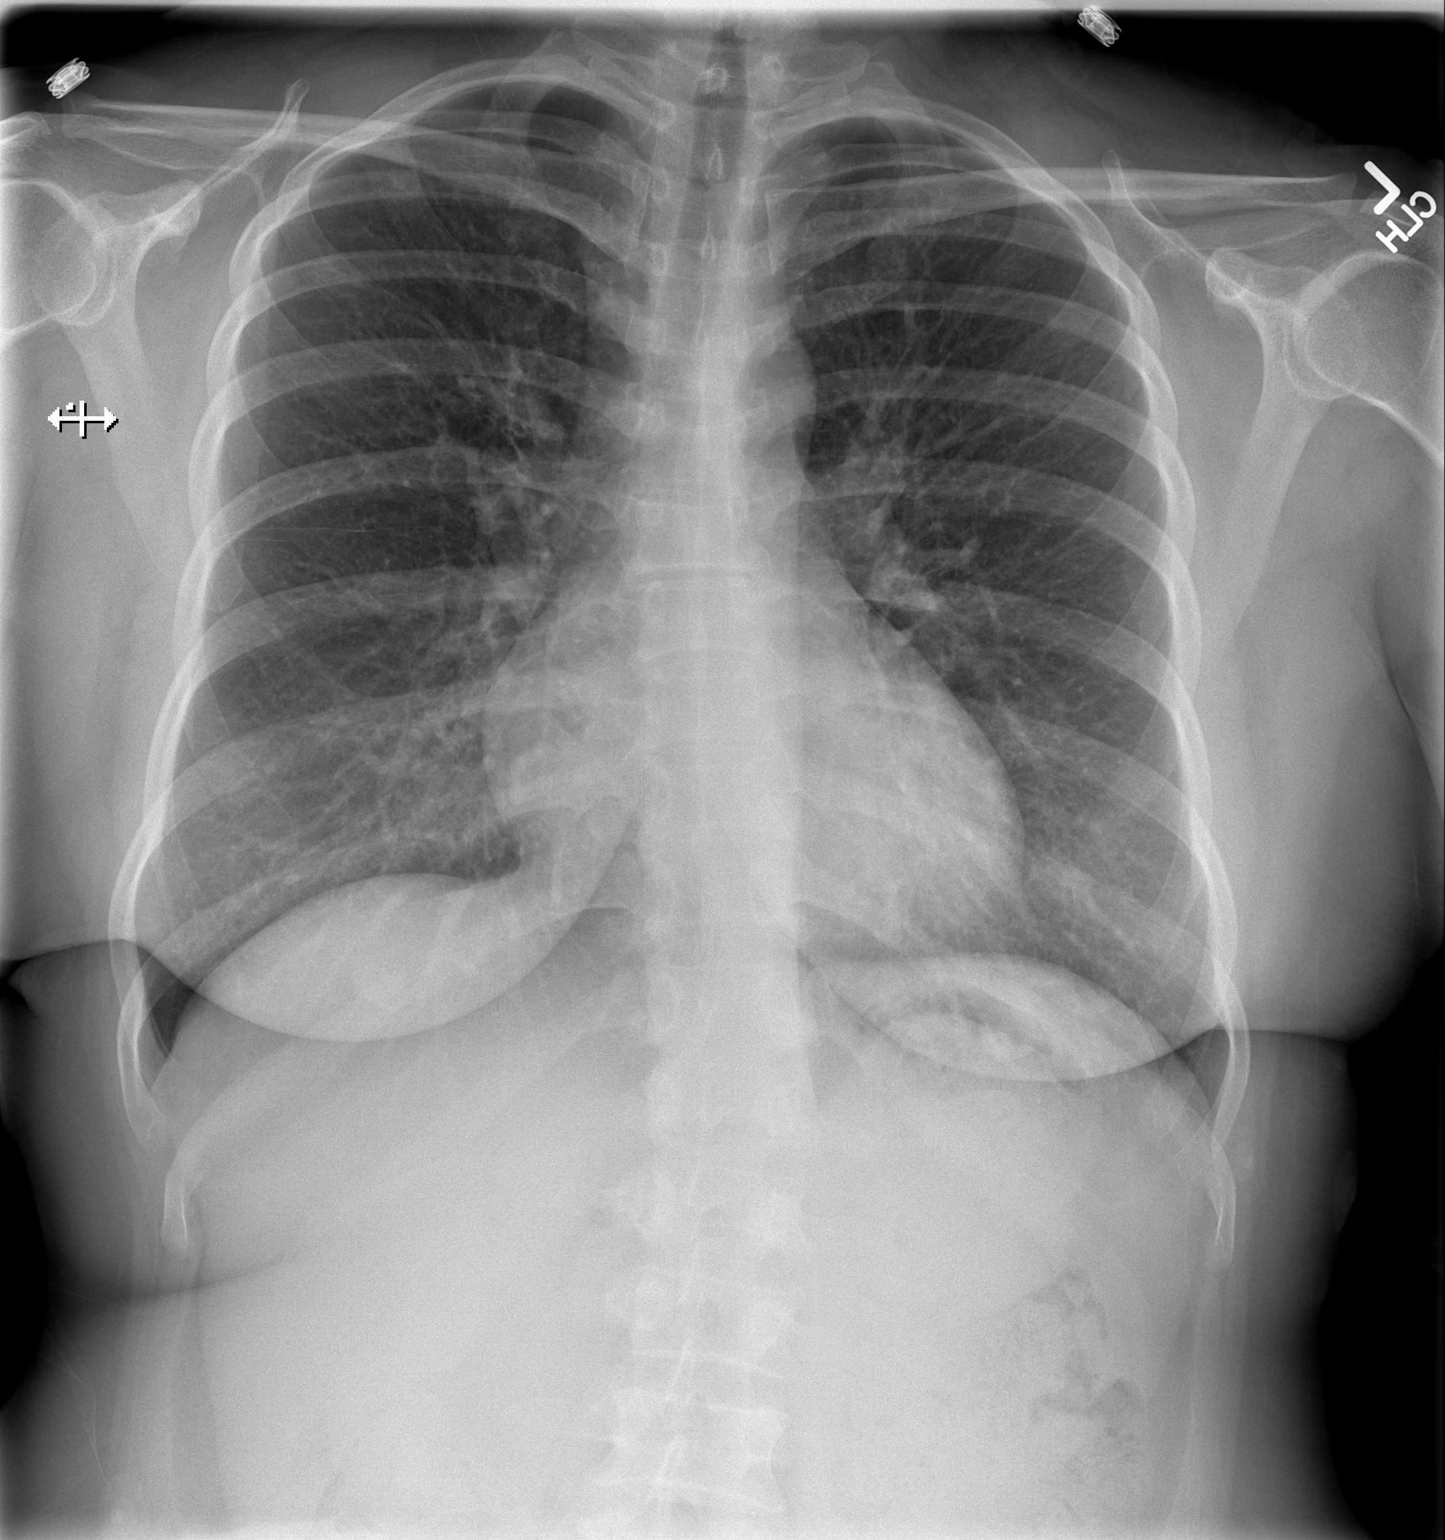

[w chest lat]
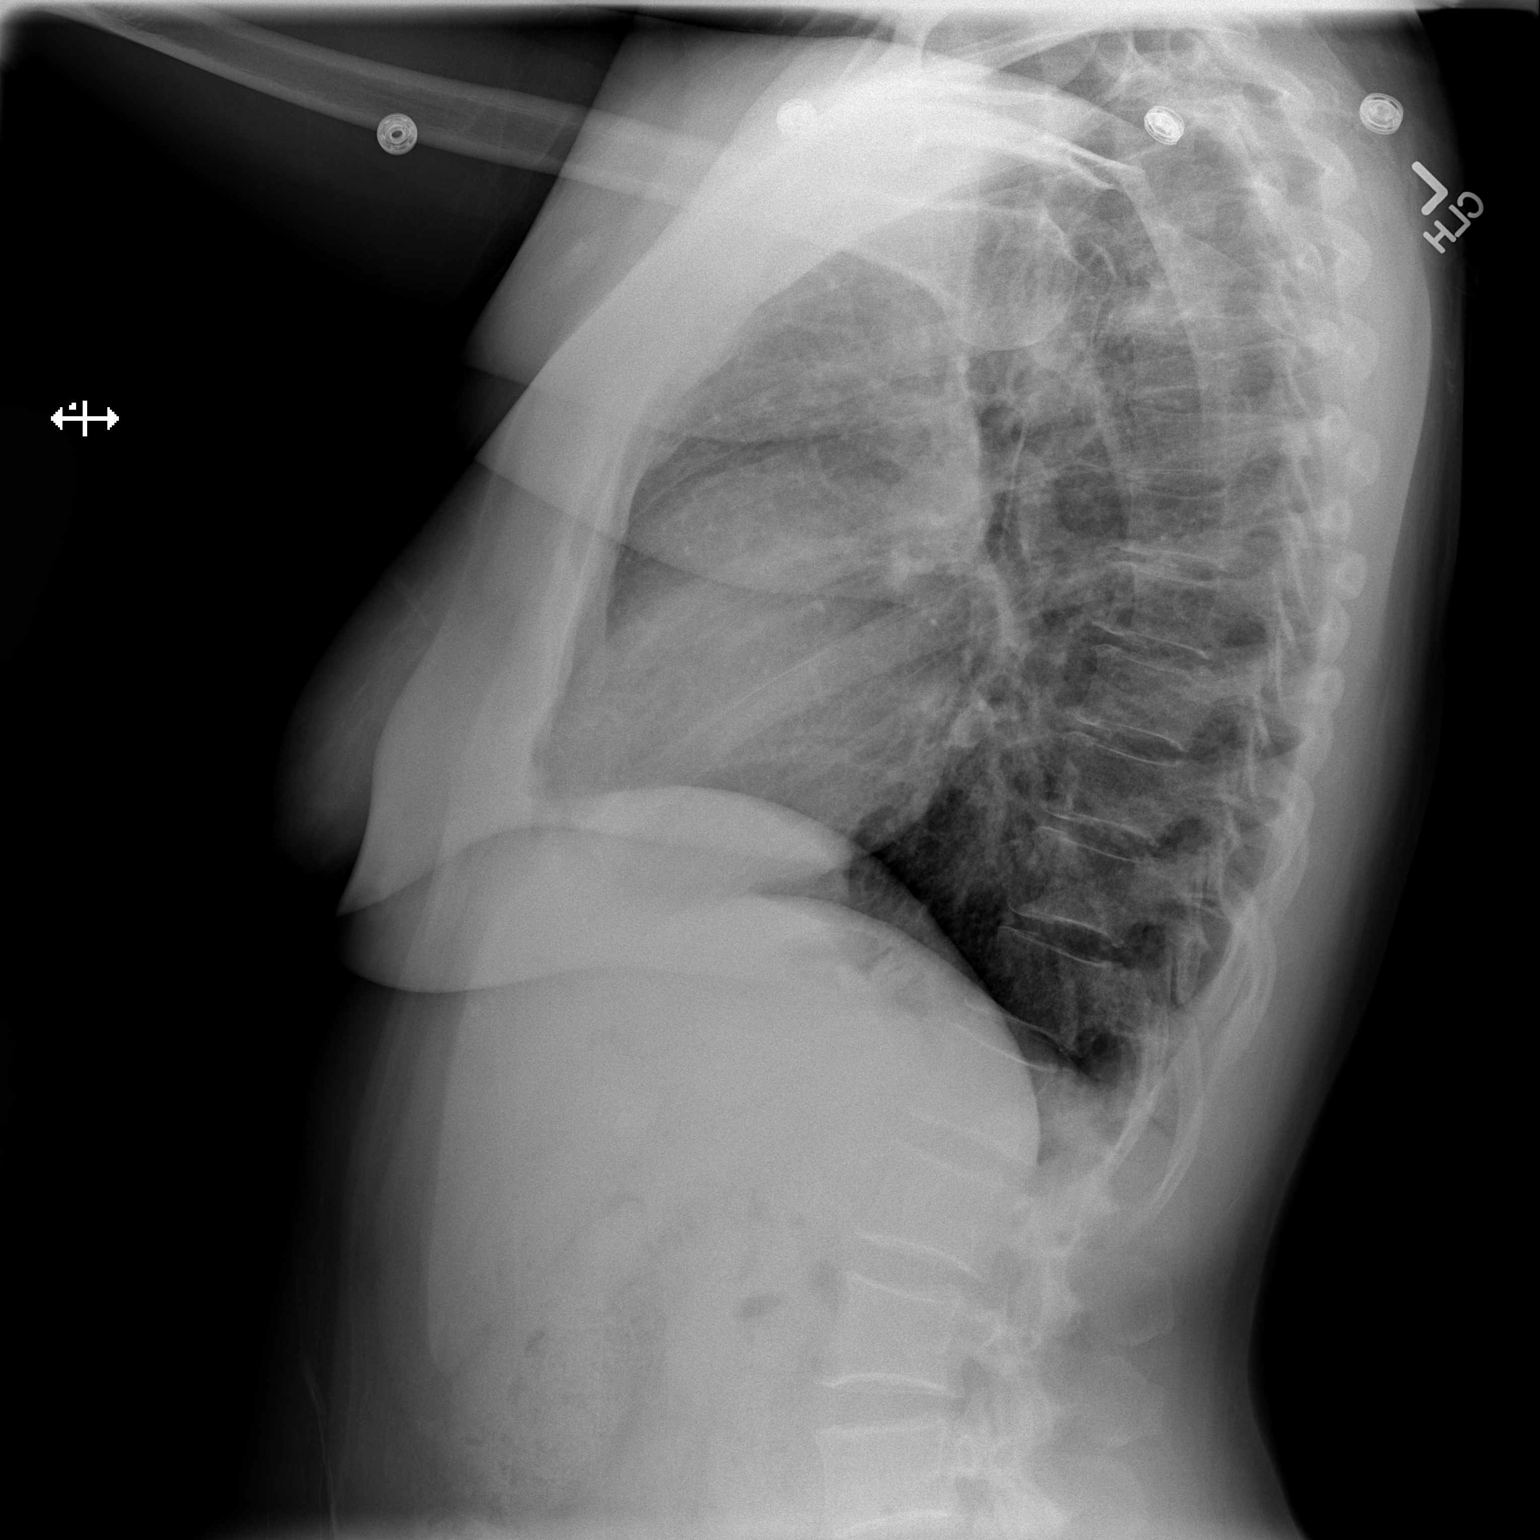

[2 of 2 positions shown; findings below may reference images not displayed]

FINDINGS: Normal sized heart. Clear lungs. Mild diffuse peribronchial
thickening with mild progression. Stable mild scoliosis.
IMPRESSION: Mild bronchitic changes with mild progression.

## 2014-05-01 ENCOUNTER — Encounter (HOSPITAL_COMMUNITY): Payer: Self-pay | Admitting: Emergency Medicine

## 2014-05-01 ENCOUNTER — Emergency Department (HOSPITAL_COMMUNITY)
Admission: EM | Admit: 2014-05-01 | Discharge: 2014-05-01 | Disposition: A | Discharge status: Home / Self Care | Payer: Self-pay | Attending: Emergency Medicine | Admitting: Emergency Medicine

## 2014-05-01 DIAGNOSIS — K089 Disorder of teeth and supporting structures, unspecified: Secondary | ICD-10-CM | POA: Insufficient documentation

## 2014-05-01 DIAGNOSIS — Z8719 Personal history of other diseases of the digestive system: Secondary | ICD-10-CM | POA: Insufficient documentation

## 2014-05-01 DIAGNOSIS — K08109 Complete loss of teeth, unspecified cause, unspecified class: Secondary | ICD-10-CM | POA: Insufficient documentation

## 2014-05-01 DIAGNOSIS — K0889 Other specified disorders of teeth and supporting structures: Secondary | ICD-10-CM

## 2014-05-01 DIAGNOSIS — I509 Heart failure, unspecified: Secondary | ICD-10-CM | POA: Insufficient documentation

## 2014-05-01 DIAGNOSIS — Z79899 Other long term (current) drug therapy: Secondary | ICD-10-CM | POA: Insufficient documentation

## 2014-05-01 DIAGNOSIS — J45909 Unspecified asthma, uncomplicated: Secondary | ICD-10-CM | POA: Insufficient documentation

## 2014-05-01 DIAGNOSIS — N189 Chronic kidney disease, unspecified: Secondary | ICD-10-CM | POA: Insufficient documentation

## 2014-05-01 DIAGNOSIS — Z8669 Personal history of other diseases of the nervous system and sense organs: Secondary | ICD-10-CM | POA: Insufficient documentation

## 2014-05-01 DIAGNOSIS — M129 Arthropathy, unspecified: Secondary | ICD-10-CM | POA: Insufficient documentation

## 2014-05-01 DIAGNOSIS — F17200 Nicotine dependence, unspecified, uncomplicated: Secondary | ICD-10-CM | POA: Insufficient documentation

## 2014-05-01 MED ORDER — PENICILLIN V POTASSIUM 500 MG PO TABS: 500.0000 mg | ORAL_TABLET | Freq: Four times a day (QID) | ORAL | Status: AC

## 2014-05-01 NOTE — ED Provider Notes (Signed)
CSN: [id number]     Arrival date & time 05/01/14  3875 History  This chart was scribed for non-physician practitioner, Hyman Bible, PA-C, working with Debby Freiberg, MD by Ladene Artist, ED Scribe. This patient was seen in room TR08C/TR08C and the patient's care was started at 9:39 AM.   Chief Complaint  Patient presents with  . Dental Pain   The history is provided by the patient. No language interpreter was used.   HPI Comments: Eileen [name] is a 43 y.o. female who presents to the Emergency Department complaining of gradually worsening dental pain since yesterday. Pt had a tooth extracted yesterday at [facility]. She reports associated mild facial swelling. She denies fever, chills, nausea, vomiting, or difficulty swallowing. Pt takes 800 mg ibuprofen and Norco every 2 hours due to severity of pain.    Past Medical History  Diagnosis Date  . Right inguinal hernia   . Arthritis   . CHF (congestive heart failure)     "unsure thinks lungs had fluid around after surgery 7 years ago"  . Chronic kidney disease     kidney infection  . Shortness of breath   . Asthma   . Neuromuscular disorder     HERNIA   Past Surgical History  Procedure Laterality Date  . Cesarean section      x2  . Tubal ligation    . Inguinal hernia repair  09/12/2011    Procedure: HERNIA REPAIR INGUINAL ADULT;  Surgeon: Gayland Curry, MD;  Location: [place];  Service: General;  Laterality: Right;  open right inguinal hernia with mesh   . Hernia repair  09/12/11    RIH   Family History  Problem Relation Age of Onset  . Ulcers Father     stomach  . Diabetes Brother   . Colon cancer Neg Hx   . Rectal cancer Neg Hx   . Stomach cancer Neg Hx    History  Substance Use Topics  . Smoking status: Current Every Day Smoker -- 0.50 packs/day for 20 years    Types: Cigarettes  . Smokeless tobacco: Never Used     Comment: 3-8 CIGS A DAY  . Alcohol Use: No   OB History   Grav Para Term Preterm  Abortions TAB SAB Ect Mult Living                 Review of Systems  Constitutional: Negative for fever.  HENT: Positive for dental problem and facial swelling (mild). Negative for trouble swallowing.   All other systems reviewed and are negative.  Allergies  Review of patient's allergies indicates no known allergies.  Home Medications   Prior to Admission medications   Medication Sig Start Date End Date Taking? Authorizing Provider  Acetaminophen (TYLENOL PO) Take 2 tablets by mouth once.     Historical Provider, MD  albuterol (PROVENTIL HFA;VENTOLIN HFA) 108 (90 BASE) MCG/ACT inhaler Inhale 2 puffs into the lungs every 4 (four) hours as needed for wheezing or shortness of breath. 10/29/13   Orpah Greek, MD  polyethylene glycol [facility]) packet Take 17 g by mouth daily. 11/07/13   Jerene Bears, MD   Triage Vitals: BP 120/79  Pulse 67  Temp(Src) 98.3 F (36.8 C) (Oral)  Resp 18  SpO2 97% Physical Exam  Nursing note and vitals reviewed. Constitutional: She is oriented to person, place, and time. She appears well-developed and well-nourished.  HENT:  Head: Normocephalic and atraumatic.  Mouth: Extracted tooth of L lower gingiva  No drainage, abscess, erythema or edema of the gingiva Gingiva tender to palpation No evidence of dry socket at this time.  Eyes: Conjunctivae and EOM are normal.  Neck: Normal range of motion. Neck supple.  Cardiovascular: Normal rate and normal heart sounds.   Pulmonary/Chest: Effort normal and breath sounds normal.  Musculoskeletal: Normal range of motion.  Neurological: She is alert and oriented to person, place, and time.  Skin: Skin is warm and dry.  Psychiatric: She has a normal mood and affect. Her behavior is normal.   ED Course  Procedures (including critical care time) DIAGNOSTIC STUDIES: Oxygen Saturation is 97% on RA, normal by my interpretation.    COORDINATION OF CARE: 9:47 AM-Discussed treatment plan which includes  follow-up with dentist with pt at bedside and pt agreed to plan.   Labs Review Labs Reviewed - No data to display  Imaging Review No results found.   EKG Interpretation None      MDM   Final diagnoses:  None   Patient presenting with dental pain.  She had her tooth extracted by dentist yesterday.  She has Ibuprofen 800 mg and Norco for pain.  Patient instructed to continue taking her pain medication.  No evidence of an infection.  Patient instructed to follow up with her dentist.      Hyman Bible, PA-C 05/01/14 1105

## 2014-05-01 NOTE — ED Notes (Signed)
Nira Conn, PA at bedside for evaluation.

## 2014-05-01 NOTE — ED Notes (Signed)
Patient states had tooth removed yesterday and is still having pain.  Patient states she did not call her dentist in regards to same.

## 2014-05-02 NOTE — ED Provider Notes (Signed)
Medical screening examination/treatment/procedure(s) were performed by non-physician practitioner and as supervising physician I was immediately available for consultation/collaboration.   EKG Interpretation None        Debby Freiberg, MD 05/02/14 1429

## 2014-06-05 ENCOUNTER — Telehealth: Payer: Self-pay | Admitting: Internal Medicine

## 2014-06-05 NOTE — Telephone Encounter (Signed)
Patient came on March 6 and got a prescription for Ventolin HFA. She hasn't been here since and she needs a refill ASAP. Patient is waiting in the hall. Please follow up with Patient

## 2014-06-08 ENCOUNTER — Other Ambulatory Visit: Payer: Self-pay | Admitting: Emergency Medicine

## 2014-06-08 MED ORDER — ALBUTEROL SULFATE HFA 108 (90 BASE) MCG/ACT IN AERS: 2.0000 | INHALATION_SPRAY | RESPIRATORY_TRACT | Status: DC | PRN

## 2014-09-24 ENCOUNTER — Encounter: Payer: Self-pay | Admitting: Internal Medicine

## 2014-09-24 ENCOUNTER — Ambulatory Visit: Payer: Self-pay | Attending: Internal Medicine | Admitting: Internal Medicine

## 2014-09-24 VITALS — BP 111/70 | HR 74 | Temp 98.3°F | Resp 16 | Ht 64.0 in | Wt 169.0 lb

## 2014-09-24 DIAGNOSIS — J45909 Unspecified asthma, uncomplicated: Secondary | ICD-10-CM | POA: Insufficient documentation

## 2014-09-24 DIAGNOSIS — F17210 Nicotine dependence, cigarettes, uncomplicated: Secondary | ICD-10-CM | POA: Insufficient documentation

## 2014-09-24 DIAGNOSIS — I509 Heart failure, unspecified: Secondary | ICD-10-CM | POA: Insufficient documentation

## 2014-09-24 DIAGNOSIS — M1990 Unspecified osteoarthritis, unspecified site: Secondary | ICD-10-CM | POA: Insufficient documentation

## 2014-09-24 DIAGNOSIS — Z720 Tobacco use: Secondary | ICD-10-CM

## 2014-09-24 DIAGNOSIS — G709 Myoneural disorder, unspecified: Secondary | ICD-10-CM | POA: Insufficient documentation

## 2014-09-24 DIAGNOSIS — N644 Mastodynia: Secondary | ICD-10-CM

## 2014-09-24 DIAGNOSIS — Z23 Encounter for immunization: Secondary | ICD-10-CM

## 2014-09-24 DIAGNOSIS — N189 Chronic kidney disease, unspecified: Secondary | ICD-10-CM | POA: Insufficient documentation

## 2014-09-24 DIAGNOSIS — F17200 Nicotine dependence, unspecified, uncomplicated: Secondary | ICD-10-CM

## 2014-09-24 DIAGNOSIS — J4520 Mild intermittent asthma, uncomplicated: Secondary | ICD-10-CM

## 2014-09-24 DIAGNOSIS — Z79899 Other long term (current) drug therapy: Secondary | ICD-10-CM | POA: Insufficient documentation

## 2014-09-24 MED ORDER — ALBUTEROL SULFATE HFA 108 (90 BASE) MCG/ACT IN AERS: 2.0000 | INHALATION_SPRAY | RESPIRATORY_TRACT | Status: DC | PRN

## 2014-09-24 NOTE — Progress Notes (Signed)
Pt is here c/o pain in her left breast and nipple that she has had for 15 days. Pt is requesting another refill for her inhaler. Pt has an interpreter.

## 2014-09-24 NOTE — Patient Instructions (Signed)
Dejar de fumar (Smoking Cessation) Dejar de fumar es importante para su salud y tiene Product/process development scientist. Sin embargo, no siempre es Control and instrumentation engineer de fumar ya que la nicotina es una droga [place]. Generalmente, las personas intentan 3 veces o ms antes de poder dejar de fumar. En este documento se explican las mejores formas de prepararse para dejar de fumar. Esta decisin requiere [employer] y mucho esfuerzo, pero usted puede [place]. [place]  Vivir ms, se sentir mejor y vivir mejor.  El cuerpo sentir el impacto de dejar de fumar de inmediato.  Luego de 20 minutos la presin arterial disminuye. El pulso vuelve a su nivel normal.  Despus de 8 horas, los niveles de monxido de carbono en la sangre vuelven a la normalidad. Aumenta el nivel de oxgeno.  Despus de 24 horas, la probabilidad de infarto comienza a disminuir. La respiracin, el cabello y el cuerpo ya no huelen a humo.  Luego de 48 horas, los nervios daados comienzan a recuperarse. Mejoran el sentido del gusto y Armed forces logistics/support/administrative officer.  Luego de 72 horas, el organismo est virtualmente libre de nicotina. Los conductos bronquiales se relajan, la respiracin se normaliza.  Despus de 2 a 12 semanas, los pulmones pueden contener ms aire. Es ms fcil realizar actividad fsica y [employer] respiracin.  El riesgo de sufrir un infarto, un ictus, cncer o enfermedad pulmonar disminuye en gran medida.  Despus de 1 ao, el riesgo de coronariopatas disminuye a la mitad.  Despus de 5 aos, el riesgo de ictus disminuye al nivel de un no fumador.  Despus de 10 aos, el riesgo de cncer de pulmn disminuye a la mitad, y el riesgo de sufrir otros tipos de cncer disminuye considerablemente.  Despus de 15 aos, el riesgo de enfermedad coronaria disminuye, generalmente al nivel de un no fumador.  Si est embarazada, al dejar de fumar aumentar las probabilidades de tener un beb sano.  Las personas con las que convive,  [employer] nios, estarn ms saludables.  Tendr dinero extra para gastar en otras cosas que no sean cigarrillos. PREGUNTAS PARA PENSAR ANTES DE Allena Katz DEJAR DE FUMAR Quizs desee hablar acerca de sus respuestas con el mdico.  Por qu desea dejar de fumar?  Cuando trat de dejar de fumar en el pasado, qu lo ayud y qu no lo ayud?  Cules sern las situaciones ms difciles para usted despus de dejar de fumar? Cmo planea manejarlas?  Quin puede ayudarlo en los momentos difciles? Su familia? Sus amigos? El mdico?  Qu placeres obtiene cuando fuma? De qu manera puede seguir obteniendo placer si abandona el hbito? Estas son algunas preguntas que puede hacerle al mdico:  Cmo puede ayudarme a dejar de fumar con xito?  Qu medicamento cree que sera el mejor para m, y cmo debo tomarlo?  Qu debo hacer si necesito ms ayuda?  Cmo es la desintoxicacin del cigarrillo? Cmo puedo obtener informacin acerca de la desintoxicacin? PREPRESE  Establezca una fecha para dejar de fumar.  Cambie su entorno, deshacindose de los cigarrillos, ceniceros, fsforos y encendedores en su casa, el auto o el [place]. No permita que nadie fume dentro de su casa.  Repase sus intentos anteriores. Piense en qu cosas funcionaron y cules no. BUSQUE AYUDA Y ESTMULO Usted tiene mejores probabilidades de tener xito si cuenta con ayuda. Puede obtener apoyo de [employer]:  Dgales a sus familiares, amigos y compaeros de trabajo que usted dejar de fumar y que necesita su apoyo.  Pdales que no fumen a su alrededor.  Obtenga consejo y 77 individual, grupal o telefnico. Hay programas que se ofrecen en hospitales y centros mdicos locales. Comunquese con el departamento de salud de su localidad para obtener informacin acerca de los programas disponibles en su rea.  Las creencias y prcticas espirituales pueden ayudar a los fumadores a abandonar el  hbito.  Descargue en su computadora un programa que registre sus estadsticas, por ejemplo, cunto hace que no fuma, la cantidad de cigarrillos que no ha fumado y el dinero ahorrado.  Consiga un libro de [place] sobre dejar de fumar y alejarse del tabaco. APRENDA NUEVAS DESTREZAS Y CONDUCTAS  Trate de entretenerse con otra cosa cuando sienta ganas de fumar. Hable con alguien, salga a caminar u ocpese en alguna tarea.  Cambie su rutina habitual. Salley Hews ruta diferente para llegar al [place]. Beba t en vez de caf. Desayune en un lugar diferente.  Reduzca las situaciones de estrs. Tome un bao caliente, practique alguna actividad fsica o lea un libro.  Planee hacer cada da algo que disfrute. Recompnsese por no fumar.  Explore programas interactivos en la web dedicados a ayudar a dejar de fumar. CONSIGA MEDICAMENTOS Y SELOS CORRECTAMENTE Algunos medicamentos pueden ayudar a dejar de fumar y Equities trader la necesidad de tabaco. Oceanographer los medicamentos con las conductas y mtodos de apoyo ya mencionados puede aumentar en gran medida sus posibilidades de dejar de fumar con xito.  La terapia de reemplazo de nicotina enva nicotina al organismo sin los efectos negativos y los riesgos del fumar. La terapia de reemplazo de nicotina incluye chicles, pastillas, inhaladores, aerosoles nasales y parches para la piel de nicotina. Algunos son de Rogelia Rohrer y otros requieren una receta mdica.  Los antidepresivos ayudan a las personas a Personal assistant de Copy, Armed forces training and education officer no se conoce cul es el mecanismo. Se venden bajo receta mdica.  Los [employer] parciales de los receptores de nicotina simulan el efecto de la nicotina en el cerebro. Se venden bajo receta mdica. Pdale al mdico que lo aconseje [employer] medicamentos que debe Risk manager y [place] modo de utilizarlos en funcin de su historia clnica. El mdico le dir qu efectos secundarios deber Best boy en cuenta si decide utilizar un medicamento o seguir  un tratamiento. Lea cuidadosamente la informacin en el envase. No use otro producto que contenga nicotina mientras use uno de reemplazo.  [place] parte de las recadas se producen dentro de los 3 primeros meses de abandonar el hbito. No se desanime si comienza a fumar de nuevo. Recuerde, la [employer] tratan varias veces de dejar de fumar antes de lograrlo. Podr sufrir sndrome de abstinencia porque su cuerpo est acostumbrado a la nicotina. Podr sentir el deseo compulsivo de fumar, irritabilidad, enojo, tos, cefaleas y dificultad para concentrarse. Estos sntomas son transitorios. Son ms intensos en un comienzo, pero desaparecern en 10 a 14 das. Para reducir las probabilidades de fracaso:  Evite el consumo alcohol. El beber disminuye sus posibilidades de xito.  Disminuya el consumo de cafena. Una vez que deje de fumar, la cantidad de cafena en su organismo aumenta y puede darle sntomas, como frecuencia cardaca rpida, sudoracin y ansiedad.  Evite a las [employer] fuman porque pueden hacer que usted desee [place].  No deje que el aumento de peso distraiga su objetivo. Muchos fumadores aumentarn de peso cuando dejen de fumar, generalmente menos de 10 libras (4,5 kg). Consuma una dieta saludable y [place]. Siempre podr  perder el peso que gan despus de dejar de fumar.  Encuentre formas de mejorar su estado de nimo que no sean fumando. PARA OBTENER MS INFORMACIN  www.smokefree.gov  Document Released: 08/14/2005 Document Revised: 12/29/2013 [facility] Patient Information 2015 [place], [place]. This information is not intended to replace advice given to you by your health care provider. Make sure you discuss any questions you have with your health care provider. Asma (Asthma) El asma es una enfermedad recurrente en la que las vas respiratorias se estrechan y [place]. Puede causar dificultad para respirar. Provoca tos,  sibilancias y sensacin de falta de aire. Los episodios de asma, tambin llamados crisis de asma, pueden ser leves o potencialmente mortales. El asma no puede curarse, pero los [employer] y los cambios en el estilo de vida lo ayudarn a Aeronautical engineer enfermedad. CAUSAS Se cree que la causa del asma son factores hereditarios (genticos) y la exposicin a factores ambientales; sin embargo, su causa exacta se desconoce. El asma generalmente es desencadenada por alrgenos, infecciones en los pulmones o sustancias irritantes que se encuentran en el aire. Los desencadenantes del asma son diferentes para cada persona. Los factores desencadenantes comunes incluyen:   Caspa de los [place].  caros del polvo.  [place].  El polen de los rboles o el csped.  Moho.  Humo.  Sustancias contaminantes como el polvo, limpiadores del hogar, sprays para el cabello, aerosoles, vapores de pintura, sustancias qumicas fuertes u olores intensos.  El [place] fro, los cambios de Scientist, forensic y el viento (que aumenta la cantidad de moho y polen en el aire).  Emociones intensas, como llorar o rer [employer].  Estrs.  Ciertos medicamentos (como la aspirina) o algunos frmacos (como los betabloqueantes).  Los sulfitos que contienen los alimentos y las bebidas. Los alimentos y bebidas que pueden contener sulfitos son las frutas desecadas, las papas fritas y los vinos espumantes.  Enfermedades infecciosas o inflamatorias, como la gripe, el resfro o la inflamacin de las membranas nasales (rinitis).  El reflujo gastroesofgico (ERGE).  Los ejercicios o actividades extenuantes. SNTOMAS Los sntomas pueden ocurrir inmediatamente despus de que se desencadena el asma o muchas horas ms tarde. [place] sntomas son:  Sibilancias.  Tos excesiva durante la noche o temprano por la maana.  Tos frecuente o intensa durante un resfro comn.  Opresin en el pecho.  Falta de aire. DIAGNSTICO  El diagnstico  se realiza mediante la revisin de su historia clnica y de un examen fsico. Es posible que le indiquen algunos estudios. Estos pueden incluir:  Estudios de la funcin pulmonar. Estas pruebas indican cunto aire inhala y exhala.  Pruebas de alergia.  Estudios de diagnstico por imgenes, como radiografas. TRATAMIENTO  El asma no puede curarse, pero puede controlarse. El Tax inspector identificar y Product/process development scientist los factores desencadenantes del asma. Tambin incluye medicamentos. Hay dos tipos de medicamentos utilizados en el tratamiento para el asma:   Medicamentos de control del asma. Impiden que aparezcan los sntomas. Generalmente se [employer].  Medicamentos de [place] o de rescate. Alivian los sntomas rpidamente. Se utilizan cuando es necesario y proporcionan alivio a Control and instrumentation engineer. El mdico lo ayudar a Animal nutritionist de accin para el asma, que es una planificacin por escrito para el control y el tratamiento de las crisis de asma. Incluye una lista de los factores desencadenantes y el modo en que pueden evitarse. Tambin incluye informacin acerca del momento en que se deben Apple Computer y cundo se debe cambiar la dosis. Un plan de  accin tambin incluye el uso de un dispositivo llamado espirmetro. El espirmetro es un dispositivo que mide el funcionamiento de los pulmones. Lo ayuda a controlar la enfermedad. Malden los medicamentos solamente como se lo haya indicado el mdico. Hable con el mdico si tiene preguntas acerca de cmo o cundo tomar los medicamentos.  Use un espirmetro de acuerdo con las indicaciones del mdico. Anote y Quarry manager un registro de los [place].  Conozca y [employer]akes de accin para ayudar a minimizar o detener una crisis de asma sin necesidad de buscar atencin mdica.  Controle el ambiente de su hogar de la siguiente manera para prevenir las crisis de asma:  No fume. Evite la  exposicin al humo de otros fumadores.  Cambie regularmente el filtro de la calefaccin y del aire acondicionado.  Limite el uso de hogares o estufas a lea.  Elimine las plagas (como cucarachas, ratones) y sus excrementos.  Elimine las plantas si observa moho en ellas.  Limpie habitualmente los pisos y el polvo. Utilice productos sin perfume.  Intente que otra persona pase la aspiradora con regularidad. Permanezca fuera de las habitaciones mientras son aspiradas y por algn tiempo despus. Si usted pasa la [place], use una mscara para polvo de las que se consiguen en la [place], una bolsa de aspiradora de doble capa o microfiltro o una aspiradora con un filtro HEPA.  Reemplace las alfombras por pisos de [place], baldosas o vinilo. Las alfombras pueden retener la caspa de los animales y [place].  Use almohadas, mantas y cubre colchones antialrgicos.  [place] sbanas y las mantas todas las semanas con agua caliente y squelas con aire caliente.  Use mantas de polister o algodn.  Limpie baos y cocinas con lavandina. Si fuera posible, pdale a alguien que vuelva a pintar las paredes de estas habitaciones con Ardelia Mems pintura resistente a los hongos. Aljese de las habitaciones que se estn limpiando y pintando.  Lvese las manos con frecuencia. SOLICITE ATENCIN MDICA SI:   Respira con dificultad an cuando toma el medicamento para prevenir las crisis.  La mucosidad coloreada que expectora cuando tose (esputo) es ms espesa que lo habitual.  Su esputo cambia de un color claro o blanco a un color amarillo, verde, gris o sanguinolento.  Tiene trastornos ocasionados por el medicamento que est tomando (como urticaria, picazn, hinchazn o dificultades respiratorias).  Necesita un medicamento aliviador ms de 2 o 3 veces por semana.  Su flujo espiratorio mximo se mantiene entre el 50% y el 79% de su Pharmacist, hospital personal, despus de seguir el plan de accin durante  1hora.  Tiene fiebre. SOLICITE ATENCIN MDICA DE INMEDIATO SI:   Usted parece empeorar y no responde al tratamiento durante una crisis de asma.  Le falta el aire, incluso en reposo.  Le falta el aire an cuando hace muy poca actividad fsica.  Tiene dificultad para comer, beber o hablar debido a los sntomas del asma.  Siente dolor en el pecho.  Se le acelera la frecuencia cardaca.  Tiene los labios o las uas de tono [place].  Siente que est por desvanecerse, est mareado o se desmaya.  Su flujo mximo es Garment/textile technologist del 50% del Pharmacist, hospital personal. ASEGRESE DE QUE:   Comprende estas instrucciones.  Controlar su afeccin.  Recibir ayuda de inmediato si no mejora o si empeora. Document Released: 08/14/2005 Document Revised: 12/29/2013 [facility] Patient Information 2015 [place], [place]. This information is not intended to  replace advice given to you by your health care provider. Make sure you discuss any questions you have with your health care provider.  

## 2014-09-24 NOTE — Progress Notes (Signed)
Patient ID: Eileen [name], female   DOB: [birth year], 44 y.o.   MRN: [id number]  CC: breast pain   HPI: Eileen [name] is a 44 y.o. female here today for a follow up visit.  Patient has past medical history of asthma.  She presents today for refills of her inhaler and for evaluation of left breast pain that she has had for the past 15 days.  When she had her monthly cycle of January she began to get nipple pain which then progressed to breast cramping. She states that after the period ended she had some resolution of pain but continues to have some discomfort and does not feel that it is normal. She has never experienced pain in her breast with her periods in the past. LMP 09/10/14. Patient is requesting a refill of her asthma inhaler. She denies nighttime symptoms or cough. She reports occasional SOB with exertion.  Patient has No headache, No chest pain, No abdominal pain - No Nausea, No new weakness tingling or numbness, No Cough.  No Known Allergies Past Medical History  Diagnosis Date  . Right inguinal hernia   . Arthritis   . CHF (congestive heart failure)     "unsure thinks lungs had fluid around after surgery 7 years ago"  . Chronic kidney disease     kidney infection  . Shortness of breath   . Asthma   . Neuromuscular disorder     HERNIA   Current Outpatient Prescriptions on File Prior to Visit  Medication Sig Dispense Refill  . albuterol (PROVENTIL HFA;VENTOLIN HFA) 108 (90 BASE) MCG/ACT inhaler Inhale 2 puffs into the lungs every 4 (four) hours as needed for wheezing or shortness of breath. 1 Inhaler 0  . Acetaminophen (TYLENOL PO) Take 2 tablets by mouth once.     . polyethylene glycol (MIRALAX) packet Take 17 g by mouth daily. (Patient not taking: Reported on 09/24/2014) 30 each 3   No current facility-administered medications on file prior to visit.   Family History  Problem Relation Age of Onset  . Ulcers Father     stomach  . Diabetes Brother   . Colon  cancer Neg Hx   . Rectal cancer Neg Hx   . Stomach cancer Neg Hx    History   Social History  . Marital Status: Legally Separated    Spouse Name: N/A    Number of Children: 4  . Years of Education: N/A   Occupational History  . Cleaning    Social History Main Topics  . Smoking status: Current Every Day Smoker -- 0.50 packs/day for 20 years    Types: Cigarettes  . Smokeless tobacco: Never Used     Comment: 3-8 CIGS A DAY  . Alcohol Use: No  . Drug Use: No  . Sexual Activity: Not Currently    Birth Control/ Protection: None   Other Topics Concern  . Not on file   Social History Narrative    Review of Systems: See HPI   Objective:   Filed Vitals:   09/24/14 1721  BP: 111/70  Pulse: 74  Temp: 98.3 F (36.8 C)  Resp: 16    Physical Exam  Cardiovascular: Normal rate, regular rhythm and normal heart sounds.   Pulmonary/Chest: Effort normal and breath sounds normal. She has no wheezes. Right breast exhibits no inverted nipple, no mass and no nipple discharge. Left breast exhibits tenderness. Left breast exhibits no inverted nipple, no mass and no nipple discharge.     Lab Results  Component Value Date   WBC 6.9 10/29/2013   HGB 13.7 10/29/2013   HCT 37.8 10/29/2013   MCV 89.6 10/29/2013   PLT 276 10/29/2013   Lab Results  Component Value Date   CREATININE 0.61 10/29/2013   BUN 13 10/29/2013   NA 141 10/29/2013   K 4.2 10/29/2013   CL 105 10/29/2013   CO2 23 10/29/2013    No results found for: HGBA1C Lipid Panel     Component Value Date/Time   CHOL 159 06/28/2010 2048   TRIG 132 06/28/2010 2048   HDL 40 06/28/2010 2048   CHOLHDL 4.0 Ratio 06/28/2010 2048   VLDL 26 06/28/2010 2048   LDLCALC 93 06/28/2010 2048       Assessment and plan:   Eileen [name] was seen today for follow-up.  Diagnoses and all orders for this visit:  Nipple pain Orders: -     MM Digital Diagnostic Bilat; Future  Asthma, mild intermittent, uncomplicated Orders: -      albuterol (PROVENTIL HFA;VENTOLIN HFA) 108 (90 BASE) MCG/ACT inhaler; Inhale 2 puffs into the lungs every 4 (four) hours as needed for wheezing or shortness of breath.  Current smoker Smoking cessation discussed for 3 minutes, patient is not willing to quit at this time. Will continue to assess on each visit. Discussed increased risk for diseases such as cancer, heart disease, and stroke.   Need for prophylactic vaccination and inoculation against influenza Influenza injection received.  Explained side effects and contraindications to patient. Information sheet given to patient.   Due to language barrier, an interpreter was present during the history-taking and subsequent discussion (and for part of the physical exam) with this patient.  Return if symptoms worsen or fail to improve.    Chari Manning, NP-C [employer] [facility] [phone] 09/24/2014, 5:45 PM

## 2014-09-28 ENCOUNTER — Other Ambulatory Visit: Payer: Self-pay | Admitting: Internal Medicine

## 2014-09-28 DIAGNOSIS — N644 Mastodynia: Secondary | ICD-10-CM

## 2014-10-06 ENCOUNTER — Ambulatory Visit
Admission: RE | Admit: 2014-10-06 | Discharge: 2014-10-06 | Disposition: A | Discharge status: Home / Self Care | Payer: No Typology Code available for payment source | Source: Ambulatory Visit | Attending: Internal Medicine | Admitting: Internal Medicine

## 2014-10-06 DIAGNOSIS — N644 Mastodynia: Secondary | ICD-10-CM

## 2014-10-13 ENCOUNTER — Telehealth: Payer: Self-pay | Admitting: *Deleted

## 2014-10-13 NOTE — Telephone Encounter (Signed)
Pt aware of Mammogram results (Results was given in [place])

## 2014-10-13 NOTE — Telephone Encounter (Signed)
-----   Message from Lance Bosch, NP sent at 10/09/2014 10:59 PM EST ----- No evidence of malignancy on mammogram. Will repeat in one year

## 2014-10-14 ENCOUNTER — Ambulatory Visit: Payer: Self-pay

## 2014-10-22 ENCOUNTER — Ambulatory Visit (HOSPITAL_COMMUNITY): Payer: No Typology Code available for payment source

## 2015-01-26 ENCOUNTER — Ambulatory Visit: Payer: No Typology Code available for payment source | Attending: Physician Assistant

## 2015-04-27 ENCOUNTER — Encounter: Payer: Self-pay | Admitting: Internal Medicine

## 2015-04-27 ENCOUNTER — Ambulatory Visit: Payer: Self-pay | Attending: Internal Medicine | Admitting: Internal Medicine

## 2015-04-27 VITALS — BP 115/74 | HR 93 | Temp 98.0°F | Resp 16 | Ht 64.0 in | Wt 156.4 lb

## 2015-04-27 DIAGNOSIS — M79641 Pain in right hand: Secondary | ICD-10-CM | POA: Insufficient documentation

## 2015-04-27 DIAGNOSIS — N189 Chronic kidney disease, unspecified: Secondary | ICD-10-CM | POA: Insufficient documentation

## 2015-04-27 DIAGNOSIS — F329 Major depressive disorder, single episode, unspecified: Secondary | ICD-10-CM | POA: Insufficient documentation

## 2015-04-27 DIAGNOSIS — F32A Depression, unspecified: Secondary | ICD-10-CM

## 2015-04-27 DIAGNOSIS — F17210 Nicotine dependence, cigarettes, uncomplicated: Secondary | ICD-10-CM | POA: Insufficient documentation

## 2015-04-27 DIAGNOSIS — J45909 Unspecified asthma, uncomplicated: Secondary | ICD-10-CM | POA: Insufficient documentation

## 2015-04-27 DIAGNOSIS — Z79899 Other long term (current) drug therapy: Secondary | ICD-10-CM | POA: Insufficient documentation

## 2015-04-27 MED ORDER — SERTRALINE HCL 50 MG PO TABS: 50.0000 mg | ORAL_TABLET | Freq: Every day | ORAL | Status: DC

## 2015-04-27 MED ORDER — MELOXICAM 15 MG PO TABS: 15.0000 mg | ORAL_TABLET | Freq: Every day | ORAL | Status: DC

## 2015-04-27 NOTE — Progress Notes (Signed)
Patient ID: Eileen [name], female   DOB: 1971-02-26, 44 y.o.   MRN: [id number]  CC: feeling depressed, hand pain, ear  HPI: Eileen [name] is a 44 y.o. female here today for a follow up visit.  Patient has past medical history of asthma. Patient reports that she has been feeling depressed. She feels like she has no energy to get up and do things. She states that for the past 10 years since she had her last son she has been feeling like she just wants to lay around and not be bothered. She has frequent crying spells and feels worthless. She feels like she is under lots of stress and has had a difficult life. She reports that she often does not want to care for herself or her children. She denies SI. Patient does not want to elaborate on life stressors.  Right hand pain for several years. Pain is described as a deep pain. She reports that the pain is at night and very rarely during the day. Pain mostly in fingers and wrist then radiates up to her elbow. She notes tingling in her fingers. She works as a Electrical engineer.    No Known Allergies Past Medical History  Diagnosis Date  . Right inguinal hernia   . Arthritis   . CHF (congestive heart failure)     "unsure thinks lungs had fluid around after surgery 7 years ago"  . Chronic kidney disease     kidney infection  . Shortness of breath   . Asthma   . Neuromuscular disorder     HERNIA   Current Outpatient Prescriptions on File Prior to Visit  Medication Sig Dispense Refill  . Acetaminophen (TYLENOL PO) Take 2 tablets by mouth once.     Marland Kitchen albuterol (PROVENTIL HFA;VENTOLIN HFA) 108 (90 BASE) MCG/ACT inhaler Inhale 2 puffs into the lungs every 4 (four) hours as needed for wheezing or shortness of breath. (Patient not taking: Reported on 04/27/2015) 1 Inhaler 0  . polyethylene glycol (MIRALAX) packet Take 17 g by mouth daily. (Patient not taking: Reported on 09/24/2014) 30 each 3   No current facility-administered medications on file  prior to visit.   Family History  Problem Relation Age of Onset  . Ulcers Father     stomach  . Diabetes Brother   . Colon cancer Neg Hx   . Rectal cancer Neg Hx   . Stomach cancer Neg Hx    Social History   Social History  . Marital Status: Legally Separated    Spouse Name: N/A  . Number of Children: 4  . Years of Education: N/A   Occupational History  . Cleaning    Social History Main Topics  . Smoking status: Current Every Day Smoker -- 0.50 packs/day for 20 years    Types: Cigarettes  . Smokeless tobacco: Never Used     Comment: 3-8 CIGS A DAY  . Alcohol Use: No  . Drug Use: No  . Sexual Activity: Not Currently    Birth Control/ Protection: None   Other Topics Concern  . Not on file   Social History Narrative    Review of Systems: Other than what is stated in HPI, all other systems are negative.   Objective:   Filed Vitals:   04/27/15 1515  BP: 115/74  Pulse: 93  Temp: 98 F (36.7 C)  Resp: 16    Physical Exam  Constitutional: She is oriented to person, place, and time.  Cardiovascular: Normal rate, regular rhythm and  normal heart sounds.   Pulmonary/Chest: Effort normal and breath sounds normal.  Musculoskeletal:  Negative Phalen test  Neurological: She is alert and oriented to person, place, and time.     Lab Results  Component Value Date   WBC 6.9 10/29/2013   HGB 13.7 10/29/2013   HCT 37.8 10/29/2013   MCV 89.6 10/29/2013   PLT 276 10/29/2013   Lab Results  Component Value Date   CREATININE 0.61 10/29/2013   BUN 13 10/29/2013   NA 141 10/29/2013   K 4.2 10/29/2013   CL 105 10/29/2013   CO2 23 10/29/2013    No results found for: HGBA1C Lipid Panel     Component Value Date/Time   CHOL 159 06/28/2010 2048   TRIG 132 06/28/2010 2048   HDL 40 06/28/2010 2048   CHOLHDL 4.0 Ratio 06/28/2010 2048   VLDL 26 06/28/2010 2048   LDLCALC 93 06/28/2010 2048       Assessment and plan:   Eileen [name] was seen today for  follow-up.  Diagnoses and all orders for this visit:  Depression -     Begin sertraline (ZOLOFT) 50 MG tablet; Take 1 tablet (50 mg total) by mouth daily. Patient will begin zoloft and I will recheck patient in 8 weeks  PHQ-9 score of 7.  Right hand pain -     meloxicam (MOBIC) 15 MG tablet; Take 1 tablet (15 mg total) by mouth daily. Pain pattern sounds as if it is carpal tunnel. Will begin patient on NSAID for now.  Interpreter was used to communicate directly with patient for the entire encounter including providing detailed patient instructions.    Return in about 2 months (around 06/27/2015) for depression .        Lance Bosch, [facility] [phone] 04/27/2015, 3:26 PM

## 2015-04-27 NOTE — Patient Instructions (Signed)
Sndrome del tnel carpiano  (Carpal Tunnel Syndrome)  El tnel carpiano es un espacio estrecho ubicado en el lado palmar de la mueca. Est formado por los huesos de la mueca y los ligamentos. Los nervios, vasos sanguneos y tendones pasan a travs del tnel carpiano. Los movimientos de la mueca o ciertas enfermedades pueden causar hinchazn del tnel. Esta hinchazn comprime el nervio principal en la mueca ((nervio mediano) y ocasiona un trastorno doloroso que se denomina sndrome del tnel carpiano. CAUSAS   Movimientos repetidos de la mueca.  Lesiones en la mueca.  Ciertas enfermedades como la artritis, la diabetes, el alcoholismo, el hipertiroidismo o la insuficiencia renal.  Obesidad.  Embarazo. SNTOMAS   Sensacin de "pinchazos" en los dedos o la mano.  Hormigueo o entumecimiento en los dedos o en la mano.  Sensacin dolorosa en todo el brazo.  Dolor en la mueca que sube por el brazo hasta el hombro.  Dolor que baja por la mano o los dedos.  Sensacin de debilidad en las manos. DIAGNSTICO  El mdico le har una historia clnica y un examen fsico. Puede ser necesario hacer un electromiograma. Esta prueba mide las seales elctricas enviadas por los msculos. Generalmente el paso de las seales elctricas es impedido por el sndrome del tnel carpiano. Tambin puede ser necesario que le tomen radiografas.  TRATAMIENTO  El sndrome del tnel carpiano puede curarse espontneamente sin tratamiento. El mdico le indicar el uso de un cabestrillo para la mueca o que tome medicamentos como antiinflamatorios no esteroides. Las inyecciones de cortisona pueden ayudar. A veces es necesaria la ciruga para liberar el nervio comprimido.  INSTRUCCIONES PARA EL CUIDADO EN EL HOGAR   Tome todos los medicamentos segn le indic su mdico. Slo tome medicamentos de venta libre o recetados para calmar el dolor, las molestias o bajar la fiebre segn las indicaciones de su mdico.  Si  le aconsejaron usar un cabestrillo para evitar que la mueca se doble, selo como le indicaron. Es importante que use el cabestrillo durante la noche. selo mientras sienta dolor o adormecimiento en la mano, el brazo o la mueca. Esto puede durar entre 1 y 2 meses.  Haga reposar la mueca de toda actividad que le cause dolor. Si sus sntomas estn relacionados con el trabajo, deber conversar con su empleador acerca de la posibilidad de cambiar a una tarea que no requiera el uso de la mueca.  Aplique hielo en la mueca despus de los perodos prolongados de actividad.  Ponga el hielo en una bolsa plstica.  Colquese una toalla entre la piel y la bolsa de hielo.  Deje el hielo en el lugar durante 15 a 20 minutos, 3 a 4 veces por da.  Cumpla con todas las visitas de control, segn le indique su mdico. Aqu se incluyen derivaciones a un ortopedista, fisioterapia y rehabilitacin. Toda demora en obtener la asistencia necesaria puede dar como resultado una demora o fracaso en la curacin. SOLICITE ATENCIN MDICA DE INMEDIATO SI:   Desarrolla nuevos e inexplicables sntomas.  Los sntomas actuales empeoran y la medicacin no los controla. ASEGRESE DE QUE:   Comprende estas instrucciones.  Controlar su enfermedad.  Solicitar ayuda de inmediato si no mejora o si empeora. Document Released: 08/14/2005 Document Revised: 05/08/2012 ExitCare Patient Information 2015 [employer]. This information is not intended to replace advice given to you by your health care provider. Make sure you discuss any questions you have with your health care provider.  

## 2015-04-27 NOTE — Progress Notes (Signed)
Interpreter line used Verdis Frederickson ID# [id number] Patient complains of feeling depressed Complains of pain to her right hand Patient also has a "slice" in her right ear that she does not recall how hit happened The right ear has been this way for over a month

## 2015-09-27 ENCOUNTER — Ambulatory Visit: Payer: No Typology Code available for payment source | Attending: Internal Medicine | Admitting: Internal Medicine

## 2015-09-27 ENCOUNTER — Encounter: Payer: Self-pay | Admitting: Internal Medicine

## 2015-09-27 ENCOUNTER — Other Ambulatory Visit (HOSPITAL_COMMUNITY)
Admission: RE | Admit: 2015-09-27 | Discharge: 2015-09-27 | Disposition: A | Discharge status: Home / Self Care | Payer: No Typology Code available for payment source | Source: Ambulatory Visit | Attending: Internal Medicine | Admitting: Internal Medicine

## 2015-09-27 VITALS — BP 115/74 | HR 78 | Temp 98.3°F | Resp 16 | Ht 64.0 in | Wt 162.6 lb

## 2015-09-27 DIAGNOSIS — N760 Acute vaginitis: Secondary | ICD-10-CM | POA: Insufficient documentation

## 2015-09-27 DIAGNOSIS — J45909 Unspecified asthma, uncomplicated: Secondary | ICD-10-CM | POA: Insufficient documentation

## 2015-09-27 DIAGNOSIS — Z113 Encounter for screening for infections with a predominantly sexual mode of transmission: Secondary | ICD-10-CM | POA: Insufficient documentation

## 2015-09-27 DIAGNOSIS — J4520 Mild intermittent asthma, uncomplicated: Secondary | ICD-10-CM | POA: Insufficient documentation

## 2015-09-27 DIAGNOSIS — Z1272 Encounter for screening for malignant neoplasm of vagina: Secondary | ICD-10-CM | POA: Insufficient documentation

## 2015-09-27 DIAGNOSIS — F32A Depression, unspecified: Secondary | ICD-10-CM

## 2015-09-27 DIAGNOSIS — N189 Chronic kidney disease, unspecified: Secondary | ICD-10-CM | POA: Insufficient documentation

## 2015-09-27 DIAGNOSIS — F17210 Nicotine dependence, cigarettes, uncomplicated: Secondary | ICD-10-CM | POA: Insufficient documentation

## 2015-09-27 DIAGNOSIS — Z01419 Encounter for gynecological examination (general) (routine) without abnormal findings: Secondary | ICD-10-CM | POA: Insufficient documentation

## 2015-09-27 DIAGNOSIS — F329 Major depressive disorder, single episode, unspecified: Secondary | ICD-10-CM | POA: Insufficient documentation

## 2015-09-27 DIAGNOSIS — I509 Heart failure, unspecified: Secondary | ICD-10-CM | POA: Insufficient documentation

## 2015-09-27 DIAGNOSIS — Z0000 Encounter for general adult medical examination without abnormal findings: Secondary | ICD-10-CM

## 2015-09-27 DIAGNOSIS — Z124 Encounter for screening for malignant neoplasm of cervix: Secondary | ICD-10-CM

## 2015-09-27 DIAGNOSIS — M1990 Unspecified osteoarthritis, unspecified site: Secondary | ICD-10-CM | POA: Insufficient documentation

## 2015-09-27 LAB — HIV ANTIBODY (ROUTINE TESTING W REFLEX): HIV: NONREACTIVE

## 2015-09-27 LAB — BASIC METABOLIC PANEL
BUN: 12 mg/dL (ref 7–25)
CALCIUM: 9.3 mg/dL (ref 8.6–10.2)
CO2: 26 mmol/L (ref 20–31)
Chloride: 106 mmol/L (ref 98–110)
Creat: 0.74 mg/dL (ref 0.50–1.10)
Glucose, Bld: 66 mg/dL (ref 65–99)
POTASSIUM: 4.3 mmol/L (ref 3.5–5.3)
SODIUM: 138 mmol/L (ref 135–146)

## 2015-09-27 LAB — CBC
HCT: 41.6 % (ref 36.0–46.0)
Hemoglobin: 14.3 g/dL (ref 12.0–15.0)
MCH: 31.5 pg (ref 26.0–34.0)
MCHC: 34.4 g/dL (ref 30.0–36.0)
MCV: 91.6 fL (ref 78.0–100.0)
MPV: 9.4 fL (ref 8.6–12.4)
PLATELETS: 313 10*3/uL (ref 150–400)
RBC: 4.54 MIL/uL (ref 3.87–5.11)
RDW: 13.5 % (ref 11.5–15.5)
WBC: 8.3 10*3/uL (ref 4.0–10.5)

## 2015-09-27 MED ORDER — ALBUTEROL SULFATE HFA 108 (90 BASE) MCG/ACT IN AERS: 2.0000 | INHALATION_SPRAY | RESPIRATORY_TRACT | Status: DC | PRN

## 2015-09-27 MED ORDER — SERTRALINE HCL 50 MG PO TABS: 50.0000 mg | ORAL_TABLET | Freq: Every day | ORAL | Status: DC

## 2015-09-27 MED FILL — VENTOLIN HFA 90 MCG INHALER: 108 (90 BAS | 25 days supply | Qty: 18 | Fill #0

## 2015-09-27 MED FILL — SERTRALINE HCL 50 MG TABLET: 50 | 30 days supply | Qty: 30 | Fill #0

## 2015-09-27 MED FILL — MELOXICAM 15 MG TABLET: 15 | 30 days supply | Qty: 30 | Fill #1

## 2015-09-27 NOTE — Patient Instructions (Signed)
Please call Sabrina Holland, [phone],  with the BCCCP (breast and cervical cancer control program) at the [facility] to set up an appointment to verify eligibility for a breast exam, mammogram, ultrasound. If you qualify this will be set up at [facility].   

## 2015-09-27 NOTE — Progress Notes (Signed)
Interpreter line used Seth Bake ID# [id number] Patient is here for her pap only

## 2015-09-28 ENCOUNTER — Telehealth: Payer: Self-pay

## 2015-09-28 ENCOUNTER — Telehealth: Payer: Self-pay | Admitting: Internal Medicine

## 2015-09-28 LAB — CERVICOVAGINAL ANCILLARY ONLY
CHLAMYDIA, DNA PROBE: NEGATIVE
NEISSERIA GONORRHEA: NEGATIVE
Trichomonas: NEGATIVE
Wet Prep (BD Affirm): POSITIVE — AB

## 2015-09-28 LAB — CYTOLOGY - PAP

## 2015-09-28 LAB — VITAMIN D 25 HYDROXY (VIT D DEFICIENCY, FRACTURES): Vit D, 25-Hydroxy: 8 ng/mL — ABNORMAL LOW (ref 30–100)

## 2015-09-28 LAB — RPR

## 2015-09-28 NOTE — Telephone Encounter (Signed)
-----   Message from Lance Bosch, NP sent at 09/28/2015  8:24 AM EST ----- Vitamin D is low. Please send drisdol 50,000 IU to take once weekly for 12 weeks. 12 tablets no refills. All other labs are normal

## 2015-09-28 NOTE — Telephone Encounter (Signed)
Pt. Returned call. Please f/u with pt. °

## 2015-09-28 NOTE — Telephone Encounter (Signed)
Interpreter line used Eileen [name] ID# [id number] patient this am  Patient not available Message left on voice mail to return our call

## 2015-09-28 NOTE — Progress Notes (Signed)
Patient ID: Eileen [name], female   DOB: 1970/09/09, 45 y.o.   MRN: [id number]   CC: physical  HPI: Eileen [name] is a 45 y.o. female here today for a physical with pap.  Patient has past medical history of arthritis and asthma. She is not any current medications. Patient reports that last mammogram and pap were normal. She denies any complaints today and is requesting medication refills. She denies tobacco, drug, and alcohol use.   No Known Allergies Past Medical History  Diagnosis Date  . Right inguinal hernia   . Arthritis   . CHF (congestive heart failure) ([place])     "unsure thinks lungs had fluid around after surgery 7 years ago"  . Chronic kidney disease     kidney infection  . Shortness of breath   . Asthma   . Neuromuscular disorder ([place])     HERNIA   Current Outpatient Prescriptions on File Prior to Visit  Medication Sig Dispense Refill  . Acetaminophen (TYLENOL PO) Take 2 tablets by mouth once.     . meloxicam (MOBIC) 15 MG tablet Take 1 tablet (15 mg total) by mouth daily. 30 tablet 1  . polyethylene glycol (MIRALAX) packet Take 17 g by mouth daily. (Patient not taking: Reported on 09/24/2014) 30 each 3   No current facility-administered medications on file prior to visit.   Family History  Problem Relation Age of Onset  . Ulcers Father     stomach  . Diabetes Brother   . Colon cancer Neg Hx   . Rectal cancer Neg Hx   . Stomach cancer Neg Hx    Social History   Social History  . Marital Status: Legally Separated    Spouse Name: N/A  . Number of Children: 4  . Years of Education: N/A   Occupational History  . Cleaning    Social History Main Topics  . Smoking status: Current Every Day Smoker -- 0.50 packs/day for 20 years    Types: Cigarettes  . Smokeless tobacco: Never Used     Comment: 3-8 CIGS A DAY  . Alcohol Use: No  . Drug Use: No  . Sexual Activity: Not Currently    Birth Control/ Protection: None   Other Topics Concern  . Not on  file   Social History Narrative    Review of Systems: Constitutional: Negative for fever, chills, diaphoresis, activity change, appetite change and fatigue. HENT: Negative for ear pain, nosebleeds, congestion, facial swelling, rhinorrhea, neck pain, neck stiffness and ear discharge.  Eyes: Negative for pain, discharge, redness, itching and visual disturbance. Respiratory: Negative for cough, choking, chest tightness, shortness of breath, wheezing and stridor.  Cardiovascular: Negative for chest pain, palpitations and leg swelling. Gastrointestinal: Negative for abdominal distention. Genitourinary: Negative for dysuria, urgency, frequency, hematuria, flank pain, decreased urine volume, difficulty urinating and dyspareunia.  Musculoskeletal: Negative for back pain, joint swelling, arthralgias and gait problem. Neurological: Negative for dizziness, tremors, seizures, syncope, facial asymmetry, speech difficulty, weakness, light-headedness, numbness and headaches.  Hematological: Negative for adenopathy. Does not bruise/bleed easily. Psychiatric/Behavioral: Negative for hallucinations, behavioral problems, confusion, dysphoric mood, decreased concentration and agitation.    Objective:   Filed Vitals:   09/27/15 1517  BP: 115/74  Pulse: 78  Temp: 98.3 F (36.8 C)  Resp: 16    Physical Exam: Constitutional: Patient appears well-developed and well-nourished. No distress. HENT: Normocephalic, atraumatic, External right and left ear normal. Oropharynx is clear and moist.  Eyes: Conjunctivae and EOM are normal. PERRLA, no scleral icterus.  Neck: Normal ROM. Neck supple. No JVD. No tracheal deviation. No thyromegaly. CVS: RRR, S1/S2 +, no murmurs, no gallops, no carotid bruit.  Pulmonary: Effort and breath sounds normal, no stridor, rhonchi, wheezes, rales.  Abdominal: Soft. BS +,  no distension, tenderness, rebound or guarding.  Musculoskeletal: Normal range of motion. No edema and no  tenderness.  Lymphadenopathy: No lymphadenopathy noted, cervical, inguinal or axillary Neuro: Alert. Normal reflexes, muscle tone coordination. No cranial nerve deficit. Skin: Skin is warm and dry. No rash noted. Not diaphoretic. No erythema. No pallor. Psychiatric: Normal mood and affect. Behavior, judgment, thought content normal. Genitalia: Normal female without lesion, discharge or tenderness, NSSA, NT, no adnexal masses felt on exam  Breast: No tenderness, masses, or nipple abnormality   Lab Results  Component Value Date   WBC 8.3 09/27/2015   HGB 14.3 09/27/2015   HCT 41.6 09/27/2015   MCV 91.6 09/27/2015   PLT 313 09/27/2015   Lab Results  Component Value Date   CREATININE 0.74 09/27/2015   BUN 12 09/27/2015   NA 138 09/27/2015   K 4.3 09/27/2015   CL 106 09/27/2015   CO2 26 09/27/2015    No results found for: HGBA1C Lipid Panel     Component Value Date/Time   CHOL 159 06/28/2010 2048   TRIG 132 06/28/2010 2048   HDL 40 06/28/2010 2048   CHOLHDL 4.0 Ratio 06/28/2010 2048   VLDL 26 06/28/2010 2048   LDLCALC 93 06/28/2010 2048       Assessment and plan:   Eileen [name] was seen today for pap only.  Diagnoses and all orders for this visit:  Annual physical exam -     Basic Metabolic Panel -     CBC -     Vitamin D, 25-hydroxy  Papanicolaou smear -     Cytology - PAP [place] -     HIV antibody (with reflex) -     RPR  Depression -     Refilled sertraline (ZOLOFT) 50 MG tablet; Take 1 tablet (50 mg total) by mouth daily. Stable, meds refilled   Asthma, mild intermittent, uncomplicated -     albuterol (PROVENTIL HFA;VENTOLIN HFA) 108 (90 Base) MCG/ACT inhaler; Inhale 2 puffs into the lungs every 4 (four) hours as needed for wheezing or shortness of breath. Stable, meds refilled  Due to language barrier, an interpreter was present during the history-taking and subsequent discussion (and for part of the physical exam) with this patient.  Return if symptoms  worsen or fail to improve.       Lance Bosch, [facility] [phone] 09/28/2015, 12:10 PM

## 2015-10-01 ENCOUNTER — Telehealth: Payer: Self-pay

## 2015-10-01 MED ORDER — METRONIDAZOLE 500 MG PO TABS: 500.0000 mg | ORAL_TABLET | Freq: Two times a day (BID) | ORAL | Status: DC

## 2015-10-01 MED FILL — metroNIDAZOLE 500 MG TABS: 500 | 7 days supply | Qty: 14 | Fill #0

## 2015-10-01 NOTE — Telephone Encounter (Signed)
Interpreter line used Eileen [name] ID# [id number] Patient is aware of her pap results and [facility]

## 2015-10-01 NOTE — Telephone Encounter (Signed)
-----   Message from Lance Bosch, NP sent at 10/01/2015  1:10 PM EST ----- Patient positive for Bacterial Vaginosis . Please explain this is not a STD, but a imbalance of the vaginal pH. Please send Flagyl 500 mg BID for 7 days. No refills, no alcohol while on this medication. Cytology normal, repeat Pap in 3 years

## 2015-10-04 ENCOUNTER — Other Ambulatory Visit: Payer: Self-pay | Admitting: Internal Medicine

## 2015-10-04 DIAGNOSIS — J4520 Mild intermittent asthma, uncomplicated: Secondary | ICD-10-CM

## 2015-10-04 MED ORDER — ALBUTEROL SULFATE HFA 108 (90 BASE) MCG/ACT IN AERS: 2.0000 | INHALATION_SPRAY | RESPIRATORY_TRACT | Status: DC | PRN

## 2015-10-05 ENCOUNTER — Other Ambulatory Visit (HOSPITAL_COMMUNITY): Payer: Self-pay | Admitting: *Deleted

## 2015-10-05 DIAGNOSIS — N644 Mastodynia: Secondary | ICD-10-CM

## 2015-10-07 ENCOUNTER — Ambulatory Visit (HOSPITAL_COMMUNITY)
Admission: RE | Admit: 2015-10-07 | Discharge: 2015-10-07 | Disposition: A | Discharge status: Home / Self Care | Payer: No Typology Code available for payment source | Source: Ambulatory Visit | Attending: Obstetrics and Gynecology | Admitting: Obstetrics and Gynecology

## 2015-10-07 ENCOUNTER — Ambulatory Visit
Admission: RE | Admit: 2015-10-07 | Discharge: 2015-10-07 | Disposition: A | Discharge status: Home / Self Care | Payer: No Typology Code available for payment source | Source: Ambulatory Visit | Attending: Obstetrics and Gynecology | Admitting: Obstetrics and Gynecology

## 2015-10-07 ENCOUNTER — Encounter (HOSPITAL_COMMUNITY): Payer: Self-pay

## 2015-10-07 VITALS — BP 110/78 | Temp 98.7°F | Ht 63.0 in | Wt 165.0 lb

## 2015-10-07 DIAGNOSIS — N644 Mastodynia: Secondary | ICD-10-CM

## 2015-10-07 DIAGNOSIS — Z1239 Encounter for other screening for malignant neoplasm of breast: Secondary | ICD-10-CM

## 2015-10-07 NOTE — Patient Instructions (Addendum)
Educational materials on self breast awareness given. Explained to Eileen [name] that she did not need a Pap smear today due to last Pap smear was 09/27/2015. Let her know BCCCP will cover Pap smears every 3 years unless has a history of abnormal Pap smears. Referred patient to the [facility] for diagnostic mammogram. Appointment scheduled for Thursday, October 07, 2015 at 1520. Patient aware of appointment and will be there. Smoking Cessation discussed with patient. Referred patient to the [facility] Quitline and gave resources to the free smoking cessation classes offered at the [facility]. Eileen [name] verbalized understanding.  Becki Mccaskill, [place], RN 3:06 PM

## 2015-10-07 NOTE — Progress Notes (Addendum)
Complaints of bilateral breast pain x 6 months that comes and goes. Patient stated the pain is worse after her menstrual cycle. Patient rated pain at a 4-5 out of 10.  Pap Smear: Pap smear not completed today. Last Pap smear was 09/27/2015 at [facility] and normal. Per patient has no history of an abnormal Pap smear. Last two Pap smear results are in EPIC.  Physical exam: Breasts Breasts symmetrical. No skin abnormalities bilateral breasts. No nipple retraction bilateral breasts. No nipple discharge bilateral breasts. No lymphadenopathy. No lumps palpated bilateral breasts. Complaints of bilateral outer breast pain on exam. Referred patient to the [facility] for diagnostic mammogram. Appointment scheduled for Thursday, October 07, 2015 at 1520.  Pelvic/Bimanual No Pap smear completed today since last Pap smear was 09/27/2015. Pap smear not indicated per BCCCP guidelines.   Smoking History: Smoking Cessation discussed with patient. Referred patient to the [facility] Quitline and gave resources to the free smoking cessation classes offered at the [facility].  Patient Navigation: Patient education provided. Access to services provided for patient through [facility] program. Spanish interpreter provided.   Used Spanish interpreter Juliann Mule.

## 2015-10-12 ENCOUNTER — Encounter (HOSPITAL_COMMUNITY): Payer: Self-pay | Admitting: *Deleted

## 2015-11-18 ENCOUNTER — Ambulatory Visit: Payer: Self-pay

## 2015-11-18 ENCOUNTER — Other Ambulatory Visit: Payer: Self-pay

## 2015-11-18 VITALS — BP 118/66 | HR 62 | Temp 97.8°F | Resp 18 | Ht 61.0 in | Wt 162.7 lb

## 2015-11-18 DIAGNOSIS — Z0000 Encounter for general adult medical examination without abnormal findings: Secondary | ICD-10-CM

## 2015-11-18 NOTE — Patient Instructions (Addendum)
Discussed health assessment with patient. Will decide on programs she wants when call lab results. Patient verbalized understanding.

## 2015-11-18 NOTE — Progress Notes (Signed)
Patient is a new patient to the [facility] program and is currently a BCCCP patient effective 10/07/2015 with interpreter Doretha Imus.   Clinical Measurements: Patient is 5 ft. 1 inch, weight 162.7 lbs, and BMI 30.8.  Medical History: Patient has no history of high cholesterol or diabetes . Patient does not have a history of hypertension. Per patient no diagnosed history of coronary heart disease, heart attack, heart failure, stroke/TIA, vascular disease or congenital heart defects.   Blood Pressure, Self-measurement: Patient states that does not have reason to check Blood pressure.  Nutrition Assessment: Patient stated that does not usually eat fruit every day. Patient states she eats at least one serving of vegetables a day. Per patient states does eat 3 or more ounces of whole grains daily. Patient stated does not eat two or more servings of fish weekly. Patient states she does drink more than 36 ounces or 450 calories of beverages with added sugars weekly. Patient states she drink drinks water. Patient stated she does not watch her salt intake. Marland Kitchen  Physical Activity Assessment: Patient stated that does around 600 minutes of moderate exercise a week. Per patient rarely does vigorous exercise.  Smoking Status: Patient states that smokes around 8 cigarettes a day. Patient stated that state has never sent her any assistance. Discussed quitting smoking. Patient states that wants to but is not ready. Per patient is not exposed to smoke.   Quality of Life Assessment: In assessing patient's quality of life she stated that out of the past 30 days that she has felt her physical health was good for all days. Patient also stated that in the past 30 days that her mental health was good including stress, depression and problems with emotions for all days. Patient did state that out of the past 30 days she felt her physical or mental health had not kept her from doing her usual activities including  self-care, work or recreation.   Plan: Lab work will be done today including a lipid panel, blood glucose, and Hgb A1C. Will call lab results when they are finished. Will discuss risk reduction health coaching when call results.

## 2015-11-19 ENCOUNTER — Telehealth: Payer: Self-pay

## 2015-11-19 LAB — HEMOGLOBIN A1C
ESTIMATED AVERAGE GLUCOSE: 111 mg/dL
HEMOGLOBIN A1C: 5.5 % (ref 4.8–5.6)

## 2015-11-19 LAB — LIPID PANEL
CHOL/HDL RATIO: 3.5 ratio (ref 0.0–4.4)
Cholesterol, Total: 170 mg/dL (ref 100–199)
HDL: 48 mg/dL (ref >39)
LDL Calculated: 105 mg/dL — ABNORMAL HIGH (ref 0–99)
Triglycerides: 85 mg/dL (ref 0–149)
VLDL Cholesterol Cal: 17 mg/dL (ref 5–40)

## 2015-11-19 NOTE — Telephone Encounter (Signed)
Called to inform about lab work from 11/18/15. Interpreter, Anastasio Auerbach informed patient: cholesterol- 170, HDL- 48, LDL- 105, triglycerides - 85, BMI 30.8 (Obese) and HBG-A1C - 5.5.   HEALTH COACHING: Did risk reduction health coaching concerning serving sizes, portions, increase fiber in diet, how to increase fiber and increase exercise. Patient stated that wanted to work on everything herself.  PLAN: Will call back for further coaching in one month.

## 2016-01-13 MED FILL — AMOXICILLIN 500 MG CAPSULE: 500 | 8 days supply | Qty: 24 | Fill #0

## 2016-02-22 ENCOUNTER — Telehealth: Payer: Self-pay

## 2016-02-22 NOTE — Telephone Encounter (Addendum)
[place] Patient called per phone today for Health Coaching by interpreter Anastasio Auerbach. Patient's areas of concern were weight, lDL, smoking, weight and exercise.   HEALTH COACHING:  Patient stated that has been eating food with higher fiber. When explored how was doing better with increasing fiber in her diet. Patient stated was checking labels and eating fresh vegetables and fruits. Patient stated is  Starting to cough bad, SOB and really wants to quit smoking. Stated that really wants to quit smoking. Discussed medications, brand switch and making that firm decision. Talked about the habit part and things that can do to help. When asked about whether she had made any changes in her activity level, patient stated that was walking 30 minutes four days a week. Discussed with patient not to forget about adding in some balance and flexibility exercises.  PLAN: Will Call in 2 to 4 weeks. Will continue with increasing exercise. Will continue with the good meal and nutrition changes. Will Quit smoking. Will be doing follow up assessment when call next.

## 2016-04-24 ENCOUNTER — Encounter (HOSPITAL_COMMUNITY): Payer: Self-pay | Admitting: *Deleted

## 2016-04-24 DIAGNOSIS — R109 Unspecified abdominal pain: Secondary | ICD-10-CM | POA: Insufficient documentation

## 2016-04-24 DIAGNOSIS — M545 Low back pain: Secondary | ICD-10-CM | POA: Insufficient documentation

## 2016-04-24 DIAGNOSIS — F17200 Nicotine dependence, unspecified, uncomplicated: Secondary | ICD-10-CM | POA: Insufficient documentation

## 2016-04-24 LAB — COMPREHENSIVE METABOLIC PANEL
ALT: 13 U/L — ABNORMAL LOW (ref 14–54)
AST: 14 U/L — ABNORMAL LOW (ref 15–41)
Albumin: 3.8 g/dL (ref 3.5–5.0)
Alkaline Phosphatase: 68 U/L (ref 38–126)
Anion gap: 8 (ref 5–15)
BILIRUBIN TOTAL: 0.5 mg/dL (ref 0.3–1.2)
BUN: 11 mg/dL (ref 6–20)
CHLORIDE: 106 mmol/L (ref 101–111)
CO2: 24 mmol/L (ref 22–32)
Calcium: 9.4 mg/dL (ref 8.9–10.3)
Creatinine, Ser: 0.99 mg/dL (ref 0.44–1.00)
Glucose, Bld: 87 mg/dL (ref 65–99)
POTASSIUM: 3.7 mmol/L (ref 3.5–5.1)
Sodium: 138 mmol/L (ref 135–145)
TOTAL PROTEIN: 6.4 g/dL — AB (ref 6.5–8.1)

## 2016-04-24 LAB — URINALYSIS, ROUTINE W REFLEX MICROSCOPIC
GLUCOSE, UA: NEGATIVE mg/dL
KETONES UR: NEGATIVE mg/dL
LEUKOCYTES UA: NEGATIVE
NITRITE: NEGATIVE
PH: 6 (ref 5.0–8.0)
PROTEIN: NEGATIVE mg/dL
Specific Gravity, Urine: 1.031 — ABNORMAL HIGH (ref 1.005–1.030)

## 2016-04-24 LAB — URINE MICROSCOPIC-ADD ON

## 2016-04-24 LAB — CBC
HEMATOCRIT: 39.9 % (ref 36.0–46.0)
Hemoglobin: 13.7 g/dL (ref 12.0–15.0)
MCH: 31.6 pg (ref 26.0–34.0)
MCHC: 34.3 g/dL (ref 30.0–36.0)
MCV: 91.9 fL (ref 78.0–100.0)
PLATELETS: 278 10*3/uL (ref 150–400)
RBC: 4.34 MIL/uL (ref 3.87–5.11)
RDW: 13.5 % (ref 11.5–15.5)
WBC: 9.3 10*3/uL (ref 4.0–10.5)

## 2016-04-24 LAB — LIPASE, BLOOD: LIPASE: 31 U/L (ref 11–51)

## 2016-04-24 NOTE — ED Triage Notes (Signed)
Pt reports lower back and abdominal pain for a week. Pain is associated with some nausea and chills. Pt denies any urinary symptoms, last BM a hour ago and normal.

## 2016-04-25 ENCOUNTER — Emergency Department (HOSPITAL_COMMUNITY)
Admission: EM | Admit: 2016-04-25 | Discharge: 2016-04-25 | Disposition: A | Discharge status: Home / Self Care | Payer: Self-pay | Attending: Emergency Medicine | Admitting: Emergency Medicine

## 2016-04-25 ENCOUNTER — Emergency Department (HOSPITAL_COMMUNITY): Payer: Self-pay

## 2016-04-25 DIAGNOSIS — R109 Unspecified abdominal pain: Secondary | ICD-10-CM

## 2016-04-25 DIAGNOSIS — M545 Low back pain, unspecified: Secondary | ICD-10-CM

## 2016-04-25 LAB — I-STAT BETA HCG BLOOD, ED (MC, WL, AP ONLY)

## 2016-04-25 MED ORDER — OXYCODONE-ACETAMINOPHEN 5-325 MG PO TABS
1.0000 | ORAL_TABLET | Freq: Once | ORAL | Status: AC
Start: 2016-04-25 — End: 2016-04-25
  Administered 2016-04-25: 1 via ORAL
  Filled 2016-04-25: qty 1

## 2016-04-25 MED ORDER — TRAMADOL HCL 50 MG PO TABS: 50.0000 mg | ORAL_TABLET | Freq: Four times a day (QID) | ORAL | 0 refills | Status: DC | PRN

## 2016-04-25 MED ORDER — MORPHINE SULFATE (PF) 4 MG/ML IV SOLN
4.0000 mg | Freq: Once | INTRAVENOUS | Status: AC
  Administered 2016-04-25: 4 mg via INTRAVENOUS
  Filled 2016-04-25: qty 1

## 2016-04-25 MED ORDER — ONDANSETRON HCL 4 MG/2ML IJ SOLN
4.0000 mg | Freq: Once | INTRAMUSCULAR | Status: AC
  Administered 2016-04-25: 4 mg via INTRAVENOUS
  Filled 2016-04-25: qty 2

## 2016-04-25 MED ORDER — CYCLOBENZAPRINE HCL 10 MG PO TABS: 10.0000 mg | ORAL_TABLET | Freq: Two times a day (BID) | ORAL | 0 refills | Status: DC | PRN

## 2016-04-25 MED ORDER — SODIUM CHLORIDE 0.9 % IV BOLUS (SEPSIS)
1000.0000 mL | Freq: Once | INTRAVENOUS | Status: AC
  Administered 2016-04-25: 1000 mL via INTRAVENOUS

## 2016-04-25 MED ORDER — IOPAMIDOL (ISOVUE-300) INJECTION 61%
INTRAVENOUS | Status: AC
  Administered 2016-04-25: 100 mL
  Filled 2016-04-25: qty 100

## 2016-04-25 MED ORDER — IBUPROFEN 600 MG PO TABS: 600.0000 mg | ORAL_TABLET | Freq: Four times a day (QID) | ORAL | 0 refills | Status: DC | PRN

## 2016-04-25 NOTE — ED Notes (Signed)
Patient transported to CT 

## 2016-04-25 NOTE — ED Notes (Signed)
Pt departed in NAD.  

## 2016-04-25 NOTE — ED Notes (Signed)
Patient transported to CT via stretcher.

## 2016-04-25 NOTE — Discharge Instructions (Signed)
Ibuprofen for pain. Tramadol for severe pain. Flexeril for spasms. Follow up with primary care doctor. Your lab work, CT scan shows no emergent findings. Your CT scan shows diverticulosis, follow up with your doctor for further monitoring.

## 2016-04-25 NOTE — ED Provider Notes (Signed)
[facility] DEPT Provider Note   CSN: [id number] Arrival date & time: 04/24/16  1950     History   Chief Complaint Chief Complaint  Patient presents with  . Abdominal Pain  . Back Pain    HPI Eileen [name] is a 45 y.o. female.  HPI Eileen [name] is a 45 y.o. female presents to emergency department complaining of lower back pain and lower abdominal pain. Patient states that her symptoms started with lower back pain approximately 2 weeks ago. She states that in the last several days pain has started to radiate into the abdomen. She reports associated nausea, no vomiting. She states movement and laying down flat makes her pain worse, nothing makes it better. She denies any urinary symptoms. She denies any vaginal discharge or bleeding. She denies any concern for STD or pregnancy. She has been taking Avapro for pain with no relief of her pain. She denies pain radiating down her legs. She denies any change in bowel movements. She reports no other complaints.  History reviewed. No pertinent past medical history.  There are no active problems to display for this patient.   Past Surgical History:  Procedure Laterality Date  . CESAREAN SECTION    . HERNIA REPAIR      OB History    No data available       Home Medications    Prior to Admission medications   Medication Sig Start Date End Date Taking? Authorizing Provider  acetaminophen (TYLENOL) 325 MG tablet Take 650 mg by mouth every 6 (six) hours as needed for mild pain.   Yes Historical Provider, MD    Family History History reviewed. No pertinent family history.  Social History Social History  Substance Use Topics  . Smoking status: Current Every Day Smoker  . Smokeless tobacco: Never Used  . Alcohol use No     Allergies   Review of patient's allergies indicates no known allergies.   Review of Systems Review of Systems  Constitutional: Negative for chills and fever.  Respiratory: Negative for cough, chest  tightness and shortness of breath.   Cardiovascular: Negative for chest pain, palpitations and leg swelling.  Gastrointestinal: Positive for abdominal pain and nausea. Negative for blood in stool, diarrhea and vomiting.  Genitourinary: Negative for dysuria, flank pain, pelvic pain, vaginal bleeding, vaginal discharge and vaginal pain.  Musculoskeletal: Positive for back pain. Negative for arthralgias, myalgias, neck pain and neck stiffness.  Skin: Negative for rash.  Neurological: Negative for dizziness, weakness and headaches.  All other systems reviewed and are negative.    Physical Exam Updated Vital Signs BP 108/72   Pulse 72   Temp 98.1 F (36.7 C)   Resp 20   Ht 5\' 4"  (1.626 m)   Wt 74.4 kg   LMP 03/27/2016   SpO2 100%   BMI 28.17 kg/m   Physical Exam  Constitutional: She is oriented to person, place, and time. She appears well-developed and well-nourished. No distress.  HENT:  Head: Normocephalic.  Eyes: Conjunctivae are normal.  Neck: Neck supple.  Cardiovascular: Normal rate, regular rhythm and normal heart sounds.   Pulmonary/Chest: Effort normal and breath sounds normal. No respiratory distress. She has no wheezes. She has no rales.  Abdominal: Soft. Bowel sounds are normal. She exhibits no distension. There is tenderness. There is no rebound.  Diffuse tenderness. Bilateral CVA tenderness.  Musculoskeletal: She exhibits no edema.  Tenderness to palpation over midline and paravertebral lumbar spine./Bilateral hips. No pain with bilateral straight leg raise.  Neurological: She is alert and oriented to person, place, and time.  5/5 and equal lower extremity strength. 2+ and equal patellar reflexes bilaterally. Pt able to dorsiflex bilateral toes and feet with good strength against resistance. Equal sensation bilaterally over thighs and lower legs.   Skin: Skin is warm and dry.  Psychiatric: She has a normal mood and affect. Her behavior is normal.  Nursing note and  vitals reviewed.    ED Treatments / Results  Labs (all labs ordered are listed, but only abnormal results are displayed) Labs Reviewed  COMPREHENSIVE METABOLIC PANEL - Abnormal; Notable for the following:       Result Value   Total Protein 6.4 (*)    AST 14 (*)    ALT 13 (*)    All other components within normal limits  URINALYSIS, ROUTINE W REFLEX MICROSCOPIC (NOT AT [facility]) - Abnormal; Notable for the following:    Color, Urine AMBER (*)    Specific Gravity, Urine 1.031 (*)    Hgb urine dipstick MODERATE (*)    Bilirubin Urine SMALL (*)    All other components within normal limits  URINE MICROSCOPIC-ADD ON - Abnormal; Notable for the following:    Squamous Epithelial / LPF 0-5 (*)    Bacteria, UA FEW (*)    All other components within normal limits  LIPASE, BLOOD  CBC  I-STAT BETA HCG BLOOD, ED ([facility], [facility], [facility] ONLY)    EKG  EKG Interpretation None       Radiology No results found.  Procedures Procedures (including critical care time)  Medications Ordered in ED Medications  morphine 4 MG/ML injection 4 mg (not administered)  ondansetron (ZOFRAN) injection 4 mg (not administered)  iopamidol (ISOVUE-300) 61 % injection (not administered)     Initial Impression / Assessment and Plan / ED Course  I have reviewed the triage vital signs and the nursing notes.  Pertinent labs & imaging results that were available during my care of the patient were reviewed by me and considered in my medical decision making (see chart for details).  Clinical Course   Pt with lower back pain radiating into bilateral lower abdomen. Pain is worse with movement and palpation of the lower back and lower abdomen. neurovacularly intact. Labs normal. UA with no signs of infection.  Will get CT abd/pelvi for further evalution.   5:44 AM CT negative. Question musculoskeletal pain. Will treat with naprosyn, tramadol, flexeril, follow up with primary care doctor.   Final Clinical Impressions(s) /  ED Diagnoses   Final diagnoses:  Bilateral low back pain without sciatica  Abdominal pain, unspecified abdominal location    New Prescriptions Discharge Medication List as of 04/25/2016  5:50 AM    START taking these medications   Details  cyclobenzaprine (FLEXERIL) 10 MG tablet Take 1 tablet (10 mg total) by mouth 2 (two) times daily as needed for muscle spasms., Starting Tue 04/25/2016, Print    ibuprofen (ADVIL,MOTRIN) 600 MG tablet Take 1 tablet (600 mg total) by mouth every 6 (six) hours as needed., Starting Tue 04/25/2016, Print    traMADol (ULTRAM) 50 MG tablet Take 1 tablet (50 mg total) by mouth every 6 (six) hours as needed., Starting Tue 04/25/2016, Print         [place], PA-C 04/25/16 [id number]    Merryl Hacker, MD 04/25/16 [id number]

## 2016-04-28 ENCOUNTER — Ambulatory Visit: Payer: Self-pay | Attending: Internal Medicine | Admitting: Physician Assistant

## 2016-04-28 VITALS — BP 111/75 | HR 79 | Temp 98.2°F | Resp 16 | Wt 164.8 lb

## 2016-04-28 DIAGNOSIS — R1084 Generalized abdominal pain: Secondary | ICD-10-CM

## 2016-04-28 DIAGNOSIS — M545 Low back pain, unspecified: Secondary | ICD-10-CM

## 2016-04-28 DIAGNOSIS — M62838 Other muscle spasm: Secondary | ICD-10-CM

## 2016-04-28 MED ORDER — METHOCARBAMOL 500 MG PO TABS: 500.0000 mg | ORAL_TABLET | Freq: Four times a day (QID) | ORAL | 0 refills | Status: DC

## 2016-04-28 NOTE — Progress Notes (Signed)
Patient ID: Eileen [name], female   DOB: [birth year], 45 y.o.   MRN: [id number]   Eileen [name], is a 45 y.o. female  [id number]  [id number]  DOB - 10-02-1970  Subjective:  Chief Complaint and HPI: Eileen [name] is a 45 y.o. female here today to establish care and for a follow up visit after being seen and evaluated in the ED for back/flank/abdominal pain on 04/25/2016.  +Some relief with meds when she takes them. (she was prescribed flexeril/ibuprofen/tramadol). Pain B lower back and radiates around to abdomen.  L is minimally worse than R.  [employer]" used. Appetite is normal.  No pain with urination.  No vaginal discharge.  She is currently on her period.  No f/c.  No N/V/D.  NKI or new activities recently.  She has had this pain now for a little over 2 weeks.  Pain is constant and moderate.  No exacerbating factors.  Meds alleviate it a little.   ED/labs/imaging notes reviewed.    ROS:   Constitutional:  No f/c, No night sweats, No unexplained weight loss. EENT:  No vision changes, No blurry vision, No hearing changes. No mouth, throat, or ear problems.  Respiratory: No cough, No SOB Cardiac: No CP, no palpitations GI:  + abd pain, No N/V/D. GU: No Urinary s/sx Musculoskeletal: +back pain Neuro: No headache, no dizziness, no motor weakness.  Skin: No rash Endocrine:  No polydipsia. No polyuria.  Psych: Denies SI/HI  No problems updated.  ALLERGIES: No Known Allergies  PAST MEDICAL HISTORY: No past medical history on file.  MEDICATIONS AT HOME: Prior to Admission medications   Medication Sig Start Date End Date Taking? Authorizing Provider  acetaminophen (TYLENOL) 325 MG tablet Take 650 mg by mouth every 6 (six) hours as needed for mild pain.   Yes Historical Provider, MD  ibuprofen (ADVIL,MOTRIN) 600 MG tablet Take 1 tablet (600 mg total) by mouth every 6 (six) hours as needed. 04/25/16  Yes Tatyana Kirichenko, PA-C  traMADol (ULTRAM) 50 MG tablet  Take 1 tablet (50 mg total) by mouth every 6 (six) hours as needed. 04/25/16  Yes Tatyana Kirichenko, PA-C  methocarbamol (ROBAXIN) 500 MG tablet Take 1 tablet (500 mg total) by mouth 4 (four) times daily. X5 days then prn muscle spasm 04/28/16   Argentina Donovan, PA-C     Objective:  EXAM:   Vitals:   04/28/16 1021  BP: 111/75  Pulse: 79  Resp: 16  Temp: 98.2 F (36.8 C)  TempSrc: Oral  SpO2: 97%  Weight: 164 lb 12.8 oz (74.8 kg)    General appearance : A&OX3. NAD. Non-toxic-appearing HEENT: Atraumatic and Normocephalic.  PERRLA. EOM intact.  TM clear B. Mouth-MMM, post pharynx WNL w/o erythema, No PND. Neck: supple, no JVD. No cervical lymphadenopathy. No thyromegaly Chest/Lungs:  Breathing-non-labored, Good air entry bilaterally, breath sounds normal without rales, rhonchi, or wheezing  CVS: S1 S2 regular, no murmurs, gallops, rubs  Abdomen: Bowel sounds present, Non tender and not distended with no gaurding, rigidity or rebound. No megaly.  Neg Murphy's and McBurneys.  No peritoneal signs.  Extremities: Bilateral Lower Ext shows no edema, both legs are warm to touch with = pulse throughout.  DTR=B U&L ext Back-full S&ROM.  There is no TTP over the spiny processes.  There is general muscle spasmm that is TTP in the thoracolumbar region.  Overall exam is benign Neurology:  CN II-XII grossly intact, Non focal.   Psych:  TP linear. J/I WNL. Normal speech. Appropriate  eye contact and affect.  Skin:  No Rash     Assessment & Plan   1. Muscle spasm - methocarbamol (ROBAXIN) 500 MG tablet; Take 1 tablet (500 mg total) by mouth 4 (four) times daily. X5 days then prn muscle spasm  Dispense: 90 tablet; Refill: 0  2. Midline low back pain without sciatica Take the Ibuprofen she was prescribed 600mg  tid X 5 days then prn pain  3. Generalized abdominal pain No acute problems on  CT Abd/pelvis 04/25/2016. Diverticulosis without diverticulitis found.   If f/c, N/V/D or pain worsens,  back here, to Urgent Care or ED.   Patient have been counseled extensively about nutrition and exercise  Return in about 3 weeks (around 05/19/2016) for establish with PCP. and reck back pain  The patient was given clear instructions to go to ER or return to medical center if symptoms don't improve, worsen or new problems develop. The patient verbalized understanding. The patient was told to call to get lab results if they haven't heard anything in the next week.     Freeman Caldron, PA-C [facility] [place], [place]   04/28/2016, 11:53 AM

## 2016-04-28 NOTE — Progress Notes (Signed)
Pt is in the office today for a ED follow up  Pt states her pain level today in the office is a 7 Pt states her pain is coming from her back Pt states her back has been hurting for 22 days  Pt states she has been taking the medication that was provided from the ED

## 2016-05-04 ENCOUNTER — Ambulatory Visit: Payer: Self-pay | Admitting: Internal Medicine

## 2016-05-04 ENCOUNTER — Ambulatory Visit: Payer: Self-pay | Admitting: Family Medicine

## 2016-05-06 ENCOUNTER — Ambulatory Visit: Payer: Self-pay | Attending: Internal Medicine | Admitting: Family Medicine

## 2016-05-06 ENCOUNTER — Encounter: Payer: Self-pay | Admitting: Family Medicine

## 2016-05-06 VITALS — BP 101/67 | HR 74 | Temp 98.2°F | Wt 164.6 lb

## 2016-05-06 DIAGNOSIS — N760 Acute vaginitis: Secondary | ICD-10-CM

## 2016-05-06 DIAGNOSIS — Z23 Encounter for immunization: Secondary | ICD-10-CM

## 2016-05-06 DIAGNOSIS — A499 Bacterial infection, unspecified: Secondary | ICD-10-CM

## 2016-05-06 DIAGNOSIS — L280 Lichen simplex chronicus: Secondary | ICD-10-CM

## 2016-05-06 DIAGNOSIS — L298 Other pruritus: Secondary | ICD-10-CM | POA: Insufficient documentation

## 2016-05-06 DIAGNOSIS — Z124 Encounter for screening for malignant neoplasm of cervix: Secondary | ICD-10-CM

## 2016-05-06 DIAGNOSIS — B9689 Other specified bacterial agents as the cause of diseases classified elsewhere: Secondary | ICD-10-CM

## 2016-05-06 MED ORDER — TRIAMCINOLONE ACETONIDE 0.1 % EX CREA: 1.0000 "application " | TOPICAL_CREAM | Freq: Two times a day (BID) | CUTANEOUS | 0 refills | Status: DC

## 2016-05-06 NOTE — Patient Instructions (Addendum)
Eileen [name] was seen today for vaginal itching.  Diagnoses and all orders for this visit:  Papanicolaou smear for cervical cancer screening -     Cytology - PAP  Lichen simplex chronicus -     triamcinolone cream (KENALOG) 0.1 %; Apply 1 application topically 2 (two) times daily. Apply to vaginal area for 2-4 weeks  you will be called with pap results  F.u in 4 weeks to discuss low back pain  Dr. Adrian Blackwater

## 2016-05-06 NOTE — Progress Notes (Signed)
Subjective:  Patient ID: Eileen [name], female    DOB: 1971-08-26  Age: 45 y.o. MRN: [id number]  CC: Vaginal Itching   HPI Eileen [name] presents for   1. Rash in vaginal area: x 2-3 months. With scab. Some itching. Spreading. Has had rash in other areas that have come and resolved.  She is due for pap smear as well.   Social History  Substance Use Topics  . Smoking status: Current Every Day Smoker  . Smokeless tobacco: Never Used  . Alcohol use No    Outpatient Medications Prior to Visit  Medication Sig Dispense Refill  . acetaminophen (TYLENOL) 325 MG tablet Take 650 mg by mouth every 6 (six) hours as needed for mild pain.    Marland Kitchen ibuprofen (ADVIL,MOTRIN) 600 MG tablet Take 1 tablet (600 mg total) by mouth every 6 (six) hours as needed. 30 tablet 0  . methocarbamol (ROBAXIN) 500 MG tablet Take 1 tablet (500 mg total) by mouth 4 (four) times daily. X5 days then prn muscle spasm 90 tablet 0  . traMADol (ULTRAM) 50 MG tablet Take 1 tablet (50 mg total) by mouth every 6 (six) hours as needed. 15 tablet 0   No facility-administered medications prior to visit.     ROS Review of Systems  Constitutional: Negative for chills and fever.  Eyes: Negative for visual disturbance.  Respiratory: Negative for shortness of breath.   Cardiovascular: Negative for chest pain.  Gastrointestinal: Negative for abdominal pain and blood in stool.  Musculoskeletal: Positive for back pain. Negative for arthralgias.  Skin: Negative for rash.  Allergic/Immunologic: Negative for immunocompromised state.  Hematological: Negative for adenopathy. Does not bruise/bleed easily.  Psychiatric/Behavioral: Negative for dysphoric mood and suicidal ideas.    Objective:  BP 101/67 (BP Location: Left Arm, Patient Position: Sitting, Cuff Size: Small)   Pulse 74   Temp 98.2 F (36.8 C) (Oral)   Wt 164 lb 9.6 oz (74.7 kg)   LMP 03/29/2016   SpO2 97%   BMI 28.25 kg/m   BP/Weight 05/06/2016 04/28/2016 123456    Systolic BP 99991111 [id number] [id number]  Diastolic BP 67 75 72  Wt. (Lbs) 164.6 164.8 -  BMI 28.25 28.29 28.17   Physical Exam  Constitutional: She appears well-developed and well-nourished. No distress.  Cardiovascular: Normal rate, regular rhythm, normal heart sounds and intact distal pulses.   Pulmonary/Chest: Effort normal and breath sounds normal.  Genitourinary: Vagina normal. Pelvic exam was performed with patient prone. There is no rash, tenderness or lesion on the right labia. There is no rash, tenderness or lesion on the left labia. Uterus is enlarged and tender. Uterus is not deviated and not fixed. Cervix exhibits no motion tenderness, no discharge and no friability. Right adnexum displays no mass, no tenderness and no fullness. Left adnexum displays no mass, no tenderness and no fullness.    Musculoskeletal: She exhibits no edema.  Lymphadenopathy:       Right: No inguinal adenopathy present.       Left: No inguinal adenopathy present.  Skin: Skin is warm and dry. No rash noted.       Assessment & Plan:  Donnajean Lopes was seen today for vaginal itching.  Diagnoses and all orders for this visit:  Papanicolaou smear for cervical cancer screening -     Cytology - PAP  Lichen simplex chronicus -     Discontinue: triamcinolone cream (KENALOG) 0.1 %; Apply 1 application topically 2 (two) times daily. Apply to vaginal area for 2-4 weeks -  triamcinolone cream (KENALOG) 0.1 %; Apply 1 application topically 2 (two) times daily. Apply to vaginal area for 2-4 weeks  Encounter for immunization -     Flu Vaccine QUAD 36+ mos IM   There are no diagnoses linked to this encounter.  No orders of the defined types were placed in this encounter.   Follow-up: No Follow-up on file.   Boykin Nearing MD

## 2016-05-07 NOTE — Assessment & Plan Note (Signed)
Chronic scaly rash on mons concerning for lichen simplex chronicus Plan for 2-4 weeks of kenalog and recheck at follow up

## 2016-05-09 ENCOUNTER — Encounter: Payer: Self-pay | Admitting: Family Medicine

## 2016-05-09 LAB — CERVICOVAGINAL ANCILLARY ONLY
Chlamydia: NEGATIVE
NEISSERIA GONORRHEA: NEGATIVE
WET PREP (BD AFFIRM): POSITIVE — AB

## 2016-05-09 LAB — CYTOLOGY - PAP

## 2016-05-09 MED ORDER — METRONIDAZOLE 500 MG PO TABS: 500.0000 mg | ORAL_TABLET | Freq: Two times a day (BID) | ORAL | 0 refills | Status: DC

## 2016-05-09 MED ORDER — FLUCONAZOLE 150 MG PO TABS: 150.0000 mg | ORAL_TABLET | Freq: Once | ORAL | 0 refills | Status: AC

## 2016-05-09 NOTE — Addendum Note (Signed)
Addended by: Boykin Nearing on: 05/09/2016 05:52 PM   Modules accepted: Orders

## 2016-05-11 ENCOUNTER — Telehealth: Payer: Self-pay

## 2016-05-11 NOTE — Telephone Encounter (Signed)
-----   Message from Boykin Nearing, MD sent at 05/11/2016  1:13 PM EDT ----- Bacterial vaginosis (BV) on wet prep This is not an STD This is an overgrowth of bacteria that can be treated with flagyl 500 mg twice daily followed by difucan 150 mg once to prevent yeast.  I have sent these to your pharmacy Do not mix flagyl with alcohol as this will cause stomach upset

## 2016-05-11 NOTE — Telephone Encounter (Signed)
Call translation provided by [facility] [employer] [place]; Message left requesting return call to [facility].

## 2016-05-11 NOTE — Telephone Encounter (Signed)
-----   Message from Boykin Nearing, MD sent at 05/09/2016  5:51 PM EDT ----- Bacterial vaginosis (BV) on wet prep This is not an STD This is an overgrowth of bacteria that can be treated with flagyl 500 mg twice daily followed by difucan 150 mg once to prevent yeast.  I have sent these to your pharmacy Do not mix flagyl with alcohol as this will cause stomach upset

## 2016-05-11 NOTE — Telephone Encounter (Signed)
Patient returned nurses Phone call

## 2016-05-12 ENCOUNTER — Telehealth: Payer: Self-pay

## 2016-05-12 ENCOUNTER — Other Ambulatory Visit: Payer: Self-pay | Admitting: Family Medicine

## 2016-05-12 DIAGNOSIS — B9689 Other specified bacterial agents as the cause of diseases classified elsewhere: Secondary | ICD-10-CM

## 2016-05-12 DIAGNOSIS — N760 Acute vaginitis: Principal | ICD-10-CM

## 2016-05-12 MED ORDER — FLUCONAZOLE 150 MG PO TABS: 150.0000 mg | ORAL_TABLET | Freq: Once | ORAL | 0 refills | Status: AC

## 2016-05-12 NOTE — Telephone Encounter (Signed)
-----   Message from Boykin Nearing, MD sent at 05/09/2016  3:15 PM EDT ----- Pap negative, HPV negative Repeat in 3 years

## 2016-05-12 NOTE — Telephone Encounter (Signed)
Call placed to [facility] and call translated by ID [phone]. Patient hipaa verif per guidelines. Rn advised patient per Dr. Adrian Blackwater: Pap negative, HPV negative. Repeat in 3 years. Bacterial vaginosis (BV) on wet prep This is not an STD This is an overgrowth of bacteria that can be treated with flagyl 500 mg twice daily followed by difucan 150 mg once to prevent yeast.  I have sent these to your pharmacy. Do not mix flagyl with alcohol as this will cause stomach upset. Patient verbalized understanding.

## 2016-05-23 ENCOUNTER — Telehealth: Payer: Self-pay | Admitting: Family Medicine

## 2016-05-23 DIAGNOSIS — M79641 Pain in right hand: Secondary | ICD-10-CM

## 2016-05-23 NOTE — Telephone Encounter (Signed)
Patient called the office to schedule follow up appt and request medication refill for traMADol (ULTRAM) 50 MG tablet. Please follow up.    Thank you

## 2016-05-30 MED ORDER — MELOXICAM 15 MG PO TABS: 15.0000 mg | ORAL_TABLET | Freq: Every day | ORAL | 1 refills | Status: DC

## 2016-05-30 NOTE — Telephone Encounter (Signed)
Patient will need OV for evaluation of back prior to refill tramadol that was prescribed in the ED  I have refill her mobic since this is not a controlled substance

## 2016-06-06 ENCOUNTER — Ambulatory Visit: Payer: Self-pay | Attending: Family Medicine | Admitting: Family Medicine

## 2016-06-06 ENCOUNTER — Encounter: Payer: Self-pay | Admitting: Family Medicine

## 2016-06-06 VITALS — BP 106/69 | HR 76 | Temp 98.6°F | Resp 16 | Wt 160.0 lb

## 2016-06-06 DIAGNOSIS — F17210 Nicotine dependence, cigarettes, uncomplicated: Secondary | ICD-10-CM | POA: Insufficient documentation

## 2016-06-06 DIAGNOSIS — N898 Other specified noninflammatory disorders of vagina: Secondary | ICD-10-CM | POA: Insufficient documentation

## 2016-06-06 DIAGNOSIS — L299 Pruritus, unspecified: Secondary | ICD-10-CM | POA: Insufficient documentation

## 2016-06-06 DIAGNOSIS — L298 Other pruritus: Secondary | ICD-10-CM

## 2016-06-06 DIAGNOSIS — M545 Low back pain, unspecified: Secondary | ICD-10-CM | POA: Insufficient documentation

## 2016-06-06 DIAGNOSIS — L280 Lichen simplex chronicus: Secondary | ICD-10-CM

## 2016-06-06 DIAGNOSIS — Z79899 Other long term (current) drug therapy: Secondary | ICD-10-CM | POA: Insufficient documentation

## 2016-06-06 MED ORDER — MELOXICAM 15 MG PO TABS: 15.0000 mg | ORAL_TABLET | Freq: Every day | ORAL | 1 refills | Status: DC | PRN

## 2016-06-06 MED ORDER — METHOCARBAMOL 500 MG PO TABS: 500.0000 mg | ORAL_TABLET | Freq: Four times a day (QID) | ORAL | 0 refills | Status: DC | PRN

## 2016-06-06 MED ORDER — FLUCONAZOLE 150 MG PO TABS: 150.0000 mg | ORAL_TABLET | Freq: Once | ORAL | 0 refills | Status: AC

## 2016-06-06 MED FILL — FLUCONAZOLE 150 MG TABLET: 150 | 1 days supply | Qty: 1 | Fill #0

## 2016-06-06 MED FILL — $VENTOLIN HFA 18G INHALER: 108 (90 BAS | 30 days supply | Qty: 18 | Fill #0

## 2016-06-06 MED FILL — METHOCARBAMOL 500 MG TABLET: 500 | 15 days supply | Qty: 60 | Fill #0

## 2016-06-06 MED FILL — MELOXICAM 15 MG TABLET: 15 | 30 days supply | Qty: 30 | Fill #0

## 2016-06-06 NOTE — Progress Notes (Signed)
F/U back pain, rash on abdominal area Pain scale #4 Tobacco user 1/2 ppday  No suicidal thoughts in the past two days Medicine refills

## 2016-06-06 NOTE — Assessment & Plan Note (Addendum)
Improved with kenalog Still with some itching following flagyl for BV Diflucan x one  Kenalog prn

## 2016-06-06 NOTE — Patient Instructions (Addendum)
Eileen [name] was seen today for back pain.  Diagnoses and all orders for this visit:  Bilateral low back pain without sciatica, unspecified chronicity -     Discontinue: meloxicam (MOBIC) 15 MG tablet; Take 1 tablet (15 mg total) by mouth daily as needed for pain. -     Discontinue: methocarbamol (ROBAXIN) 500 MG tablet; Take 1 tablet (500 mg total) by mouth every 6 (six) hours as needed for muscle spasms. -     methocarbamol (ROBAXIN) 500 MG tablet; Take 1 tablet (500 mg total) by mouth every 6 (six) hours as needed for muscle spasms. -     meloxicam (MOBIC) 15 MG tablet; Take 1 tablet (15 mg total) by mouth daily as needed for pain.  Vagina itching -     fluconazole (DIFLUCAN) 150 MG tablet; Take 1 tablet (150 mg total) by mouth once.    F/u in 6 weeks for back pain  Dr. Adrian Blackwater   Ejercicios para la espalda (Back Exercises) Si tiene dolor de espalda, haga estos ejercicios 2 o 3veces por da, o como se lo haya indicado el mdico. Cuando el dolor desaparezca, hgalos una vez por da, pero haga ms repeticiones de cada ejercicio. Si no le duele la espalda, haga estos ejercicios una vez por da o como se lo haya indicado el mdico. EJERCICIOS Rodilla al pecho Repita estos pasos 3 o 5veces seguidas con cada pierna: 1. Acustese boca arriba sobre una cama dura o sobre el suelo con las piernas extendidas. 2. Lleve una rodilla al pecho. 3. [place]. Para lograrlo tmese la rodilla o el muslo. 4. [place] de la rodilla hasta sentir una elongacin suave en la parte baja de la espalda. 5. Mantenga la elongacin durante 10 a 30segundos. 6. Suelte y extienda la pierna lentamente. Inclinacin de la pelvis Repita estos pasos 5 o 10veces seguidas: 1. Acustese boca arriba sobre una cama dura o sobre el suelo con las piernas extendidas. 2. Flexione las rodillas de manera que apunten al techo. Los pies deben estar apoyados en el suelo. 3. Contraiga los msculos de la parte  baja del vientre (abdomen) para empujar la zona lumbar contra el suelo. Este movimiento har que el cccix apunte hacia el techo, en lugar de apuntar hacia abajo en direccin a los pies o al suelo. 4. Mantenga esta posicin durante 5 a 10segundos mientras contrae suavemente los msculos y respira con normalidad. El perro y el gato Repita estos pasos hasta que la zona lumbar se curve con ms facilidad: 1. [place] manos y las rodillas sobre una superficie firme. Las manos deben estar alineadas con los hombros y las rodillas con las caderas. Puede colocarse almohadillas debajo de las rodillas. 2. Deje caer la cabeza y lleve el cccix hacia abajo de modo que apunte en direccin al suelo para que la zona lumbar se arquee como el lomo de un gato [place]. 3. Mantenga esta posicin durante 5segundos. 4. Lentamente, levante la cabeza y lleve el cccix hacia arriba de modo que apunte en direccin al techo para que la espalda se arquee (hunda) como el lomo de un perro contento. 5. Mantenga esta posicin durante 5segundos. Flexiones de brazos Repita estos pasos 5 o 10veces seguidas: 1. Acustese boca abajo en el suelo. [place] manos cerca de la cabeza, separadas aproximadamente al ancho de los hombros. 3. Con la espalda relajada y las caderas apoyadas en el suelo, extienda lentamente los brazos para levantar la mitad superior  del cuerpo y Community education officer los hombros. No use los msculos de la espalda. Para estar ms cmodo, puede cambiar la [employer]. 4. Mantenga esta posicin durante 5segundos. 5. Lentamente vuelva a la posicin horizontal. Puentes Repita estos pasos 10veces seguidas: 1. Acustese boca arriba sobre una superficie firme. 2. Flexione las rodillas de manera que apunten al techo. Los pies deben estar apoyados en el suelo. 3. Contraiga los glteos y despegue las nalgas del suelo hasta que la cintura est casi a la altura de las rodillas. Si no siente el trabajo  muscular en las nalgas y la parte posterior de los muslos, aleje los pies 1 o 2pulgadas (2,5 o 5centmetros) de las nalgas. 4. Mantenga esta posicin durante 3 a 5segundos. 5. Lentamente, vuelva a apoyar las nalgas en el suelo y relaje los glteos. Si este ejercicio le resulta muy fcil, intente realizarlo con los brazos cruzados [place]. Abdominales Repita estos pasos 5 o 10veces seguidas: 1. Acustese boca arriba sobre una cama dura o sobre el suelo con las piernas extendidas. 2. Flexione las rodillas de manera que apunten al techo. Los pies deben estar apoyados en el suelo. 3. Cruce los [employer]. 4. Baje levemente el mentn en direccin al pecho, pero no doble el cuello. 5. Contraiga los msculos del abdomen y con lentitud eleve el pecho lo suficiente como para despegar levemente los omplatos del suelo. 6. Lentamente baje el pecho y la cabeza hasta el suelo. Elevaciones de espalda Repita estos pasos 5 o 10veces seguidas: 1. Acustese boca abajo con los brazos a los costados y apoye la frente en el suelo. 2. Contraiga los msculos de las piernas y los glteos. 3. Lentamente despegue el pecho del suelo mientras mantiene las caderas apoyadas en el suelo. Mantenga la nuca alineada con la curvatura de la espalda. Mire hacia el suelo mientras hace este ejercicio. 4. Mantenga esta posicin durante 3 a 5segundos. 5. Lentamente baje el pecho y el rostro hasta el suelo. SOLICITE AYUDA SI:  El dolor de espalda se vuelve mucho ms intenso cuando hace un ejercicio.  El dolor de espalda no se [place] 2horas despus de [employer] ejercicios. Si tiene alguno de [employer], deje de [employer] ejercicios. No vuelva a hacer los ejercicios a menos que el mdico lo autorice. SOLICITE AYUDA DE INMEDIATO SI:  Siente un dolor sbito y muy intenso en la espalda. Si esto ocurre, deje de [employer]. No vuelva a hacer los ejercicios a menos que el mdico lo autorice.    Esta informacin no tiene Marine scientist el consejo del mdico. Asegrese de hacerle al mdico cualquier pregunta que tenga.   Document Released: 11/29/2010 Document Revised: 05/05/2015 Elsevier Interactive Patient Education 2016 Standard bacteriana (Bacterial Vaginosis) La vaginosis bacteriana es una infeccin de la vagina. Se produce cuando una cantidad excesiva de ciertos grmenes (bacterias) crece en la vagina. Esta infeccin aumenta el riesgo de contraer otras infecciones de transmisin sexual. El tratamiento de esta infeccin puede ayudar a reducir el riesgo de otras infecciones, como:   Clamidia.  Roderick Pee.  VIH.  Herpes. [place] los medicamentos tal como se lo indic su mdico.  Finalice la prescripcin completa, aunque comience a sentirse mejor.  Comunique a sus compaeros sexuales que sufre una infeccin. Deben consultar a su mdico para iniciar un tratamiento.  Durante el tratamiento:  Teacher, music o use preservativos de [employer].  No se  haga duchas vaginales.  No consuma alcohol a menos que el mdico lo autorice.  No amamante a menos que el mdico la autorice. SOLICITE AYUDA SI:  No mejora luego de 3 das de tratamiento.  Observa una secrecin (prdida) de color gris ms abundante que proviene de la vagina.  Siente ms dolor que antes.  Tiene fiebre. ASEGRESE DE QUE:   Comprende estas instrucciones.  Controlar su afeccin.  Recibir ayuda de inmediato si no mejora o si empeora.   Esta informacin no tiene Marine scientist el consejo del mdico. Asegrese de hacerle al mdico cualquier pregunta que tenga.   Document Released: 11/10/2008 Document Revised: 09/04/2014 Elsevier Interactive Patient Education [employer].

## 2016-06-06 NOTE — Assessment & Plan Note (Signed)
A: MSK pain, no red flags. Improving P: mobic and robaxin prn Home PT

## 2016-06-06 NOTE — Progress Notes (Signed)
Subjective:  Patient ID: Eileen [name], female    DOB: 07-01-1971  Age: 45 y.o. MRN: [id number]  CC: Back Pain   HPI [name] presents for   1. Back pain: mild low back started in early August, 2017 she went to the ED for the pain. She feels soreness in both sides of the low back that radiates to the front. Pain is worsened by trunk rotation. She has no back injury. She has no history of back pain. Pain is improved with from 10/10 to [id number].   2. Lichen simplex chronicus: improved with kenalog. Rash has resolved. There is still slight itching. She took flagyl followed by diflucan for BV.   Social History  Substance Use Topics  . Smoking status: Current Every Day Smoker    Packs/day: 0.50    Years: 20.00    Types: Cigarettes  . Smokeless tobacco: Never Used     Comment: 3-8 CIGS A DAY  . Alcohol use No    Outpatient Medications Prior to Visit  Medication Sig Dispense Refill  . acetaminophen (TYLENOL) 325 MG tablet Take 650 mg by mouth every 6 (six) hours as needed for mild pain.    Marland Kitchen albuterol (PROVENTIL HFA;VENTOLIN HFA) 108 (90 Base) MCG/ACT inhaler Inhale 2 puffs into the lungs every 4 (four) hours as needed for wheezing or shortness of breath. (Patient not taking: Reported on 06/06/2016) 54 g 3  . ibuprofen (ADVIL,MOTRIN) 600 MG tablet Take 1 tablet (600 mg total) by mouth every 6 (six) hours as needed. (Patient not taking: Reported on 06/06/2016) 30 tablet 0  . meloxicam (MOBIC) 15 MG tablet Take 1 tablet (15 mg total) by mouth daily. (Patient not taking: Reported on 06/06/2016) 30 tablet 1  . methocarbamol (ROBAXIN) 500 MG tablet Take 1 tablet (500 mg total) by mouth 4 (four) times daily. X5 days then prn muscle spasm (Patient not taking: Reported on 06/06/2016) 90 tablet 0  . polyethylene glycol (MIRALAX) packet Take 17 g by mouth daily. (Patient not taking: Reported on 06/06/2016) 30 each 3  . sertraline (ZOLOFT) 50 MG tablet Take 1 tablet (50 mg total) by  mouth daily. (Patient not taking: Reported on 06/06/2016) 30 tablet 4  . traMADol (ULTRAM) 50 MG tablet Take 1 tablet (50 mg total) by mouth every 6 (six) hours as needed. (Patient not taking: Reported on 06/06/2016) 15 tablet 0  . Acetaminophen (TYLENOL PO) Take 2 tablets by mouth once. Reported on 10/07/2015    . metroNIDAZOLE (FLAGYL) 500 MG tablet Take 1 tablet (500 mg total) by mouth 2 (two) times daily. 14 tablet 0  . triamcinolone cream (KENALOG) 0.1 % Apply 1 application topically 2 (two) times daily. Apply to vaginal area for 2-4 weeks (Patient not taking: Reported on 06/06/2016) 60 g 0   No facility-administered medications prior to visit.     ROS Review of Systems  Constitutional: Negative for chills and fever.  Eyes: Negative for visual disturbance.  Respiratory: Negative for shortness of breath.   Cardiovascular: Negative for chest pain.  Gastrointestinal: Positive for abdominal pain. Negative for blood in stool.  Genitourinary:       Vaginal itching   Musculoskeletal: Positive for back pain. Negative for arthralgias.  Skin: Negative for rash.  Allergic/Immunologic: Negative for immunocompromised state.  Hematological: Negative for adenopathy. Does not bruise/bleed easily.  Psychiatric/Behavioral: Negative for dysphoric mood and suicidal ideas.    Objective:  BP 106/69 (BP Location: Right Arm, Patient Position: Sitting, Cuff Size: Normal)   Pulse  76   Temp 98.6 F (37 C) (Oral)   Resp 16   Wt 160 lb (72.6 kg)   LMP 05/07/2016   SpO2 99%   BMI 27.46 kg/m   BP/Weight 06/06/2016 [id number]  Systolic BP [id number]  Diastolic BP 69 67 75  Wt. (Lbs) 160 164.6 164.8  BMI 27.46 28.25 28.29    Physical Exam  Constitutional: She appears well-developed and well-nourished. No distress.  Cardiovascular: Normal rate, regular rhythm, normal heart sounds and intact distal pulses.   Pulmonary/Chest: Effort normal and breath sounds normal.  Genitourinary: Uterus  normal.    Pelvic exam was performed with patient prone. There is no rash, tenderness or lesion on the right labia. There is no rash, tenderness or lesion on the left labia. Cervix exhibits no motion tenderness, no discharge and no friability. Vaginal discharge found.  Musculoskeletal: She exhibits no edema.       Back:  Back Exam: Back: Normal Curvature, no deformities or CVA tenderness  Paraspinal Tenderness: absent   LE Strength 5/5  LE Sensation: in tact  LE Reflexes 2+ and symmetric  Straight leg raise: negative   Lymphadenopathy:       Right: No inguinal adenopathy present.       Left: No inguinal adenopathy present.  Skin: Skin is warm and dry. No rash noted.     Assessment & Plan:  [name] was seen today for back pain.  Diagnoses and all orders for this visit:  Bilateral low back pain without sciatica, unspecified chronicity -     Discontinue: meloxicam (MOBIC) 15 MG tablet; Take 1 tablet (15 mg total) by mouth daily as needed for pain. -     Discontinue: methocarbamol (ROBAXIN) 500 MG tablet; Take 1 tablet (500 mg total) by mouth every 6 (six) hours as needed for muscle spasms. -     methocarbamol (ROBAXIN) 500 MG tablet; Take 1 tablet (500 mg total) by mouth every 6 (six) hours as needed for muscle spasms. -     meloxicam (MOBIC) 15 MG tablet; Take 1 tablet (15 mg total) by mouth daily as needed for pain.  Vagina itching -     fluconazole (DIFLUCAN) 150 MG tablet; Take 1 tablet (150 mg total) by mouth once.   There are no diagnoses linked to this encounter.  No orders of the defined types were placed in this encounter.   Follow-up: Return in about 6 weeks (around 07/18/2016) for low back pain .   Boykin Nearing MD

## 2016-06-07 ENCOUNTER — Telehealth: Payer: Self-pay | Admitting: Family Medicine

## 2016-06-07 NOTE — Telephone Encounter (Signed)
Pt. Called requesting to speak with her nurse regarding her pap smear that she had. Please f/u

## 2016-06-14 ENCOUNTER — Telehealth: Payer: Self-pay

## 2016-06-14 NOTE — Telephone Encounter (Signed)
[employer] [place] Id: [id number] contacted pt to go over lab results pt is aware of lab results pt states she already received results for and already tok medicine for BV

## 2016-09-08 ENCOUNTER — Encounter: Payer: Self-pay | Admitting: Family Medicine

## 2016-09-08 ENCOUNTER — Ambulatory Visit: Payer: Self-pay | Attending: Family Medicine | Admitting: Family Medicine

## 2016-09-08 VITALS — BP 107/73 | HR 84 | Temp 97.9°F | Resp 16 | Wt 162.0 lb

## 2016-09-08 DIAGNOSIS — F17210 Nicotine dependence, cigarettes, uncomplicated: Secondary | ICD-10-CM | POA: Insufficient documentation

## 2016-09-08 DIAGNOSIS — Z79899 Other long term (current) drug therapy: Secondary | ICD-10-CM | POA: Insufficient documentation

## 2016-09-08 DIAGNOSIS — L280 Lichen simplex chronicus: Secondary | ICD-10-CM | POA: Insufficient documentation

## 2016-09-08 DIAGNOSIS — M549 Dorsalgia, unspecified: Secondary | ICD-10-CM | POA: Insufficient documentation

## 2016-09-08 MED ORDER — TRIAMCINOLONE ACETONIDE 0.5 % EX OINT: 1.0000 "application " | TOPICAL_OINTMENT | Freq: Two times a day (BID) | CUTANEOUS | 2 refills | Status: DC

## 2016-09-08 NOTE — Progress Notes (Signed)
Subjective:  Patient ID: Eileen [name], female    DOB: 07/07/1971  Age: 46 y.o. MRN: [id number] Spanish interpreter [place]ote ID # [id number] CC: Rash   HPI Eileen [name] presents for   1. Back pain: resolved.    2. Lichen simplex chronicus: improved with kenalog. She has little itching but not much. She has some rash last week but it resolved.   Social History  Substance Use Topics  . Smoking status: Current Every Day Smoker    Packs/day: 0.50    Years: 20.00    Types: Cigarettes  . Smokeless tobacco: Never Used     Comment: 3-8 CIGS A DAY  . Alcohol use No    Outpatient Medications Prior to Visit  Medication Sig Dispense Refill  . acetaminophen (TYLENOL) 325 MG tablet Take 650 mg by mouth every 6 (six) hours as needed for mild pain.    Marland Kitchen albuterol (PROVENTIL HFA;VENTOLIN HFA) 108 (90 Base) MCG/ACT inhaler Inhale 2 puffs into the lungs every 4 (four) hours as needed for wheezing or shortness of breath. (Patient not taking: Reported on 06/06/2016) 54 g 3  . meloxicam (MOBIC) 15 MG tablet Take 1 tablet (15 mg total) by mouth daily as needed for pain. 30 tablet 1  . methocarbamol (ROBAXIN) 500 MG tablet Take 1 tablet (500 mg total) by mouth every 6 (six) hours as needed for muscle spasms. 60 tablet 0   No facility-administered medications prior to visit.     ROS Review of Systems  Constitutional: Negative for chills and fever.  Eyes: Negative for visual disturbance.  Respiratory: Negative for shortness of breath.   Cardiovascular: Negative for chest pain.  Gastrointestinal: Negative for abdominal pain and blood in stool.  Genitourinary:       Vaginal itching   Musculoskeletal: Positive for back pain. Negative for arthralgias.  Skin: Negative for rash.  Allergic/Immunologic: Negative for immunocompromised state.  Hematological: Negative for adenopathy. Does not bruise/bleed easily.  Psychiatric/Behavioral: Negative for dysphoric mood and suicidal ideas.     Objective:  BP 107/73 (BP Location: Left Arm, Patient Position: Sitting, Cuff Size: Small)   Pulse 84   Temp 97.9 F (36.6 C) (Oral)   Resp 16   Wt 162 lb (73.5 kg)   SpO2 98%   BMI 27.81 kg/m   BP/Weight 09/08/2016 [id number]  Systolic BP [id number]  Diastolic BP 73 69 67  Wt. (Lbs) 162 160 164.6  BMI 27.81 27.46 28.25    Physical Exam  Constitutional: She appears well-developed and well-nourished. No distress.  Cardiovascular: Normal rate, regular rhythm, normal heart sounds and intact distal pulses.   Pulmonary/Chest: Effort normal and breath sounds normal.  Genitourinary: Uterus normal. Pelvic exam was performed with patient prone. There is no rash, tenderness or lesion on the right labia. There is no rash, tenderness or lesion on the left labia. Cervix exhibits no motion tenderness, no discharge and no friability.  Musculoskeletal: She exhibits no edema.  Back Exam: Back: Normal Curvature, no deformities or CVA tenderness  Paraspinal Tenderness: absent   LE Strength 5/5  LE Sensation: in tact  LE Reflexes 2+ and symmetric  Straight leg raise: negative   Lymphadenopathy:       Right: No inguinal adenopathy present.       Left: No inguinal adenopathy present.  Skin: Skin is warm and dry. No rash noted.     Assessment & Plan:  Eileen [name] was seen today for rash.  Diagnoses and all orders for this visit:  Lichen simplex chronicus -     triamcinolone ointment (KENALOG) 0.5 %; Apply 1 application topically 2 (two) times daily. To vulvar area as needed for rash for 3-5 days   There are no diagnoses linked to this encounter.  No orders of the defined types were placed in this encounter.   Follow-up: Return in about 6 months (around 03/08/2017) for check up .   Boykin Nearing MD

## 2016-09-08 NOTE — Patient Instructions (Addendum)
Aundraya was seen today for rash.  Diagnoses and all orders for this visit:  Lichen simplex chronicus -     triamcinolone ointment (KENALOG) 0.5 %; Apply 1 application topically 2 (two) times daily. To vulvar area as needed for rash for 3-5 days    F/u in 6 months sooner if needed   Dr. Adrian Blackwater

## 2016-10-05 MED FILL — TRIAMCINOLONE 0.5% OINTMENT: 0.5 | 5 days supply | Qty: 15 | Fill #0

## 2017-01-10 ENCOUNTER — Encounter: Payer: Self-pay | Admitting: Family Medicine

## 2017-03-21 ENCOUNTER — Other Ambulatory Visit: Payer: Self-pay | Admitting: Internal Medicine

## 2017-03-21 ENCOUNTER — Ambulatory Visit: Payer: Self-pay | Attending: Family Medicine | Admitting: Physician Assistant

## 2017-03-21 VITALS — BP 108/67 | HR 86 | Temp 98.2°F | Resp 16 | Wt 161.6 lb

## 2017-03-21 DIAGNOSIS — J4520 Mild intermittent asthma, uncomplicated: Secondary | ICD-10-CM

## 2017-03-21 DIAGNOSIS — G709 Myoneural disorder, unspecified: Secondary | ICD-10-CM | POA: Insufficient documentation

## 2017-03-21 DIAGNOSIS — G8929 Other chronic pain: Secondary | ICD-10-CM

## 2017-03-21 DIAGNOSIS — M545 Low back pain: Secondary | ICD-10-CM | POA: Insufficient documentation

## 2017-03-21 DIAGNOSIS — N189 Chronic kidney disease, unspecified: Secondary | ICD-10-CM | POA: Insufficient documentation

## 2017-03-21 DIAGNOSIS — J45909 Unspecified asthma, uncomplicated: Secondary | ICD-10-CM | POA: Insufficient documentation

## 2017-03-21 DIAGNOSIS — I509 Heart failure, unspecified: Secondary | ICD-10-CM | POA: Insufficient documentation

## 2017-03-21 DIAGNOSIS — M1990 Unspecified osteoarthritis, unspecified site: Secondary | ICD-10-CM | POA: Insufficient documentation

## 2017-03-21 DIAGNOSIS — L280 Lichen simplex chronicus: Secondary | ICD-10-CM

## 2017-03-21 MED ORDER — IBUPROFEN 600 MG PO TABS: 600.0000 mg | ORAL_TABLET | Freq: Three times a day (TID) | ORAL | 0 refills | Status: DC | PRN

## 2017-03-21 MED ORDER — TRIAMCINOLONE ACETONIDE 0.5 % EX OINT
1.0000 | TOPICAL_OINTMENT | Freq: Two times a day (BID) | CUTANEOUS | 2 refills | Status: DC
Start: 2017-03-21 — End: 2018-09-06

## 2017-03-21 MED FILL — VENTOLIN HFA 90 MCG INHALER: 108 (90 BAS | 30 days supply | Qty: 18 | Fill #0

## 2017-03-21 MED FILL — TRIAMCINOLONE 0.5% OINTMENT: 0.5 | 3 days supply | Qty: 15 | Fill #0

## 2017-03-21 MED FILL — IBUPROFEN 600 MG TABLET: 600 | 20 days supply | Qty: 60 | Fill #0

## 2017-03-21 NOTE — Progress Notes (Signed)
Eileen [name], is a 46 y.o. female  [id number]  [id number]  DOB - 02/21/1971  Subjective:  Chief Complaint and HPI: Eileen [name] is a 46 y.o. female here today needing a RF of triamcinilone for lichen simplex chronicus and needs medication for pain and inflammation in her lower back that occurs occasionally. NKI.  No vaginal discharge.  No pelvic pain.  The rash and itching have improved in the vulvar region since starting the cream.  She denies any radicular s/sx.  No f/c.  No urinary s/sx.  ROS:   Constitutional:  No f/c, No night sweats, No unexplained weight loss. EENT:  No vision changes, No blurry vision, No hearing changes. No mouth, throat, or ear problems.  Respiratory: No cough, No SOB Cardiac: No CP, no palpitations GI:  No abd pain, No N/V/D. GU: No Urinary s/sx Musculoskeletal: + intermittent back pain Neuro: No headache, no dizziness, no motor weakness.  Skin: No rash Endocrine:  No polydipsia. No polyuria.  Psych: Denies SI/HI  No problems updated.  ALLERGIES: No Known Allergies  PAST MEDICAL HISTORY: Past Medical History:  Diagnosis Date  . Arthritis   . Asthma   . CHF (congestive heart failure) ([place])    "unsure thinks lungs had fluid around after surgery 7 years ago"  . Chronic kidney disease    kidney infection  . Neuromuscular disorder ([place])    HERNIA  . Right inguinal hernia   . Shortness of breath     MEDICATIONS AT HOME: Prior to Admission medications   Medication Sig Start Date End Date Taking? Authorizing Provider  triamcinolone ointment (KENALOG) 0.5 % Apply 1 application topically 2 (two) times daily. To vulvar area as needed for rash for 3-5 days 03/21/17  Yes Argentina Donovan, PA-C  acetaminophen (TYLENOL) 325 MG tablet Take 650 mg by mouth every 6 (six) hours as needed for mild pain.    [provider]  albuterol (PROVENTIL HFA;VENTOLIN HFA) 108 (90 Base) MCG/ACT inhaler Inhale 2 puffs into the lungs  every 4 (four) hours as needed for wheezing or shortness of breath. Patient not taking: Reported on 06/06/2016 10/04/15   Tresa Garter, MD  ibuprofen (ADVIL,MOTRIN) 600 MG tablet Take 1 tablet (600 mg total) by mouth every 8 (eight) hours as needed. 03/21/17   Argentina Donovan, PA-C     Objective:  EXAM:   Vitals:   03/21/17 1554  BP: 108/67  Pulse: 86  Resp: 16  Temp: 98.2 F (36.8 C)  TempSrc: Oral  SpO2: 96%  Weight: 161 lb 9.6 oz (73.3 kg)    General appearance : A&OX3. NAD. Non-toxic-appearing HEENT: Atraumatic and Normocephalic.  PERRLA. EOM intact.  Neck: supple, no JVD. No cervical lymphadenopathy. No thyromegaly Chest/Lungs:  Breathing-non-labored, Good air entry bilaterally, breath sounds normal without rales, rhonchi, or wheezing  CVS: S1 S2 regular, no murmurs, gallops, rubs  Back: full S&ROM, paraspinus spasm L>R.  No CVA TTP.  Neg SLR B. Extremities: Bilateral Lower Ext shows no edema, both legs are warm to touch with = pulse throughout Neurology:  CN II-XII grossly intact, Non focal.   Psych:  TP linear. J/I WNL. Normal speech. Appropriate eye contact and affect.  Skin:  No Rash  Data Review Lab Results  Component Value Date   HGBA1C 5.5 11/18/2015     Assessment & Plan   1. Lichen simplex chronicus - triamcinolone ointment (KENALOG) 0.5 %; Apply 1 application topically 2 (two) times daily. To vulvar area as needed for  rash for 3-5 days  Dispense: 15 g; Refill: 2  2. Chronic low back pain without sciatica, unspecified back pain laterality No changes; no red flags Ibuprofen Rx sent to use prn  Patient have been counseled extensively about nutrition and exercise  Return in about 3 months (around 06/21/2017) for assign new PCP.  The patient was given clear instructions to go to ER or return to medical center if symptoms don't improve, worsen or new problems develop. The patient verbalized understanding. The patient was told to call to get lab  results if they haven't heard anything in the next week.     Freeman Caldron, PA-C [facility] and [place], [place]   03/21/2017, 4:05 PMPatient ID: Eileen [name], female   DOB: [birth year], 46 y.o.   MRN: [id number]

## 2017-04-27 ENCOUNTER — Encounter: Payer: Self-pay | Admitting: Family Medicine

## 2017-04-27 ENCOUNTER — Ambulatory Visit: Payer: Self-pay | Attending: Family Medicine | Admitting: Family Medicine

## 2017-04-27 VITALS — BP 103/65 | HR 84 | Temp 98.1°F | Wt 159.2 lb

## 2017-04-27 DIAGNOSIS — I509 Heart failure, unspecified: Secondary | ICD-10-CM | POA: Insufficient documentation

## 2017-04-27 DIAGNOSIS — L280 Lichen simplex chronicus: Secondary | ICD-10-CM | POA: Insufficient documentation

## 2017-04-27 DIAGNOSIS — N189 Chronic kidney disease, unspecified: Secondary | ICD-10-CM | POA: Insufficient documentation

## 2017-04-27 DIAGNOSIS — Z79899 Other long term (current) drug therapy: Secondary | ICD-10-CM | POA: Insufficient documentation

## 2017-04-27 DIAGNOSIS — J4520 Mild intermittent asthma, uncomplicated: Secondary | ICD-10-CM | POA: Insufficient documentation

## 2017-04-27 DIAGNOSIS — M545 Low back pain, unspecified: Secondary | ICD-10-CM

## 2017-04-27 DIAGNOSIS — Z23 Encounter for immunization: Secondary | ICD-10-CM | POA: Insufficient documentation

## 2017-04-27 NOTE — Patient Instructions (Signed)
Influenza Virus Vaccine injection (Fluarix) Qu es este medicamento? La VACUNA ANTIGRIPAL ayuda a disminuir el riesgo de contraer la influenza, tambin conocida como la gripe. La vacuna solo ayuda a protegerle contra algunas cepas de influenza. Esta vacuna no ayuda a reducir el riesgo de contraer influenza pandmica H1N1. Este medicamento puede ser utilizado para otros usos; si tiene alguna pregunta consulte con su proveedor de atencin mdica o con su farmacutico. MARCAS COMUNES: Fluarix, Fluzone Qu le debo informar a mi profesional de la salud antes de tomar este medicamento? Necesita saber si usted presenta alguno de los siguientes problemas o situaciones: -trastorno de sangrado como hemofilia -fiebre o infeccin -sndrome de Guillain-Barre u otros problemas neurolgicos -problemas del sistema inmunolgico -infeccin por el virus de la inmunodeficiencia humana (VIH) o SIDA -niveles bajos de plaquetas en la sangre -esclerosis mltiple -una reaccin alrgica o inusual a las vacunas antigripales, a los huevos, protenas de pollo, al ltex, a la gentamicina, a otros medicamentos, alimentos, colorantes o conservantes -si est embarazada o buscando quedar embarazada -si est amamantando a un beb Cmo debo utilizar este medicamento? Esta vacuna se administra mediante inyeccin por va intramuscular. Lo administra un profesional de la salud. Recibir una copia de informacin escrita sobre la vacuna antes de cada vacuna. Asegrese de leer este folleto cada vez cuidadosamente. Este folleto puede cambiar con frecuencia. Hable con su pediatra para informarse acerca del uso de este medicamento en nios. Puede requerir atencin especial. Sobredosis: Pngase en contacto inmediatamente con un centro toxicolgico o una sala de urgencia si usted cree que haya tomado demasiado medicamento. ATENCIN: Este medicamento es solo para usted. No comparta este medicamento con nadie. Qu sucede si me olvido de  una dosis? No se aplica en este caso. Qu puede interactuar con este medicamento? -quimioterapia o radioterapia -medicamentos que suprimen el sistema inmunolgico, tales como etanercept, anakinra, infliximab y adalimumab -medicamentos que tratan o previenen cogulos sanguneos, como warfarina -fenitona -medicamentos esteroideos, como la prednisona o la cortisona -teofilina -vacunas Puede ser que esta lista no menciona todas las posibles interacciones. Informe a su profesional de la salud de todos los productos a base de hierbas, medicamentos de venta libre o suplementos nutritivos que est tomando. Si usted fuma, consume bebidas alcohlicas o si utiliza drogas ilegales, indqueselo tambin a su profesional de la salud. Algunas sustancias pueden interactuar con su medicamento. A qu debo estar atento al usar este medicamento? Informe a su mdico o a su profesional de la salud sobre todos los efectos secundarios que persistan despus de 3 das. Llame a su proveedor de atencin mdica si se presentan sntomas inusuales dentro de las 6 semanas posteriores a la vacunacin. Es posible que todava pueda contraer la gripe, pero la enfermedad no ser tan fuerte como normalmente. No puede contraer la gripe de esta vacuna. La vacuna antigripal no le protege contra resfros u otras enfermedades que pueden causar fiebre. Debe vacunarse cada ao. Qu efectos secundarios puedo tener al utilizar este medicamento? Efectos secundarios que debe informar a su mdico o a su profesional de la salud tan pronto como sea posible: -reacciones alrgicas como erupcin cutnea, picazn o urticarias, hinchazn de la cara, labios o lengua Efectos secundarios que, por lo general, no requieren atencin mdica (debe informarlos a su mdico o a su profesional de la salud si persisten o si son molestos): -fiebre -dolor de cabeza -molestias y dolores musculares -dolor, sensibilidad, enrojecimiento o hinchazn en el lugar de la  inyeccin -cansancio o debilidad Puede ser que esta lista no   menciona todos los posibles efectos secundarios. Comunquese a su mdico por asesoramiento mdico sobre los efectos secundarios. Usted puede informar los efectos secundarios a la FDA por telfono al 1-800-FDA-1088. Dnde debo guardar mi medicina? Esta vacuna se administra solamente en clnicas, farmacias, consultorio mdico u otro consultorio de un profesional de la salud y no necesitar guardarlo en su domicilio. ATENCIN: Este folleto es un resumen. Puede ser que no cubra toda la posible informacin. Si usted tiene preguntas acerca de esta medicina, consulte con su mdico, su farmacutico o su profesional de la salud.  2018 Elsevier/Gold Standard (2010-02-15 15:31:40)  

## 2017-04-27 NOTE — Progress Notes (Signed)
Subjective:  Patient ID: Eileen [name], female    DOB: 07/30/1971  Age: 46 y.o. MRN: [id number]  CC: Gynecologic Exam   HPI Eileen [name] is a 46 year old female with a history of lichen simplex chronicus, bilateral low back pain, intermittent asthma who presents today for a Pap smear. Last Pap smear was 04/2016-negative for malignancy She started her period today.  The back pain which she complained of at her last last visit responded to ibuprofen. Asthma has been controlled and she longer needs to use her rescue inhaler. She no longer needs the triamcinolone cream as well.  Past Medical History:  Diagnosis Date  . Arthritis   . Asthma   . CHF (congestive heart failure) ([place])    "unsure thinks lungs had fluid around after surgery 7 years ago"  . Chronic kidney disease    kidney infection  . Neuromuscular disorder ([place])    HERNIA  . Right inguinal hernia   . Shortness of breath     Past Surgical History:  Procedure Laterality Date  . CESAREAN SECTION     x2  . CESAREAN SECTION    . HERNIA REPAIR  09/12/11   [facility]  . HERNIA REPAIR    . INGUINAL HERNIA REPAIR  09/12/2011   Procedure: HERNIA REPAIR INGUINAL ADULT;  Surgeon: Gayland Curry, MD;  Location: [facility];  Service: General;  Laterality: Right;  open right inguinal hernia with mesh   . TUBAL LIGATION      No Known Allergies    Outpatient Medications Prior to Visit  Medication Sig Dispense Refill  . acetaminophen (TYLENOL) 325 MG tablet Take 650 mg by mouth every 6 (six) hours as needed for mild pain.    Marland Kitchen ibuprofen (ADVIL,MOTRIN) 600 MG tablet Take 1 tablet (600 mg total) by mouth every 8 (eight) hours as needed. (Patient not taking: Reported on 04/27/2017) 60 tablet 0  . triamcinolone ointment (KENALOG) 0.5 % Apply 1 application topically 2 (two) times daily. To vulvar area as needed for rash for 3-5 days (Patient not taking: Reported on 04/27/2017) 15 g 2  . VENTOLIN HFA 108 (90 Base) MCG/ACT inhaler  INHALE 2 PUFFS EVERY 4 HOURS AS NEEDED FOR WHEEZING OR SHORTNESS OF BREATH (Patient not taking: Reported on 04/27/2017) 18 g 1   No facility-administered medications prior to visit.     ROS Review of Systems  Constitutional: Negative for activity change, appetite change and fatigue.  HENT: Negative for congestion, sinus pressure and sore throat.   Eyes: Negative for visual disturbance.  Respiratory: Negative for cough, chest tightness, shortness of breath and wheezing.   Cardiovascular: Negative for chest pain and palpitations.  Gastrointestinal: Negative for abdominal distention, abdominal pain and constipation.  Endocrine: Negative for polydipsia.  Genitourinary: Negative for dysuria and frequency.  Musculoskeletal: Negative for arthralgias and back pain.  Skin: Negative for rash.  Neurological: Negative for tremors, light-headedness and numbness.  Hematological: Does not bruise/bleed easily.  Psychiatric/Behavioral: Negative for agitation and behavioral problems.    Objective:  BP 103/65   Pulse 84   Temp 98.1 F (36.7 C) (Oral)   Wt 159 lb 3.2 oz (72.2 kg)   SpO2 98%   BMI 27.33 kg/m   BP/Weight 04/27/2017 03/21/2017 4/74/2595  Systolic BP 638 756 433  Diastolic BP 65 67 73  Wt. (Lbs) 159.2 161.6 162  BMI 27.33 27.74 27.81      Physical Exam  Constitutional: She is oriented to person, place, and time. She appears well-developed and  well-nourished.  Cardiovascular: Normal rate, normal heart sounds and intact distal pulses.   No murmur heard. Pulmonary/Chest: Effort normal and breath sounds normal. She has no wheezes. She has no rales. She exhibits no tenderness.  Abdominal: Soft. Bowel sounds are normal. She exhibits no distension and no mass. There is no tenderness.  Genitourinary:  Genitourinary Comments: Blood on external genitalia  Musculoskeletal: Normal range of motion.  Neurological: She is alert and oriented to person, place, and time.     Assessment &  Plan:   1. Bilateral low back pain without sciatica, unspecified chronicity Stable Use ibuprofen as needed  2. Need for influenza vaccination Influenza vaccination administered  3. Lichen simplex chronicus Improved significantly Triamcinolone when necessary  4. Mild intermittent asthma without complication No acute flares Use bronchodilator when necessary   No orders of the defined types were placed in this encounter.   Follow-up: Return in about 2 weeks (around 05/11/2017) for PAP smear.   Arnoldo Morale MD

## 2017-06-05 ENCOUNTER — Encounter (HOSPITAL_COMMUNITY): Payer: Self-pay

## 2017-06-12 ENCOUNTER — Ambulatory Visit: Payer: Self-pay | Attending: Family Medicine | Admitting: Family Medicine

## 2017-06-12 ENCOUNTER — Encounter: Payer: Self-pay | Admitting: Family Medicine

## 2017-06-12 VITALS — BP 102/62 | HR 78 | Temp 98.6°F | Wt 160.2 lb

## 2017-06-12 DIAGNOSIS — Z791 Long term (current) use of non-steroidal anti-inflammatories (NSAID): Secondary | ICD-10-CM | POA: Insufficient documentation

## 2017-06-12 DIAGNOSIS — L298 Other pruritus: Secondary | ICD-10-CM | POA: Insufficient documentation

## 2017-06-12 DIAGNOSIS — N189 Chronic kidney disease, unspecified: Secondary | ICD-10-CM | POA: Insufficient documentation

## 2017-06-12 DIAGNOSIS — Z124 Encounter for screening for malignant neoplasm of cervix: Secondary | ICD-10-CM | POA: Insufficient documentation

## 2017-06-12 DIAGNOSIS — N898 Other specified noninflammatory disorders of vagina: Secondary | ICD-10-CM

## 2017-06-12 DIAGNOSIS — J45909 Unspecified asthma, uncomplicated: Secondary | ICD-10-CM | POA: Insufficient documentation

## 2017-06-12 DIAGNOSIS — Z79899 Other long term (current) drug therapy: Secondary | ICD-10-CM | POA: Insufficient documentation

## 2017-06-12 DIAGNOSIS — I509 Heart failure, unspecified: Secondary | ICD-10-CM | POA: Insufficient documentation

## 2017-06-12 DIAGNOSIS — Z9889 Other specified postprocedural states: Secondary | ICD-10-CM | POA: Insufficient documentation

## 2017-06-12 MED ORDER — CLOTRIMAZOLE 1 % EX CREA
1.0000 | TOPICAL_CREAM | Freq: Two times a day (BID) | CUTANEOUS | 1 refills | Status: DC
Start: 2017-06-12 — End: 2018-09-06

## 2017-06-12 NOTE — Progress Notes (Signed)
Subjective:  Patient ID: Eileen [name], female    DOB: 1970/09/08  Age: 46 y.o. MRN: [id number]  CC: Gynecologic Exam   HPI Danyale Ridinger presents for A Pap smear. She complains of a genital itching which occur just before her periods but no vaginal discharge. Denies urinary symptoms.  Past Medical History:  Diagnosis Date  . Arthritis   . Asthma   . CHF (congestive heart failure) ([place])    "unsure thinks lungs had fluid around after surgery 7 years ago"  . Chronic kidney disease    kidney infection  . Neuromuscular disorder ([place])    HERNIA  . Right inguinal hernia   . Shortness of breath     Past Surgical History:  Procedure Laterality Date  . CESAREAN SECTION     x2  . CESAREAN SECTION    . HERNIA REPAIR  09/12/11   RIH  . HERNIA REPAIR    . INGUINAL HERNIA REPAIR  09/12/2011   Procedure: HERNIA REPAIR INGUINAL ADULT;  Surgeon: Gayland Curry, MD;  Location: [facility];  Service: General;  Laterality: Right;  open right inguinal hernia with mesh   . TUBAL LIGATION      No Known Allergies    Outpatient Medications Prior to Visit  Medication Sig Dispense Refill  . acetaminophen (TYLENOL) 325 MG tablet Take 650 mg by mouth every 6 (six) hours as needed for mild pain.    Marland Kitchen ibuprofen (ADVIL,MOTRIN) 600 MG tablet Take 1 tablet (600 mg total) by mouth every 8 (eight) hours as needed. (Patient not taking: Reported on 04/27/2017) 60 tablet 0  . triamcinolone ointment (KENALOG) 0.5 % Apply 1 application topically 2 (two) times daily. To vulvar area as needed for rash for 3-5 days (Patient not taking: Reported on 04/27/2017) 15 g 2  . VENTOLIN HFA 108 (90 Base) MCG/ACT inhaler INHALE 2 PUFFS EVERY 4 HOURS AS NEEDED FOR WHEEZING OR SHORTNESS OF BREATH (Patient not taking: Reported on 04/27/2017) 18 g 1   No facility-administered medications prior to visit.     ROS Review of Systems  Constitutional: Negative for activity change, appetite change and fatigue.  HENT:  Negative for congestion, sinus pressure and sore throat.   Eyes: Negative for visual disturbance.  Respiratory: Negative for cough, chest tightness, shortness of breath and wheezing.   Cardiovascular: Negative for chest pain and palpitations.  Gastrointestinal: Negative for abdominal distention, abdominal pain and constipation.  Endocrine: Negative for polydipsia.  Genitourinary: Negative for dysuria and frequency.  Musculoskeletal: Negative for arthralgias and back pain.  Skin: Negative for rash.  Neurological: Negative for tremors, light-headedness and numbness.  Hematological: Does not bruise/bleed easily.  Psychiatric/Behavioral: Negative for agitation and behavioral problems.    Objective:  BP 102/62   Pulse 78   Temp 98.6 F (37 C) (Oral)   Wt 160 lb 3.2 oz (72.7 kg)   SpO2 99%   BMI 27.50 kg/m   BP/Weight 06/12/2017 04/27/2017 8/84/1660  Systolic BP 630 160 109  Diastolic BP 62 65 67  Wt. (Lbs) 160.2 159.2 161.6  BMI 27.5 27.33 27.74      Physical Exam  Constitutional: She is oriented to person, place, and time. She appears well-developed and well-nourished.  Cardiovascular: Normal rate, normal heart sounds and intact distal pulses.   No murmur heard. Pulmonary/Chest: Effort normal and breath sounds normal. She has no wheezes. She has no rales. She exhibits no tenderness.  Abdominal: Soft. Bowel sounds are normal. She exhibits no distension and no mass.  There is no tenderness.  Genitourinary:  Genitourinary Comments: External genitalia, vagina, cervix,adnexa-All normal  Musculoskeletal: Normal range of motion.  Neurological: She is alert and oriented to person, place, and time.     Assessment & Plan:   1. Screening for cervical cancer - Cytology - PAP [place]  2. Vaginal itching Placed on Clomtrimazole   Meds ordered this encounter  Medications  . clotrimazole (LOTRIMIN) 1 % cream    Sig: Apply 1 application topically 2 (two) times daily.     Dispense:  30 g    Refill:  1    Follow-up: Return in about 6 months (around 12/11/2017) for Follow-up on asthma.   Arnoldo Morale MD

## 2017-06-12 NOTE — Patient Instructions (Signed)

## 2017-06-15 LAB — CYTOLOGY - PAP
Diagnosis: NEGATIVE
HPV: NOT DETECTED

## 2017-06-24 ENCOUNTER — Emergency Department (HOSPITAL_COMMUNITY): Payer: Self-pay

## 2017-06-24 ENCOUNTER — Emergency Department (HOSPITAL_COMMUNITY)
Admission: EM | Admit: 2017-06-24 | Discharge: 2017-06-24 | Disposition: A | Discharge status: Home / Self Care | Payer: Self-pay | Attending: Emergency Medicine | Admitting: Emergency Medicine

## 2017-06-24 ENCOUNTER — Encounter (HOSPITAL_COMMUNITY): Payer: Self-pay | Admitting: Emergency Medicine

## 2017-06-24 DIAGNOSIS — N189 Chronic kidney disease, unspecified: Secondary | ICD-10-CM | POA: Insufficient documentation

## 2017-06-24 DIAGNOSIS — I509 Heart failure, unspecified: Secondary | ICD-10-CM | POA: Insufficient documentation

## 2017-06-24 DIAGNOSIS — J40 Bronchitis, not specified as acute or chronic: Secondary | ICD-10-CM | POA: Insufficient documentation

## 2017-06-24 DIAGNOSIS — F17210 Nicotine dependence, cigarettes, uncomplicated: Secondary | ICD-10-CM | POA: Insufficient documentation

## 2017-06-24 LAB — URINALYSIS, ROUTINE W REFLEX MICROSCOPIC
BACTERIA UA: NONE SEEN
Bilirubin Urine: NEGATIVE
Glucose, UA: NEGATIVE mg/dL
Ketones, ur: NEGATIVE mg/dL
Leukocytes, UA: NEGATIVE
NITRITE: NEGATIVE
Protein, ur: NEGATIVE mg/dL
SPECIFIC GRAVITY, URINE: 1.01 (ref 1.005–1.030)
pH: 5 (ref 5.0–8.0)

## 2017-06-24 MED ORDER — ACETAMINOPHEN 500 MG PO TABS
1000.0000 mg | ORAL_TABLET | Freq: Once | ORAL | Status: AC
  Administered 2017-06-24: 1000 mg via ORAL
  Filled 2017-06-24: qty 2

## 2017-06-24 MED ORDER — AEROCHAMBER PLUS FLO-VU LARGE MISC
1.0000 | Freq: Once | Status: AC
  Administered 2017-06-24: 1

## 2017-06-24 MED ORDER — ALBUTEROL SULFATE HFA 108 (90 BASE) MCG/ACT IN AERS
2.0000 | INHALATION_SPRAY | RESPIRATORY_TRACT | Status: DC | PRN
  Administered 2017-06-24: 2 via RESPIRATORY_TRACT
  Filled 2017-06-24: qty 6.7

## 2017-06-24 NOTE — ED Notes (Signed)
The pt is c/o of coughing  Her color is good her stas are also good.  No distress her family is very nervous about the coughing.

## 2017-06-24 NOTE — ED Notes (Signed)
POC urine preg NEGATIVE. 

## 2017-06-24 NOTE — ED Triage Notes (Signed)
Pt c/o cough, abdominal pain that radiates into chest and around back, and vomiting onset Friday. Other family members with recent illness.

## 2017-06-24 NOTE — Discharge Instructions (Signed)
Use your albuterol inhaler.  2 puffs every 4 hours as needed for cough or shortness of breath.  Return if needed more than every 4 hours.  Take Tylenol every 4 hours as directed for aches.  See your primary care physician at the [facility] if not feeling better in 4 or 5 days.  Return if concern for any reason

## 2017-06-24 NOTE — ED Provider Notes (Signed)
[facility] EMERGENCY DEPARTMENT Provider Note   CSN: [id number] Arrival date & time: 06/24/17  1731  History is obtained from medical interpreter patient speaks no English   History   Chief Complaint Chief Complaint  Patient presents with  . Cough  . Shortness of Breath    HPI Eileen [name] is a 46 y.o. female.  Complains of nonproductive cough and shortness of breath onset 2 days ago.  Symptoms accompanied by subjective fever.  She also complains of lower abdominal pain which is worse with coughing and dysuria onset today with pain on urination at urethral meatus.  She vomited one time yesterday and one time today, both times posttussive vomiting.  She treated herself with equate, and over-the-counter cold medicine, without relief.  No other associated symptoms.  Last normal menstrual period ended 2 days ago.  Bowel movements normal.  She is presently hungry.  Nothing makes symptoms better.  Abdominal pain presently is moderate.  HPI  Past Medical History:  Diagnosis Date  . Arthritis   . Asthma   . CHF (congestive heart failure) ([place])    "unsure thinks lungs had fluid around after surgery 7 years ago"  . Chronic kidney disease    kidney infection  . Neuromuscular disorder ([place])    HERNIA  . Right inguinal hernia   . Shortness of breath     Patient Active Problem List   Diagnosis Date Noted  . Bilateral low back pain without sciatica 06/06/2016  . 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 Asthma 09/24/2014  . History of adenomatous polyp of colon 11/07/2013  . Chronic RLQ pain 11/07/2013  . 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 Current smoker 09/26/2013    Past Surgical History:  Procedure Laterality Date  . CESAREAN SECTION     x2  . CESAREAN SECTION    . HERNIA REPAIR  09/12/11   RIH  . HERNIA REPAIR    . INGUINAL HERNIA REPAIR  09/12/2011   Procedure: HERNIA REPAIR INGUINAL ADULT;  Surgeon: Gayland Curry, MD;  Location: [facility];   Service: General;  Laterality: Right;  open right inguinal hernia with mesh   . TUBAL LIGATION      OB History    Gravida Para Term Preterm AB Living   5 4 4  0 1     SAB TAB Ectopic Multiple Live Births   1 0 0           Home Medications    Prior to Admission medications   Medication Sig Start Date End Date Taking? Authorizing Provider  acetaminophen (TYLENOL) 325 MG tablet Take 650 mg by mouth every 6 (six) hours as needed for mild pain.    [provider]  clotrimazole (LOTRIMIN) 1 % cream Apply 1 application topically 2 (two) times daily. 06/12/17   Arnoldo Morale, MD  ibuprofen (ADVIL,MOTRIN) 600 MG tablet Take 1 tablet (600 mg total) by mouth every 8 (eight) hours as needed. Patient not taking: Reported on 04/27/2017 03/21/17   Argentina Donovan, PA-C  triamcinolone ointment (KENALOG) 0.5 % Apply 1 application topically 2 (two) times daily. To vulvar area as needed for rash for 3-5 days Patient not taking: Reported on 04/27/2017 03/21/17   Argentina Donovan, PA-C  VENTOLIN HFA 108 (90 Base) MCG/ACT inhaler INHALE 2 PUFFS EVERY 4 HOURS AS NEEDED FOR WHEEZING OR SHORTNESS OF BREATH Patient not taking: Reported on 04/27/2017 03/21/17   Boykin Nearing, MD    Family History Family History  Problem Relation Age of Onset  . Ulcers Father        stomach  . Diabetes Brother   . Colon cancer Neg Hx   . Rectal cancer Neg Hx   . Stomach cancer Neg Hx     Social History Social History  Substance Use Topics  . Smoking status: Current Every Day Smoker    Packs/day: 0.50    Years: 20.00    Types: Cigarettes  . Smokeless tobacco: Never Used     Comment: 3-8 CIGS A DAY  . Alcohol use No  No illicit drug use   Allergies   Patient has no known allergies.   Review of Systems Review of Systems  Constitutional: Positive for fever.       Subjective fever  Respiratory: Positive for cough and shortness of breath.   Gastrointestinal: Positive for abdominal pain and vomiting.        Posttussive vomiting, no nausea  Genitourinary: Positive for dysuria.  All other systems reviewed and are negative.    Physical Exam Updated Vital Signs BP 102/68   Pulse 76   Temp 98.3 F (36.8 C) (Oral)   Resp (!) 22   Ht 5\' 2"  (1.575 m)   Wt 72.6 kg (160 lb)   LMP 06/18/2017   SpO2 100%   BMI 29.26 kg/m   Physical Exam  Constitutional: She appears well-developed and well-nourished.  HENT:  Head: Normocephalic and atraumatic.  Eyes: Pupils are equal, round, and reactive to light. Conjunctivae are normal.  Neck: Neck supple. No tracheal deviation present. No thyromegaly present.  Cardiovascular: Normal rate and regular rhythm.   No murmur heard. Pulmonary/Chest: Effort normal and breath sounds normal.  Abdominal: Soft. Bowel sounds are normal. She exhibits no distension. There is no tenderness.  Musculoskeletal: Normal range of motion. She exhibits no edema or tenderness.  Neurological: She is alert. Coordination normal.  Skin: Skin is warm and dry. No rash noted.  Psychiatric: She has a normal mood and affect.  Nursing note and vitals reviewed.    ED Treatments / Results  Labs (all labs ordered are listed, but only abnormal results are displayed) Labs Reviewed  URINALYSIS, ROUTINE W REFLEX MICROSCOPIC  POC URINE PREG, ED    EKG  EKG Interpretation None     Urine pregnancy test negative Results for orders placed or performed during the hospital encounter of 06/24/17  Urinalysis, Routine w reflex microscopic  Result Value Ref Range   Color, Urine YELLOW YELLOW   APPearance CLEAR CLEAR   Specific Gravity, Urine 1.010 1.005 - 1.030   pH 5.0 5.0 - 8.0   Glucose, UA NEGATIVE NEGATIVE mg/dL   Hgb urine dipstick SMALL (A) NEGATIVE   Bilirubin Urine NEGATIVE NEGATIVE   Ketones, ur NEGATIVE NEGATIVE mg/dL   Protein, ur NEGATIVE NEGATIVE mg/dL   Nitrite NEGATIVE NEGATIVE   Leukocytes, UA NEGATIVE NEGATIVE   RBC / HPF 0-5 0 - 5 RBC/hpf   WBC, UA 0-5 0 -  5 WBC/hpf   Bacteria, UA NONE SEEN NONE SEEN   Squamous Epithelial / LPF 0-5 (A) NONE SEEN   Mucus PRESENT    Dg Chest 2 View  Result Date: 06/24/2017 CLINICAL DATA:  Productive cough, shortness breath, and chest pain for 2 days. EXAM: CHEST  2 VIEW COMPARISON:  None. FINDINGS: The heart size and mediastinal contours are within normal limits. Low lung volumes are noted. No evidence of pulmonary infiltrate or pleural effusion. IMPRESSION: Low lung volumes.  No active disease.  Electronically Signed   By: Earle Gell M.D.   On: 06/24/2017 18:27    Radiology Dg Chest 2 View  Result Date: 06/24/2017 CLINICAL DATA:  Productive cough, shortness breath, and chest pain for 2 days. EXAM: CHEST  2 VIEW COMPARISON:  None. FINDINGS: The heart size and mediastinal contours are within normal limits. Low lung volumes are noted. No evidence of pulmonary infiltrate or pleural effusion. IMPRESSION: Low lung volumes.  No active disease. Electronically Signed   By: Earle Gell M.D.   On: 06/24/2017 18:27  Chest x-ray reviewed by me  Procedures Procedures (including critical care time)  Medications Ordered in ED Medications  acetaminophen (TYLENOL) tablet 1,000 mg (not administered)  albuterol (PROVENTIL HFA;VENTOLIN HFA) 108 (90 Base) MCG/ACT inhaler 2 puff (not administered)  AEROCHAMBER PLUS FLO-VU LARGE MISC 1 each (not administered)     Initial Impression / Assessment and Plan / ED Course  I have reviewed the triage vital signs and the nursing notes.  Pertinent labs & imaging results that were available during my care of the patient were reviewed by me and considered in my medical decision making (see chart for details).     11:20 PM feels improved after treatment with Tylenol and albuterol HFA with spacer. Strongly suspect viral illness with bronchitis.  Plan butyryl HFA with spacer to go to use 2 puffs every 4 hours as needed cough or shortness of breath.  Tylenol as needed for pain.  Follow-up  with PMD, [facility] if not feeling better in 4 or 5 days  Final Clinical Impressions(s) / ED Diagnoses  Diagnosis acute bronchitis Final diagnoses:  None    New Prescriptions New Prescriptions   No medications on file     Orlie Dakin, MD 06/24/17 2328

## 2017-06-25 LAB — POC URINE PREG, ED: Preg Test, Ur: NEGATIVE

## 2017-06-28 ENCOUNTER — Telehealth: Payer: Self-pay

## 2017-06-28 NOTE — Telephone Encounter (Signed)
Pt was called and informed of lab results via interpreter.

## 2017-08-15 ENCOUNTER — Other Ambulatory Visit: Payer: Self-pay

## 2017-08-15 ENCOUNTER — Ambulatory Visit: Payer: Self-pay | Attending: Family Medicine | Admitting: Physician Assistant

## 2017-08-15 VITALS — BP 118/78 | HR 75 | Temp 98.4°F | Resp 16 | Wt 162.0 lb

## 2017-08-15 DIAGNOSIS — J45909 Unspecified asthma, uncomplicated: Secondary | ICD-10-CM | POA: Insufficient documentation

## 2017-08-15 DIAGNOSIS — Z20828 Contact with and (suspected) exposure to other viral communicable diseases: Secondary | ICD-10-CM | POA: Insufficient documentation

## 2017-08-15 DIAGNOSIS — Z79899 Other long term (current) drug therapy: Secondary | ICD-10-CM | POA: Insufficient documentation

## 2017-08-15 DIAGNOSIS — Z87440 Personal history of urinary (tract) infections: Secondary | ICD-10-CM | POA: Insufficient documentation

## 2017-08-15 NOTE — Progress Notes (Signed)
Pt has concerns about test for oral herpes

## 2017-08-15 NOTE — Progress Notes (Signed)
Patient ID: Eileen [name], female   DOB: [birth year], 46 y.o.   MRN: [id number]   Eileen [name], is a 46 y.o. female  WPY:[id number]  KNL:[id number]  DOB - 1970/10/04  Subjective:  Chief Complaint and HPI: Eileen [name] is a 46 y.o. female here today to get a blood test for herpes.  She says a family member tested positive for herpes and she needs to get tested.  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  She doesn't have a cold sore currently.  She denies any genitalia lesions now or ever.  No f/c.  She doesn't feel sick.    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    ROS:   Constitutional:  No f/c, No night sweats, No unexplained weight loss. EENT:  No vision changes, No blurry vision, No hearing changes. No mouth, throat, or ear problems.  Respiratory: No cough, No SOB Cardiac: No CP, no palpitations GI:  No abd pain, No N/V/D. GU: No Urinary s/sx Musculoskeletal: No joint pain Neuro: No headache, no dizziness, no motor weakness.  Skin: No rash Endocrine:  No polydipsia. No polyuria.  Psych: Denies SI/HI  No problems updated.  ALLERGIES: No Known Allergies  PAST MEDICAL HISTORY: Past Medical History:  Diagnosis Date  . Arthritis   . Asthma   . CHF (congestive heart failure) ([place])    "unsure thinks lungs had fluid around after surgery 7 years ago"  . Chronic kidney disease    kidney infection  . Neuromuscular disorder ([place])    HERNIA  . Right inguinal hernia   . Shortness of breath     MEDICATIONS AT HOME: Prior to Admission medications   Medication Sig Start Date End Date Taking? Authorizing Provider  acetaminophen (TYLENOL) 325 MG tablet Take 650 mg by mouth every 6 (six) hours as needed for mild pain.    [provider]  clotrimazole (LOTRIMIN) 1 % cream Apply 1 application topically 2 (two) times daily. Patient not taking: Reported on 06/24/2017 06/12/17   Arnoldo Morale, MD  ibuprofen (ADVIL,MOTRIN) 600 MG tablet Take 1 tablet (600  mg total) by mouth every 8 (eight) hours as needed. Patient not taking: Reported on 04/27/2017 03/21/17   Argentina Donovan, PA-C  Phenyleph-Doxylamine-DM-APAP (VICKS DAYQUIL/NYQUIL CLD & FLU PO) Take 1 capsule by mouth every 4 (four) hours as needed (cough).    [provider]  triamcinolone ointment (KENALOG) 0.5 % Apply 1 application topically 2 (two) times daily. To vulvar area as needed for rash for 3-5 days Patient not taking: Reported on 04/27/2017 03/21/17   Argentina Donovan, PA-C  VENTOLIN HFA 108 (90 Base) MCG/ACT inhaler INHALE 2 PUFFS EVERY 4 HOURS AS NEEDED FOR WHEEZING OR SHORTNESS OF BREATH 03/21/17   Funches, Adriana Mccallum, MD     Objective:  EXAM:   Vitals:   08/15/17 1326  BP: 118/78  Pulse: 75  Resp: 16  Temp: 98.4 F (36.9 C)  TempSrc: Oral  SpO2: 99%  Weight: 162 lb (73.5 kg)    General appearance : A&OX3. NAD. Non-toxic-appearing HEENT: Atraumatic and Normocephalic.  PERRLA. EOM intact.  TM clear B. Mouth-MMM, post pharynx WNL w/o erythema, No PND. Neck: supple, no JVD. No cervical lymphadenopathy. No thyromegaly Chest/Lungs:  Breathing-non-labored, Good air entry bilaterally, breath sounds normal without rales, rhonchi, or wheezing  CVS: S1 S2 regular, no murmurs, gallops, rubs  Extremities: Bilateral Lower Ext shows no edema, both legs are warm to touch with = pulse throughout Neurology:  CN II-XII grossly intact, Non focal.  Psych:  TP linear. J/I WNL. Normal speech. Appropriate eye contact and affect.  Skin:  No Rash  Data Review Lab Results  Component Value Date   HGBA1C 5.5 11/18/2015     Assessment & Plan   1. Possible Exposure to herpes.  No lesions.  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   - HSV(herpes simplex vrs) 1+2 ab-IgG  Patient have been counseled extensively about nutrition and exercise  Return if symptoms worsen or fail to improve.  The patient was given clear instructions to go to ER or return to medical center if symptoms don't  improve, worsen or new problems develop. The patient verbalized understanding. The patient was told to call to get lab results if they haven't heard anything in the next week.     Freeman Caldron, PA-C [facility] and [facility], [place]   08/15/2017, 1:41 PM

## 2017-08-17 LAB — HSV(HERPES SIMPLEX VRS) I + II AB-IGG
HSV 1 GLYCOPROTEIN G AB, IGG: 19.7 {index} — AB (ref 0.00–0.90)
HSV 2 IgG, Type Spec: <0.91 index (ref 0.00–0.90)

## 2017-08-24 ENCOUNTER — Telehealth: Payer: Self-pay | Admitting: Family Medicine

## 2017-08-24 NOTE — Telephone Encounter (Signed)
Pt name and DOB verified. Pt aware of results for lab test and result message.

## 2017-08-24 NOTE — Telephone Encounter (Signed)
Pt called returning the call from the nurse, please call her back, spanish speaker pt, please follow up

## 2017-08-24 NOTE — Telephone Encounter (Signed)
Eileen Chester, RN        Please call patient. She did NOT test positive for genital herpes virus. She DID test positive for the type that causes cold sores of the mouth as we discussed at her office visit. A high percentage of people test positive for this type of herpes virus. If she has any sexual partner that has genital herpes, 1)sex should be avoided during outbreaks, 2)condoms should be used with all sexual encounters, and 3- there are medications that can be taken to reduce transmission by both partners.  Thanks,  Freeman Caldron, PA-C    Unable to reach patient, Left message on voicemail  by assist of Interpreter: Burman Nieves [phone]

## 2018-06-14 IMAGING — CR DG CHEST 2V
2 series · 2 of 2 positions shown · non-contrast
Comparison: None.

CLINICAL DATA: Productive cough, shortness breath, and chest pain
for 2 days.

EXAM:
CHEST  2 VIEW

[chest pa]
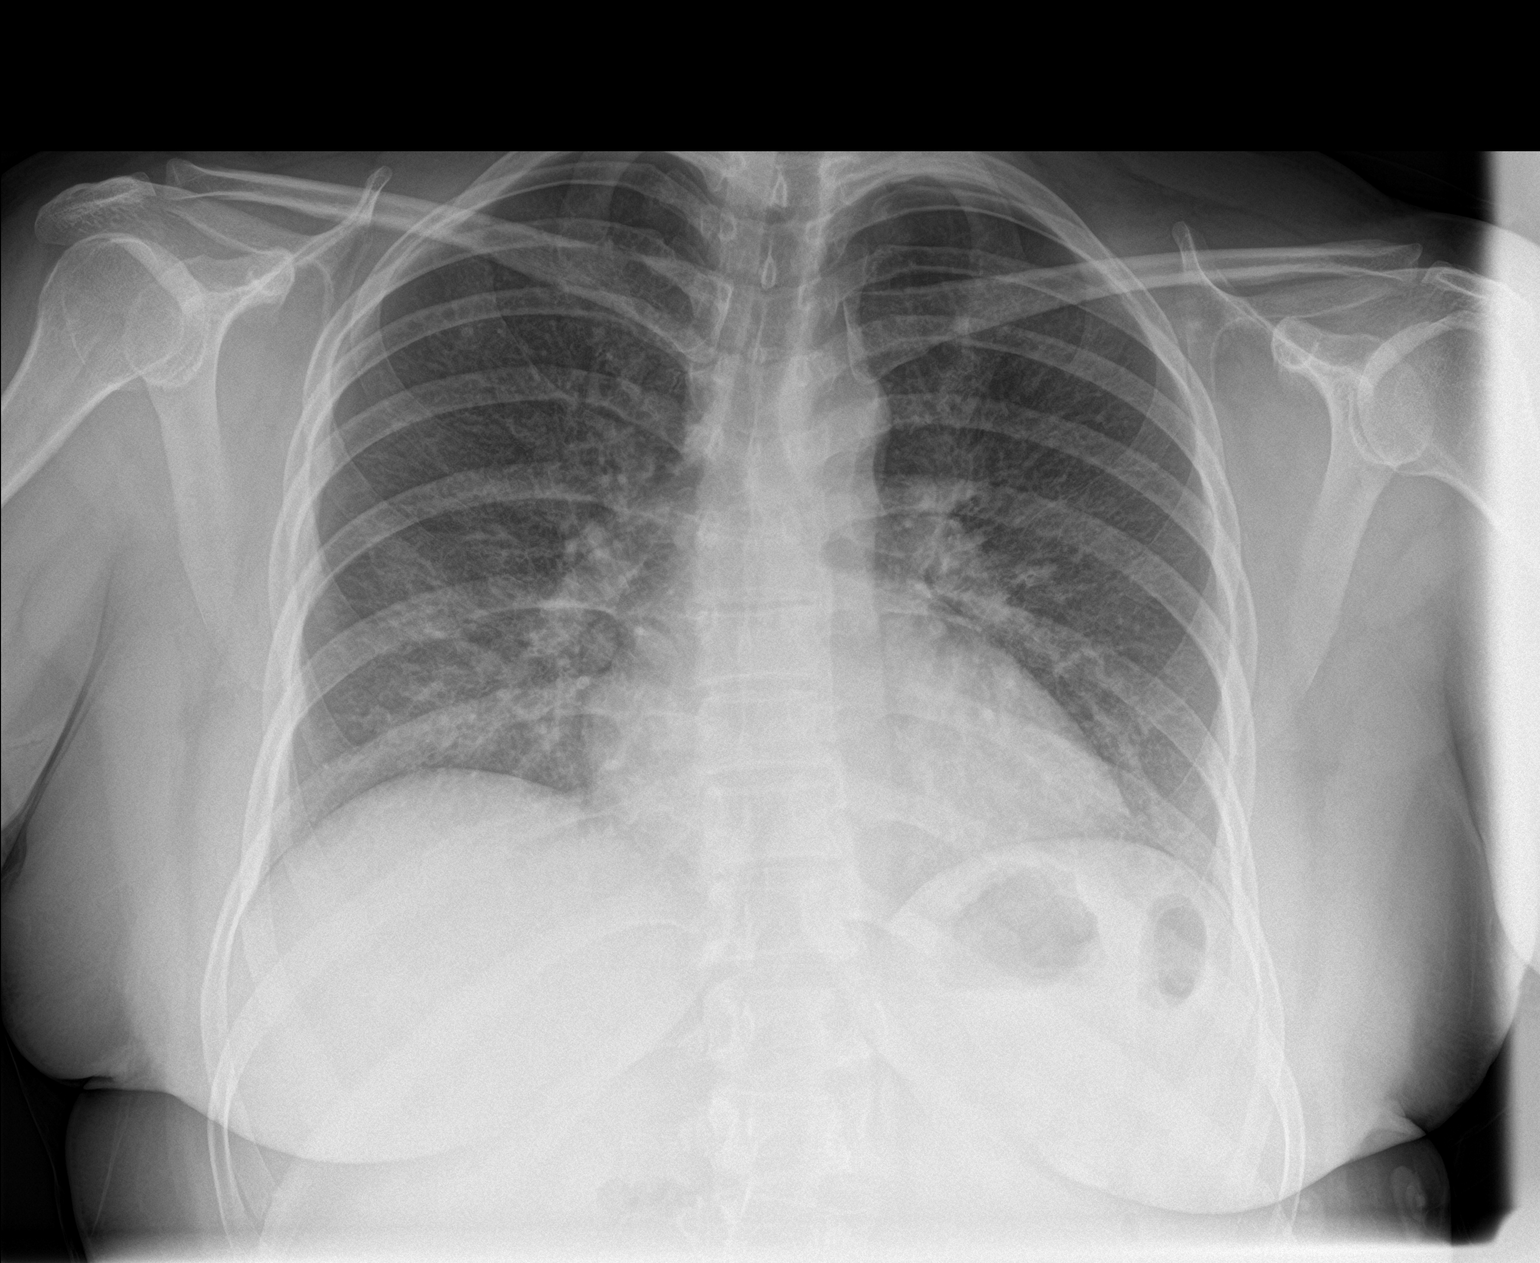

[chest lat]
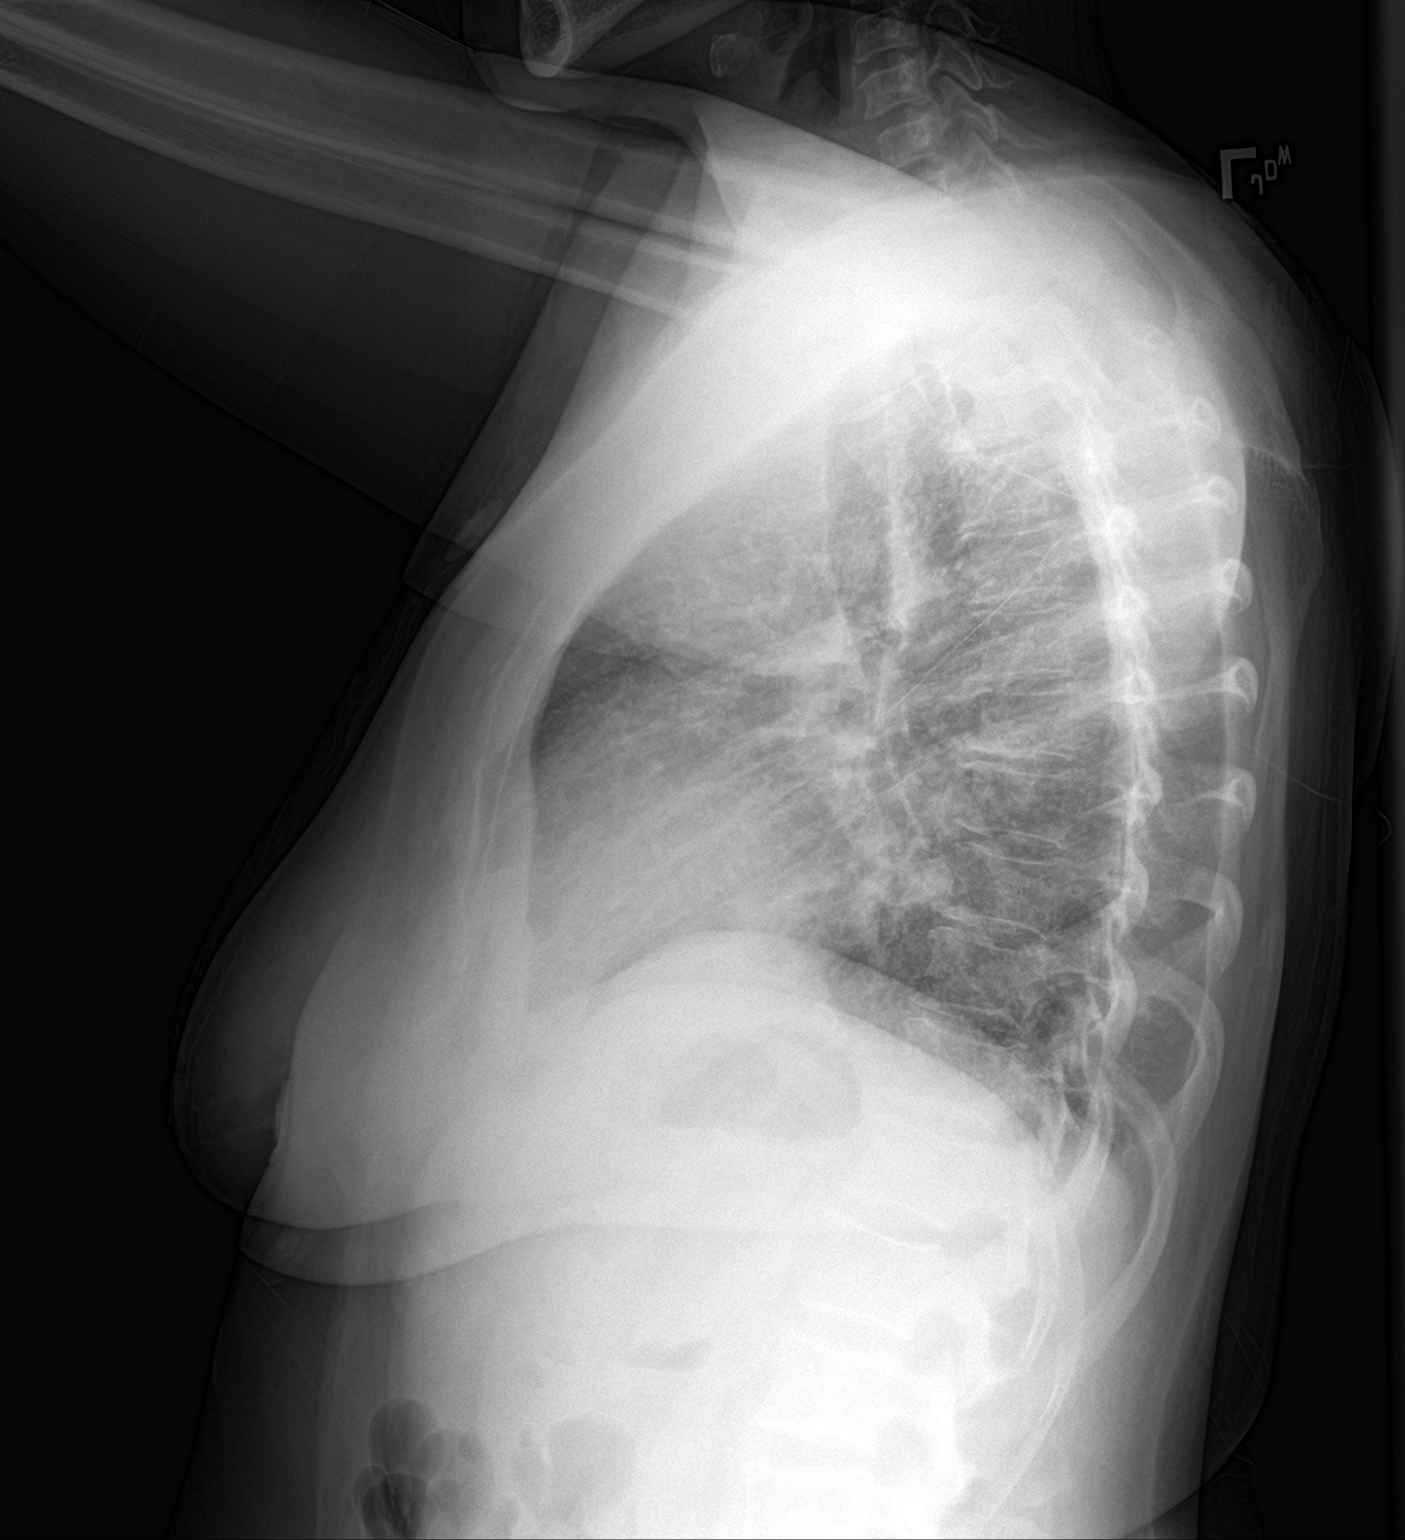

[2 of 2 positions shown; findings below may reference images not displayed]

FINDINGS: The heart size and mediastinal contours are within normal limits.
Low lung volumes are noted. No evidence of pulmonary infiltrate or
pleural effusion.
IMPRESSION: Low lung volumes.  No active disease.

## 2018-09-06 ENCOUNTER — Encounter: Payer: Self-pay | Admitting: Nurse Practitioner

## 2018-09-06 ENCOUNTER — Ambulatory Visit: Payer: Self-pay | Attending: Nurse Practitioner | Admitting: Nurse Practitioner

## 2018-09-06 VITALS — BP 103/65 | HR 68 | Temp 97.8°F | Wt 149.2 lb

## 2018-09-06 DIAGNOSIS — M25531 Pain in right wrist: Secondary | ICD-10-CM | POA: Insufficient documentation

## 2018-09-06 DIAGNOSIS — N920 Excessive and frequent menstruation with regular cycle: Secondary | ICD-10-CM | POA: Insufficient documentation

## 2018-09-06 DIAGNOSIS — M25561 Pain in right knee: Secondary | ICD-10-CM | POA: Insufficient documentation

## 2018-09-06 DIAGNOSIS — N921 Excessive and frequent menstruation with irregular cycle: Secondary | ICD-10-CM | POA: Insufficient documentation

## 2018-09-06 DIAGNOSIS — M1990 Unspecified osteoarthritis, unspecified site: Secondary | ICD-10-CM | POA: Insufficient documentation

## 2018-09-06 DIAGNOSIS — J45909 Unspecified asthma, uncomplicated: Secondary | ICD-10-CM | POA: Insufficient documentation

## 2018-09-06 DIAGNOSIS — N189 Chronic kidney disease, unspecified: Secondary | ICD-10-CM | POA: Insufficient documentation

## 2018-09-06 DIAGNOSIS — G709 Myoneural disorder, unspecified: Secondary | ICD-10-CM | POA: Insufficient documentation

## 2018-09-06 DIAGNOSIS — G8929 Other chronic pain: Secondary | ICD-10-CM | POA: Insufficient documentation

## 2018-09-06 DIAGNOSIS — Z79899 Other long term (current) drug therapy: Secondary | ICD-10-CM | POA: Insufficient documentation

## 2018-09-06 DIAGNOSIS — N938 Other specified abnormal uterine and vaginal bleeding: Secondary | ICD-10-CM

## 2018-09-06 DIAGNOSIS — Z3202 Encounter for pregnancy test, result negative: Secondary | ICD-10-CM

## 2018-09-06 DIAGNOSIS — I509 Heart failure, unspecified: Secondary | ICD-10-CM | POA: Insufficient documentation

## 2018-09-06 DIAGNOSIS — N939 Abnormal uterine and vaginal bleeding, unspecified: Secondary | ICD-10-CM | POA: Insufficient documentation

## 2018-09-06 LAB — POCT URINE PREGNANCY: Preg Test, Ur: NEGATIVE

## 2018-09-06 MED ORDER — IBUPROFEN 600 MG PO TABS: 600.0000 mg | ORAL_TABLET | Freq: Three times a day (TID) | ORAL | 0 refills | Status: DC | PRN

## 2018-09-06 MED ORDER — MEDROXYPROGESTERONE ACETATE 10 MG PO TABS: 10.0000 mg | ORAL_TABLET | Freq: Every day | ORAL | 1 refills | Status: DC

## 2018-09-06 MED FILL — MEDROXYPROGESTERONE 10 MG T: 10 | 10 days supply | Qty: 10 | Fill #0

## 2018-09-06 MED FILL — IBUPROFEN 600 MG TABLET: 600 | 20 days supply | Qty: 60 | Fill #0

## 2018-09-06 NOTE — Progress Notes (Signed)
Patient is having more than one period a month.  Patient is having pain in knee and hand.

## 2018-09-06 NOTE — Progress Notes (Signed)
Assessment & Plan:  Eileen [name] was seen today for menstrual problem.  Diagnoses and all orders for this visit:  Abnormal uterine bleeding (AUB) -     CBC -     CMP14+EGFR -     TSH -     POCT urine pregnancy -     medroxyPROGESTERone (PROVERA) 10 MG tablet; Take 1 tablet (10 mg total) by mouth daily for 10 days.  Right wrist pain -     ibuprofen (ADVIL,MOTRIN) 600 MG tablet; Take 1 tablet (600 mg total) by mouth every 8 (eight) hours as needed. She was also given a right wrist splint here in the office today to wear for the next 6 weeks.   Chronic pain of right knee -     ibuprofen (ADVIL,MOTRIN) 600 MG tablet; Take 1 tablet (600 mg total) by mouth every 8 (eight) hours as needed.    Patient has been counseled on age-appropriate routine health concerns for screening and prevention. These are reviewed and up-to-date. Referrals have been placed accordingly. Immunizations are up-to-date or declined.    Subjective:   Chief Complaint  Patient presents with  . Menstrual Problem   HPI Eileen [name] 48 y.o. female presents to office today to establish care. VRI was used to communicate directly with patient for the entire encounter including providing detailed patient instructions. She reports a history of arthritis (Generalized), asthma, CHF (thinks she was told she had fluid around her lungs years ago), and CKD from a pyelonephritis  AUB She has complaints of metrorrhagia. Endorses having her period every 15 days. She can not remember when the metrorrhagia started however she denies any PMH of AUB. She endorses menorrhagia as well. She is sexually active with one partner. Denies any highrisk  STD exposure.  She is currently experiencing a very light menstrual flow. She will need blood work, UPT, and possibly [place] and GYN referral. Patient was urged to apply for the financial assistance program.  They were instructed to inquire at the front desk about the application process for the [facility]  [employer] discount, orange card or other financial assistance.   Will initiate provera today however she has been instructed that this medication will not be long term and we will need to ascertain the source of her irregular bleeding with further testing.    Arthralgia Right knee pain for months.  Aggravating factors: Hyperflexion and weight bearing. Most of the pain occurs in the lateral knee area. She denies any trauma or injury. She has not taken any medications for her pain nor does she wear a knee brace or sleeve. She denies any swelling, popping sensation, giving out or crepitus of the knee.     Carpal Tunnel Syndrome: Patient presents for presents evaluation of possible carpal tunnel syndrome.  Onset of the symptoms was "many years" ago. Current symptoms include pain, numbness, burning and tingling involving the right 3-5th fingers aspect of the right hand, of moderate severity and pain involving the right wrist. Inciting event/aggravating factors: repetitive activity: bending, twisting, inversion Patient's course of OZ:HYQMVHQI have progressed to a point and plateaued. Evaluation to date: none.  Treatment to date: none.    Review of Systems  Constitutional: Negative for fever, malaise/fatigue and weight loss.  HENT: Negative.  Negative for nosebleeds.   Eyes: Negative.  Negative for blurred vision, double vision and photophobia.  Respiratory: Negative.  Negative for cough and shortness of breath.   Cardiovascular: Negative.  Negative for chest pain, palpitations and leg swelling.  Gastrointestinal: Negative.  Negative for heartburn, nausea and vomiting.  Genitourinary:       SEE HPI  Musculoskeletal: Positive for joint pain. Negative for myalgias.       SEE HPI  Neurological: Negative.  Negative for dizziness, focal weakness, seizures and headaches.  Psychiatric/Behavioral: Negative.  Negative for suicidal ideas.    Past Medical History:  Diagnosis Date  . Arthritis   . Asthma   .  CHF (congestive heart failure) ([place])    "unsure thinks lungs had fluid around after surgery 7 years ago"  . Chronic kidney disease    kidney infection  . Neuromuscular disorder ([place])    HERNIA  . Right inguinal hernia   . Shortness of breath     Past Surgical History:  Procedure Laterality Date  . CESAREAN SECTION     x2  . CESAREAN SECTION    . HERNIA REPAIR  09/12/11   RIH  . HERNIA REPAIR    . INGUINAL HERNIA REPAIR  09/12/2011   Procedure: HERNIA REPAIR INGUINAL ADULT;  Surgeon: Gayland Curry, MD;  Location: [facility];  Service: General;  Laterality: Right;  open right inguinal hernia with mesh   . TUBAL LIGATION      Family History  Problem Relation Age of Onset  . Ulcers Father        stomach  . Diabetes Brother   . Colon cancer Neg Hx   . Rectal cancer Neg Hx   . Stomach cancer Neg Hx     Social History Reviewed with no changes to be made today.   Outpatient Medications Prior to Visit  Medication Sig Dispense Refill  . VENTOLIN HFA 108 (90 Base) MCG/ACT inhaler INHALE 2 PUFFS EVERY 4 HOURS AS NEEDED FOR WHEEZING OR SHORTNESS OF BREATH 18 g 1  . acetaminophen (TYLENOL) 325 MG tablet Take 650 mg by mouth every 6 (six) hours as needed for mild pain.    . clotrimazole (LOTRIMIN) 1 % cream Apply 1 application topically 2 (two) times daily. (Patient not taking: Reported on 06/24/2017) 30 g 1  . ibuprofen (ADVIL,MOTRIN) 600 MG tablet Take 1 tablet (600 mg total) by mouth every 8 (eight) hours as needed. (Patient not taking: Reported on 04/27/2017) 60 tablet 0  . Phenyleph-Doxylamine-DM-APAP (VICKS DAYQUIL/NYQUIL CLD & FLU PO) Take 1 capsule by mouth every 4 (four) hours as needed (cough).    . triamcinolone ointment (KENALOG) 0.5 % Apply 1 application topically 2 (two) times daily. To vulvar area as needed for rash for 3-5 days (Patient not taking: Reported on 04/27/2017) 15 g 2   No facility-administered medications prior to visit.     No Known Allergies     Objective:      BP 103/65   Pulse 68   Temp 97.8 F (36.6 C) (Oral)   Wt 149 lb 3.2 oz (67.7 kg)   SpO2 99%   BMI 27.29 kg/m  Wt Readings from Last 3 Encounters:  09/06/18 149 lb 3.2 oz (67.7 kg)  08/15/17 162 lb (73.5 kg)  06/24/17 160 lb (72.6 kg)    Physical Exam Vitals signs and nursing note reviewed.  Constitutional:      Appearance: She is well-developed.  HENT:     Head: Normocephalic and atraumatic.  Neck:     Musculoskeletal: Normal range of motion.  Cardiovascular:     Rate and Rhythm: Normal rate and regular rhythm.     Heart sounds: Normal heart sounds. No murmur. No friction rub. No  gallop.   Pulmonary:     Effort: Pulmonary effort is normal. No tachypnea or respiratory distress.     Breath sounds: Normal breath sounds. No decreased breath sounds, wheezing, rhonchi or rales.  Chest:     Chest wall: No tenderness.  Abdominal:     General: Bowel sounds are normal.     Palpations: Abdomen is soft.  Musculoskeletal:     Right wrist: She exhibits tenderness. She exhibits normal range of motion, no bony tenderness and no swelling.     Right knee: She exhibits normal range of motion, no swelling, no effusion, no deformity, no laceration and normal alignment. Tenderness found. Lateral joint line tenderness noted.     Right hand: She exhibits normal range of motion and no tenderness.       Hands:  Skin:    General: Skin is warm and dry.  Neurological:     Mental Status: She is alert and oriented to person, place, and time.     Coordination: Coordination normal.  Psychiatric:        Behavior: Behavior normal. Behavior is cooperative.        Thought Content: Thought content normal.        Judgment: Judgment normal.          Patient has been counseled extensively about nutrition and exercise as well as the importance of adherence with medications and regular follow-up. The patient was given clear instructions to go to ER or return to medical center if symptoms don't improve,  worsen or new problems develop. The patient verbalized understanding.   Follow-up: Return for  see me in 6 weeks;  Needs appointment with financial representative.Gildardo Pounds, FNP-BC [facility] and [facility] [place], [place]   09/06/2018, 2:12 PM

## 2018-09-06 NOTE — Addendum Note (Signed)
Addended by: Trecia Rogers on: 09/06/2018 05:19 PM   Modules accepted: Orders

## 2018-09-07 LAB — CMP14+EGFR
ALT: 12 IU/L (ref 0–32)
AST: 10 IU/L (ref 0–40)
Albumin/Globulin Ratio: 2 (ref 1.2–2.2)
Albumin: 4.2 g/dL (ref 3.5–5.5)
Alkaline Phosphatase: 73 IU/L (ref 39–117)
BILIRUBIN TOTAL: 0.3 mg/dL (ref 0.0–1.2)
BUN/Creatinine Ratio: 14 (ref 9–23)
BUN: 12 mg/dL (ref 6–24)
CHLORIDE: 103 mmol/L (ref 96–106)
CO2: 22 mmol/L (ref 20–29)
Calcium: 9.4 mg/dL (ref 8.7–10.2)
Creatinine, Ser: 0.84 mg/dL (ref 0.57–1.00)
GFR calc Af Amer: 96 mL/min/{1.73_m2} (ref >59)
GFR calc non Af Amer: 84 mL/min/{1.73_m2} (ref >59)
Globulin, Total: 2.1 g/dL (ref 1.5–4.5)
Glucose: 87 mg/dL (ref 65–99)
Potassium: 4.5 mmol/L (ref 3.5–5.2)
Sodium: 139 mmol/L (ref 134–144)
Total Protein: 6.3 g/dL (ref 6.0–8.5)

## 2018-09-07 LAB — CBC
Hematocrit: 40.2 % (ref 34.0–46.6)
Hemoglobin: 13.7 g/dL (ref 11.1–15.9)
MCH: 31.5 pg (ref 26.6–33.0)
MCHC: 34.1 g/dL (ref 31.5–35.7)
MCV: 92 fL (ref 79–97)
PLATELETS: 304 10*3/uL (ref 150–450)
RBC: 4.35 x10E6/uL (ref 3.77–5.28)
RDW: 13.1 % (ref 11.7–15.4)
WBC: 9.1 10*3/uL (ref 3.4–10.8)

## 2018-09-07 LAB — TSH: TSH: 1.6 u[IU]/mL (ref 0.450–4.500)

## 2018-09-10 ENCOUNTER — Telehealth (INDEPENDENT_AMBULATORY_CARE_PROVIDER_SITE_OTHER): Payer: Self-pay

## 2018-09-10 NOTE — Telephone Encounter (Signed)
-----   Message from Gildardo Pounds, NP sent at 09/08/2018  8:14 PM EST ----- All of your labs are normal. There is no anemia. Kidney and liver function are normal as well as your thyroid level.

## 2018-09-10 NOTE — Telephone Encounter (Signed)
Call placed using [employer] interpreter [phone]) patient is aware that labs are normal. No anemia. Kidney, liver and thyroid normal. Nat Christen, CMA  .

## 2018-09-20 ENCOUNTER — Ambulatory Visit: Payer: Self-pay | Attending: Family Medicine

## 2018-10-19 ENCOUNTER — Emergency Department (HOSPITAL_COMMUNITY)
Admission: EM | Admit: 2018-10-19 | Discharge: 2018-10-20 | Disposition: A | Discharge status: Home / Self Care | Payer: Self-pay | Attending: Emergency Medicine | Admitting: Emergency Medicine

## 2018-10-19 ENCOUNTER — Other Ambulatory Visit: Payer: Self-pay

## 2018-10-19 ENCOUNTER — Emergency Department (HOSPITAL_COMMUNITY): Payer: Self-pay

## 2018-10-19 DIAGNOSIS — J189 Pneumonia, unspecified organism: Secondary | ICD-10-CM

## 2018-10-19 DIAGNOSIS — J45909 Unspecified asthma, uncomplicated: Secondary | ICD-10-CM | POA: Insufficient documentation

## 2018-10-19 DIAGNOSIS — F17210 Nicotine dependence, cigarettes, uncomplicated: Secondary | ICD-10-CM | POA: Insufficient documentation

## 2018-10-19 DIAGNOSIS — J181 Lobar pneumonia, unspecified organism: Secondary | ICD-10-CM | POA: Insufficient documentation

## 2018-10-19 DIAGNOSIS — Z79899 Other long term (current) drug therapy: Secondary | ICD-10-CM | POA: Insufficient documentation

## 2018-10-19 LAB — URINALYSIS, ROUTINE W REFLEX MICROSCOPIC
BACTERIA UA: NONE SEEN
BILIRUBIN URINE: NEGATIVE
GLUCOSE, UA: NEGATIVE mg/dL
Ketones, ur: 20 mg/dL — AB
LEUKOCYTE UA: NEGATIVE
NITRITE: NEGATIVE
PROTEIN: NEGATIVE mg/dL
Specific Gravity, Urine: 1.018 (ref 1.005–1.030)
pH: 5 (ref 5.0–8.0)

## 2018-10-19 LAB — COMPREHENSIVE METABOLIC PANEL
ALT: 17 U/L (ref 0–44)
ANION GAP: 8 (ref 5–15)
AST: 16 U/L (ref 15–41)
Albumin: 3.6 g/dL (ref 3.5–5.0)
Alkaline Phosphatase: 63 U/L (ref 38–126)
BUN: 10 mg/dL (ref 6–20)
CHLORIDE: 109 mmol/L (ref 98–111)
CO2: 20 mmol/L — AB (ref 22–32)
CREATININE: 0.72 mg/dL (ref 0.44–1.00)
Calcium: 8.7 mg/dL — ABNORMAL LOW (ref 8.9–10.3)
Glucose, Bld: 110 mg/dL — ABNORMAL HIGH (ref 70–99)
POTASSIUM: 3.5 mmol/L (ref 3.5–5.1)
SODIUM: 137 mmol/L (ref 135–145)
Total Bilirubin: 0.4 mg/dL (ref 0.3–1.2)
Total Protein: 6.7 g/dL (ref 6.5–8.1)

## 2018-10-19 LAB — CBC
HEMATOCRIT: 39.3 % (ref 36.0–46.0)
Hemoglobin: 13.3 g/dL (ref 12.0–15.0)
MCH: 30.9 pg (ref 26.0–34.0)
MCHC: 33.8 g/dL (ref 30.0–36.0)
MCV: 91.2 fL (ref 80.0–100.0)
NRBC: 0 % (ref 0.0–0.2)
Platelets: 214 10*3/uL (ref 150–400)
RBC: 4.31 MIL/uL (ref 3.87–5.11)
RDW: 14.3 % (ref 11.5–15.5)
WBC: 6 10*3/uL (ref 4.0–10.5)

## 2018-10-19 LAB — LIPASE, BLOOD: LIPASE: 22 U/L (ref 11–51)

## 2018-10-19 LAB — I-STAT BETA HCG BLOOD, ED (MC, WL, AP ONLY)

## 2018-10-19 MED ORDER — ACETAMINOPHEN 500 MG PO TABS
1000.0000 mg | ORAL_TABLET | Freq: Once | ORAL | Status: AC
  Administered 2018-10-19: 1000 mg via ORAL
  Filled 2018-10-19: qty 2

## 2018-10-19 MED ORDER — SODIUM CHLORIDE 0.9% FLUSH: 3.0000 mL | Freq: Once | INTRAVENOUS | Status: DC

## 2018-10-19 MED ORDER — LEVOFLOXACIN 750 MG PO TABS: 750.0000 mg | ORAL_TABLET | Freq: Every day | ORAL | 0 refills | Status: DC

## 2018-10-19 MED ORDER — LEVOFLOXACIN IN D5W 750 MG/150ML IV SOLN
750.0000 mg | Freq: Once | INTRAVENOUS | Status: AC
  Administered 2018-10-19: 750 mg via INTRAVENOUS
  Filled 2018-10-19: qty 150

## 2018-10-19 MED ORDER — SODIUM CHLORIDE 0.9 % IV BOLUS
1000.0000 mL | Freq: Once | INTRAVENOUS | Status: AC
  Administered 2018-10-19: 1000 mL via INTRAVENOUS

## 2018-10-19 NOTE — ED Triage Notes (Signed)
Pt reports cough, fever and vomiting X2 days. Pt denies diarrhea.

## 2018-10-19 NOTE — ED Provider Notes (Signed)
[facility] EMERGENCY DEPARTMENT Provider Note   CSN: [id number] Arrival date & time: 10/19/18  1847    History   Chief Complaint Chief Complaint  Patient presents with  . Fever    HPI Eileen [name] is a 48 y.o. female history of CHF, CKD, here presenting with fever, cough, vomiting.  Patient states that she has been running a fever since yesterday.  T-max was 101.  Patient also has been having a productive cough as well as some vomiting.  Patient denies any urinary symptoms currently.  Patient denies any recent travels.     The history is provided by the patient.    Past Medical History:  Diagnosis Date  . Arthritis   . Asthma   . CHF (congestive heart failure) ([place])    "unsure thinks lungs had fluid around after surgery 7 years ago"  . Chronic kidney disease    kidney infection  . Neuromuscular disorder ([place])    HERNIA  . Right inguinal hernia   . Shortness of breath     Patient Active Problem List   Diagnosis Date Noted  . Bilateral low back pain without sciatica 06/06/2016  . 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 Asthma 09/24/2014  . History of adenomatous polyp of colon 11/07/2013  . Chronic RLQ pain 11/07/2013  . 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 Current smoker 09/26/2013    Past Surgical History:  Procedure Laterality Date  . CESAREAN SECTION     x2  . CESAREAN SECTION    . HERNIA REPAIR  09/12/11   RIH  . HERNIA REPAIR    . INGUINAL HERNIA REPAIR  09/12/2011   Procedure: HERNIA REPAIR INGUINAL ADULT;  Surgeon: Gayland Curry, MD;  Location: [facility];  Service: General;  Laterality: Right;  open right inguinal hernia with mesh   . TUBAL LIGATION       OB History    Gravida  5   Para  4   Term  4   Preterm  0   AB  1   Living        SAB  1   TAB  0   Ectopic  0   Multiple      Live Births               Home Medications    Prior to Admission medications   Medication Sig Start Date End Date  Taking? Authorizing Provider  ibuprofen (ADVIL,MOTRIN) 600 MG tablet Take 1 tablet (600 mg total) by mouth every 8 (eight) hours as needed. Patient not taking: Reported on 10/19/2018 09/06/18   Gildardo Pounds, NP  medroxyPROGESTERone (PROVERA) 10 MG tablet Take 1 tablet (10 mg total) by mouth daily for 10 days. Patient not taking: Reported on 10/19/2018 09/06/18 10/20/27  Gildardo Pounds, NP    Family History Family History  Problem Relation Age of Onset  . Ulcers Father        stomach  . Diabetes Brother   . Colon cancer Neg Hx   . Rectal cancer Neg Hx   . Stomach cancer Neg Hx     Social History Social History   Tobacco Use  . Smoking status: Current Every Day Smoker    Packs/day: 0.50    Years: 20.00    Pack years: 10.00    Types: Cigarettes  . Smokeless tobacco: Never Used  . Tobacco comment: 3-8 CIGS A DAY  Substance Use Topics  . Alcohol use: No  .  Drug use: No     Allergies   Patient has no known allergies.   Review of Systems Review of Systems  Constitutional: Positive for fever.  Respiratory: Positive for cough.   All other systems reviewed and are negative.    Physical Exam Updated Vital Signs BP 92/70 (BP Location: Right Arm)   Pulse 84   Temp 100 F (37.8 C) (Oral)   Resp 14   Ht 5' (1.524 m)   Wt 63.5 kg   SpO2 95%   BMI 27.34 kg/m   Physical Exam Vitals signs and nursing note reviewed.  Constitutional:      Appearance: Normal appearance.  HENT:     Head: Normocephalic.     Right Ear: Tympanic membrane normal.     Left Ear: Tympanic membrane normal.     Mouth/Throat:     Mouth: Mucous membranes are moist.  Eyes:     Extraocular Movements: Extraocular movements intact.     Pupils: Pupils are equal, round, and reactive to light.  Neck:     Musculoskeletal: Normal range of motion.  Cardiovascular:     Rate and Rhythm: Normal rate and regular rhythm.     Pulses: Normal pulses.     Heart sounds: Normal heart sounds.  Pulmonary:      Effort: Pulmonary effort is normal.     Comments: Crackles L base  Abdominal:     General: Abdomen is flat.     Palpations: Abdomen is soft.  Musculoskeletal: Normal range of motion.  Skin:    General: Skin is warm.     Capillary Refill: Capillary refill takes less than 2 seconds.  Neurological:     General: No focal deficit present.     Mental Status: She is alert and oriented to person, place, and time.  Psychiatric:        Mood and Affect: Mood normal.        Behavior: Behavior normal.      ED Treatments / Results  Labs (all labs ordered are listed, but only abnormal results are displayed) Labs Reviewed  COMPREHENSIVE METABOLIC PANEL - Abnormal; Notable for the following components:      Result Value   CO2 20 (*)    Glucose, Bld 110 (*)    Calcium 8.7 (*)    All other components within normal limits  LIPASE, BLOOD  CBC  URINALYSIS, ROUTINE W REFLEX MICROSCOPIC  I-STAT BETA HCG BLOOD, ED ([facility], [facility], [facility] ONLY)    EKG None  Radiology Dg Chest 2 View  Result Date: 10/19/2018 CLINICAL DATA:  Fever, cough, vomiting EXAM: CHEST - 2 VIEW COMPARISON:  06/24/2017 FINDINGS: Consolidation noted in the left lower lung compatible with pneumonia. Mild peribronchial thickening. Heart is normal size. No effusions or acute bony abnormality. IMPRESSION: Bronchitic changes. Focal airspace opacity at the left lung base concerning for pneumonia. Electronically Signed   By: Rolm Baptise M.D.   On: 10/19/2018 22:04    Procedures Procedures (including critical care time)  Medications Ordered in ED Medications  sodium chloride flush (NS) 0.9 % injection 3 mL (3 mLs Intravenous Not Given 10/19/18 2132)  sodium chloride 0.9 % bolus 1,000 mL (1,000 mLs Intravenous New Bag/Given 10/19/18 2304)  levofloxacin (LEVAQUIN) IVPB 750 mg (750 mg Intravenous New Bag/Given 10/19/18 2307)  sodium chloride 0.9 % bolus 1,000 mL (1,000 mLs Intravenous New Bag/Given 10/19/18 2131)  acetaminophen (TYLENOL) tablet  1,000 mg (1,000 mg Oral Given 10/19/18 2110)     Initial Impression /  Assessment and Plan / ED Course  I have reviewed the triage vital signs and the nursing notes.  Pertinent labs & imaging results that were available during my care of the patient were reviewed by me and considered in my medical decision making (see chart for details).       Eileen [name] is a 48 y.o. female here with cough, fever. Consider pneumonia vs pyelo vs viral syndrome vs gastroenteritis. Will get labs, CXR. Will hydrate and reassess.   11:23 PM  Labs unremarkable. CXR showed pneumonia. Patient not hypoxic. Updated patient with Patent attorney. Given levaquin, stable for discharge.    Final Clinical Impressions(s) / ED Diagnoses   Final diagnoses:  None    ED Discharge Orders    None       Drenda Freeze, MD 10/19/18 2324

## 2018-10-19 NOTE — ED Notes (Signed)
Pt aware of need for urine sample. Unable to provide at this time.

## 2018-10-19 NOTE — ED Notes (Signed)
Pt provided labeled specimen cup; asked to collect sample for U/A when possible. [employer]

## 2018-10-19 NOTE — Discharge Instructions (Signed)
Take levaquin daily for a week.   Stay hydrated   See your doctor  Return to ER if you have worse cough, fever, trouble breathing

## 2018-10-22 ENCOUNTER — Encounter: Payer: Self-pay | Admitting: Family Medicine

## 2018-10-22 ENCOUNTER — Ambulatory Visit: Payer: Self-pay | Attending: Family Medicine | Admitting: Family Medicine

## 2018-10-22 VITALS — BP 130/76 | HR 70 | Temp 98.7°F | Wt 153.0 lb

## 2018-10-22 DIAGNOSIS — Z1239 Encounter for other screening for malignant neoplasm of breast: Secondary | ICD-10-CM

## 2018-10-22 DIAGNOSIS — Z0000 Encounter for general adult medical examination without abnormal findings: Secondary | ICD-10-CM

## 2018-10-22 MED ORDER — ALBUTEROL SULFATE HFA 108 (90 BASE) MCG/ACT IN AERS: 2.0000 | INHALATION_SPRAY | Freq: Four times a day (QID) | RESPIRATORY_TRACT | 1 refills | Status: DC | PRN

## 2018-10-22 NOTE — Progress Notes (Signed)
Subjective:  Patient ID: Eileen [name], female    DOB: 20-Nov-1970  Age: 48 y.o. MRN: [id number]  CC: Annual Exam   HPI Eileen [name] presents today for a complete physical exam. Her last Pap smear was in 05/2017 and was normal, last mammogram was in 09/2015 with no mammographic evidence of malignancy. She is requesting a refill of her Proventil which she uses for asthma At her last visit with the NP she had complained of intermenstrual bleed with periods occurring about every 15 days; unsure of exact onset of symptoms but menstrual cycles were normal prior to that.  Treated with Provera with some improvement.  She is unsure if her period has returned to normal.  The plan was to refer her for pelvic ultrasound if symptoms persisted. She has also had right arm pain described as shooting pain when she lies on her right side the pain is absent at this time  Past Medical History:  Diagnosis Date  . Arthritis   . Asthma   . CHF (congestive heart failure) ([place])    "unsure thinks lungs had fluid around after surgery 7 years ago"  . Chronic kidney disease    kidney infection  . Neuromuscular disorder ([place])    HERNIA  . Right inguinal hernia   . Shortness of breath     Past Surgical History:  Procedure Laterality Date  . CESAREAN SECTION     x2  . CESAREAN SECTION    . HERNIA REPAIR  09/12/11   RIH  . HERNIA REPAIR    . INGUINAL HERNIA REPAIR  09/12/2011   Procedure: HERNIA REPAIR INGUINAL ADULT;  Surgeon: Gayland Curry, MD;  Location: [facility];  Service: General;  Laterality: Right;  open right inguinal hernia with mesh   . TUBAL LIGATION      Family History  Problem Relation Age of Onset  . Ulcers Father        stomach  . Diabetes Brother   . Colon cancer Neg Hx   . Rectal cancer Neg Hx   . Stomach cancer Neg Hx     No Known Allergies  Outpatient Medications Prior to Visit  Medication Sig Dispense Refill  . levofloxacin (LEVAQUIN) 750 MG tablet Take 1 tablet  (750 mg total) by mouth daily. X 7 days 7 tablet 0  . ibuprofen (ADVIL,MOTRIN) 600 MG tablet Take 1 tablet (600 mg total) by mouth every 8 (eight) hours as needed. (Patient not taking: Reported on 10/19/2018) 60 tablet 0  . medroxyPROGESTERone (PROVERA) 10 MG tablet Take 1 tablet (10 mg total) by mouth daily for 10 days. (Patient not taking: Reported on 10/19/2018) 10 tablet 1   No facility-administered medications prior to visit.      ROS Review of Systems General: negative for fever, weight loss, appetite change Eyes: no visual symptoms. ENT: no ear symptoms, no sinus tenderness, no nasal congestion or sore throat. Neck: no pain  Respiratory: no wheezing, shortness of breath, cough Cardiovascular: no chest pain, no dyspnea on exertion, no pedal edema, no orthopnea. Gastrointestinal: no abdominal pain, no diarrhea, no constipation Genito-Urinary: no urinary frequency, no dysuria, no polyuria. Hematologic: no bruising Endocrine: no cold or heat intolerance Neurological: no headaches, no seizures, no tremors Musculoskeletal: see hpi Skin: no pruritus, no rash. Psychological: no depression, no anxiety,    Objective:  BP 130/76   Pulse 70   Temp 98.7 F (37.1 C) (Oral)   Wt 153 lb (69.4 kg)   SpO2 98%  BMI 29.88 kg/m   BP/Weight 10/22/2018 10/20/2018 9/83/3825  Systolic BP 053 976 -  Diastolic BP 76 65 -  Wt. (Lbs) 153 - 140  BMI 29.88 - 27.34      Physical Exam Constitutional: normal appearing,  Eyes: PERRLA HEENT: Head is atraumatic, normal sinuses, normal oropharynx, normal appearing tonsils and palate, tympanic membrane is normal bilaterally. Neck: normal range of motion, no thyromegaly, no JVD Cardiovascular: normal rate and rhythm, normal heart sounds, no murmurs, rub or gallop, no pedal edema Respiratory: Normal breath sounds, clear to auscultation bilaterally, no wheezes, no rales, no rhonchi Breast: Normal in appearance, no tenderness to palpation, no palpable  masses Abdomen: soft, not tender to palpation, normal bowel sounds, no enlarged organs Musculoskeletal: Full ROM, no tenderness in joints Skin: warm and dry, no lesions. Neurological: alert, oriented x3, cranial nerves I-XII grossly intact , normal motor strength, normal sensation. Psychological: normal mood.   CMP Latest Ref Rng & Units 10/19/2018 09/06/2018 04/24/2016  Glucose 70 - 99 mg/dL 110(H) 87 87  BUN 6 - 20 mg/dL 10 12 11   Creatinine 0.44 - 1.00 mg/dL 0.72 0.84 0.99  Sodium 135 - 145 mmol/L 137 139 138  Potassium 3.5 - 5.1 mmol/L 3.5 4.5 3.7  Chloride 98 - 111 mmol/L 109 103 106  CO2 22 - 32 mmol/L 20(L) 22 24  Calcium 8.9 - 10.3 mg/dL 8.7(L) 9.4 9.4  Total Protein 6.5 - 8.1 g/dL 6.7 6.3 6.4(L)  Total Bilirubin 0.3 - 1.2 mg/dL 0.4 0.3 0.5  Alkaline Phos 38 - 126 U/L 63 73 68  AST 15 - 41 U/L 16 10 14(L)  ALT 0 - 44 U/L 17 12 13(L)    Lipid Panel     Component Value Date/Time   CHOL 170 11/18/2015 0942   TRIG 85 11/18/2015 0942   HDL 48 11/18/2015 0942   CHOLHDL 3.5 11/18/2015 0942   CHOLHDL 4.0 Ratio 06/28/2010 2048   VLDL 26 06/28/2010 2048   LDLCALC 105 (H) 11/18/2015 0942    CBC    Component Value Date/Time   WBC 6.0 10/19/2018 1935   RBC 4.31 10/19/2018 1935   HGB 13.3 10/19/2018 1935   HGB 13.7 09/06/2018 1230   HCT 39.3 10/19/2018 1935   HCT 40.2 09/06/2018 1230   PLT 214 10/19/2018 1935   PLT 304 09/06/2018 1230   MCV 91.2 10/19/2018 1935   MCV 92 09/06/2018 1230   MCH 30.9 10/19/2018 1935   MCHC 33.8 10/19/2018 1935   RDW 14.3 10/19/2018 1935   RDW 13.1 09/06/2018 1230   LYMPHSABS 2.6 07/18/2013 1757   MONOABS 0.7 07/18/2013 1757   EOSABS 0.1 07/18/2013 1757   BASOSABS 0.0 07/18/2013 1757    Lab Results  Component Value Date   HGBA1C 5.5 11/18/2015    Assessment & Plan:   1. Screening for breast cancer - MM DIGITAL SCREENING BILATERAL; Future  2. Annual physical exam Counseled on 150 minutes of exercise per week, healthy eating  (including decreased daily intake of saturated fats, cholesterol, added sugars, sodium), STI prevention, routine healthcare maintenance.   To notify the clinic if irregular periods persist I will refer for pelvic ultrasound. Advised to change positions while sleeping as right arm pain which is absent at this time could be positional and ibuprofen can be used as needed. Meds ordered this encounter  Medications  . albuterol (PROVENTIL HFA;VENTOLIN HFA) 108 (90 Base) MCG/ACT inhaler    Sig: Inhale 2 puffs into the lungs every 6 (six) hours  as needed for wheezing or shortness of breath.    Dispense:  1 Inhaler    Refill:  1    Follow-up: Return in about 3 months (around 01/20/2019) for Follow-up of chronic medical conditions.       Charlott Rakes, MD, FAAFP. [facility] and [place], [place]   10/22/2018, 5:13 PM

## 2018-10-22 NOTE — Patient Instructions (Signed)
[place] (Health Maintenance, Female) Un estilo de vida saludable y los cuidados preventivos pueden favorecer considerablemente a la salud y Musician. Pregunte a su mdico cul es el cronograma de exmenes peridicos apropiado para usted. Esta es una buena oportunidad para consultarlo sobre cmo prevenir enfermedades y [place] sano. Adems de los controles, hay muchas otras cosas que puede hacer usted mismo. Los expertos han realizado numerosas investigaciones [employer] cambios en el estilo de vida y las medidas de prevencin que, [place], lo ayudarn a mantenerse sano. Solicite a su mdico ms informacin. EL PESO Y LA DIETA Consuma una dieta saludable.  Asegrese de [employer] verduras, frutas, productos lcteos de bajo contenido de [place] y Advertising account planner.  No consuma muchos alimentos de alto contenido de grasas slidas, azcares agregados o sal.  Realice actividad fsica con regularidad. Esta es una de las prcticas ms importantes que puede hacer por su salud. ? La Delorise Shiner de los adultos deben hacer ejercicio durante al menos 124mnutos por semana. El ejercicio debe aumentar la frecuencia cardaca y pActorla transpiracin (ejercicio de i[place]. ? La mayora de los adultos tambin deben hacer ejercicios de elongacin al mToysRusveces a la semana. Agregue esto al su plan de ejercicio de intensidad moderada. Mantenga un peso saludable.  El ndice de masa corporal ([facility] es una medida que puede utilizarse para identificar posibles problemas de pEast Uniontown Proporciona una estimacin de la grasa corporal basndose en el peso y la altura. Su mdico puede ayudarle a dRadiation protection practitionerISouth Endy a lScientist, forensico mTheatre managerun peso saludable.  Para las mujeres de 20aos o ms: ? Un IJohn R. Oishei Children'S Hospitalmenor de 18,5 se considera bajo peso. ? Un ICumberland County Hospitalentre 18,5 y 24,9 es normal. ? Un IPelham Medical Centerentre 25 y 29,9 se considera sobrepeso. ? Un IMC de 30 o ms se considera  obesidad. Observe los niveles de colesterol y lpidos en la sangre.  Debe comenzar a rEnglish as a second language teacherde lpidos y cResearch officer, trade unionen la sangre a los 20aos y luego repetirlos cada 516aos  Es posible que nAutomotive engineerlos niveles de colesterol con mayor frecuencia si: ? Sus niveles de lpidos y colesterol son altos. ? Es mayor de 527CWC ? Presenta un alto riesgo de padecer enfermedades cardacas. DETECCIN DE CNCER Cncer de pulmn  Se recomienda realizar exmenes de deteccin de cncer de pulmn a personas adultas entre 574y 892aos que estn en riesgo de dHorticulturist, commercialde pulmn por sus antecedentes de consumo de tabaco.  Se recomienda una tomografa computarizada de baja dosis de los pulmones todos los aos a las personas que: ? Fuman actualmente. ? Hayan dejado el hbito en algn momento en los ltimos 15aos. ? Hayan fumado durante 30aos un paquete diario. Un paquete-ao equivale a fumar un promedio de un paquete de cigarrillos diario durante un ao.  Los exmenes de deteccin anuales deben continuar hasta que hayan pasado 15aos desde que dej de fumar.  Ya no debern realizarse si tiene un problema de salud que le impida recibir tratamiento para eScience writerde pulmn. Cncer de mama  Practique la autoconciencia de la mama. Esto significa reconocer la apariencia normal de sus mamas y cmo las siente.  Tambin significa realizar autoexmenes regulares de lJohnson & Johnson Informe a su mdico sobre cualquier cambio, sin importar cun pequeo sea.  Si tiene entre 20 y 363aos, un mdico debe realizarle un examen clnico de las mamas como parte del examen regular de sCarroll[place] cada 1 a  3aos.  Si tiene 40aos o ms, debe realizarse un examen clnico de las mamas todos los aos. Tambin considere realizarse una radiografa de las mamas (mamografa) todos los aos.  Si tiene antecedentes familiares de cncer de mama, hable con su mdico para someterse a un estudio gentico.  Si  tiene alto riesgo de padecer cncer de mama, hable con su mdico para someterse a una resonancia magntica y una mamografa todos los aos.  La evaluacin del gen del cncer de mama (BRCA) se recomienda a mujeres que tengan familiares con cnceres relacionados con el BRCA. Los cnceres relacionados con el BRCA incluyen los siguientes: ? Mama. ? Ovario. ? Trompas. ? Cnceres de peritoneo.  Los resultados de la evaluacin determinarn la necesidad de asesoramiento gentico y de anlisis de BRCA1 y BRCA2. Cncer de cuello del tero El mdico puede recomendarle que se haga pruebas peridicas de deteccin de cncer de los rganos de la pelvis (ovarios, tero y vagina). Estas pruebas incluyen un examen plvico, que abarca controlar si se produjeron cambios microscpicos en la superficie del cuello del tero (prueba de Papanicolaou). Pueden recomendarle que se haga estas pruebas cada 3aos, a partir de los 21aos.  A las mujeres que tienen entre 30 y 65aos, los mdicos pueden recomendarles que se sometan a exmenes plvicos y pruebas de Papanicolaou cada 3aos, o a la prueba de Papanicolaou y el examen plvico en combinacin con estudios de deteccin del virus del papiloma humano (VPH) cada 5aos. Algunos tipos de VPH aumentan el riesgo de padecer cncer de cuello del tero. La prueba para la deteccin del VPH tambin puede realizarse a mujeres de cualquier edad cuyos resultados de la prueba de Papanicolaou no sean claros.  Es posible que otros mdicos no recomienden exmenes de deteccin a mujeres no embarazadas que se consideran sujetos de bajo riesgo de padecer cncer de pelvis y que no tienen sntomas. Pregntele al mdico si un examen plvico de deteccin es adecuado para usted.  Si ha recibido un tratamiento para el cncer cervical o una enfermedad que podra causar cncer, necesitar realizarse una prueba de Papanicolaou y controles durante al menos 20 aos de concluido el tratamiento. Si no se  ha hecho el Papanicolaou con regularidad, debern volver a evaluarse los factores de riesgo (como tener un nuevo compaero sexual), para determinar si debe realizarse los estudios nuevamente. Algunas mujeres sufren problemas mdicos que aumentan la probabilidad de contraer cncer de cuello del tero. En estos casos, el mdico podr indicar que se realicen controles y pruebas de Papanicolaou con ms frecuencia. Cncer colorrectal  Este tipo de cncer puede detectarse y a menudo prevenirse.  Por lo general, los estudios de rutina se deben comenzar a hacer a partir de los 50 aos y hasta los 75 aos.  Sin embargo, el mdico podr aconsejarle que lo haga antes, si tiene factores de riesgo para el cncer de colon.  Tambin puede recomendarle que use un kit de prueba para hallar sangre oculta en la materia fecal.  Es posible que se use una pequea cmara en el extremo de un tubo para examinar directamente el colon (sigmoidoscopia o colonoscopia) a fin de detectar formas tempranas de cncer colorrectal.  Los exmenes de rutina generalmente comienzan a los 50aos.  El examen directo del colon se debe repetir cada 5 a 10aos hasta los 75aos. Sin embargo, es posible que se realicen exmenes con mayor frecuencia, si se detectan formas tempranas de plipos precancerosos o pequeos bultos. Cncer de piel  Revise la piel   de la cabeza a los pies con regularidad.  Informe a su mdico si aparecen nuevos lunares o los que tiene se modifican, especialmente en su forma y color.  Tambin notifique al mdico si tiene un lunar que es ms grande que el tamao de una goma de lpiz.  Siempre use pantalla solar. Aplique pantalla solar de manera libre y repetida a lo largo del da.  Protjase usando mangas y pantalones largos, un sombrero de ala ancha y gafas para el sol, siempre que se encuentre en el exterior. ENFERMEDADES CARDACAS, DIABETES E HIPERTENSIN ARTERIAL  La hipertensin arterial causa  enfermedades cardacas y aumenta el riesgo de ictus. La hipertensin arterial es ms probable en los siguientes casos: ? Las personas que tienen la presin arterial en el extremo del rango normal (100-139/85-89 mm Hg). ? Las personas con sobrepeso u obesidad. ? Las personas afroamericanas.  Si usted tiene entre 18 y 39 aos, debe medirse la presin arterial cada 3 a 5 aos. Si usted tiene 40 aos o ms, debe medirse la presin arterial todos los aos. Debe medirse la presin arterial dos veces: una vez cuando est en un hospital o una clnica y la otra vez cuando est en otro sitio. Registre el promedio de las dos mediciones. Para controlar su presin arterial cuando no est en un hospital o una clnica, puede usar lo siguiente: ? Una mquina automtica para medir la presin arterial en una farmacia. ? Un monitor para medir la presin arterial en el hogar.  Si tiene entre 55 y 79 aos, consulte a su mdico si debe tomar aspirina para prevenir el ictus.  Realcese exmenes de deteccin de la diabetes con regularidad. Esto incluye la toma de una muestra de sangre para controlar el nivel de azcar en la sangre durante el ayuno. ? Si tiene un peso normal y un bajo riesgo de padecer diabetes, realcese este anlisis cada tres aos despus de los 45aos. ? Si tiene sobrepeso y un alto riesgo de padecer diabetes, considere someterse a este anlisis antes o con mayor frecuencia. PREVENCIN DE INFECCIONES HepatitisB  Si tiene un riesgo ms alto de contraer hepatitis B, debe someterse a un examen de deteccin de este virus. Se considera que tiene un alto riesgo de contraer hepatitis B si: ? Naci en un pas donde la hepatitis B es frecuente. Pregntele a su mdico qu pases son considerados de alto riesgo. ? Sus padres nacieron en un pas de alto riesgo y usted no recibi una vacuna que lo proteja contra la hepatitis B (vacuna contra la hepatitis B). ? Tiene VIH o sida. ? Usa agujas para inyectarse  drogas. ? Vive con alguien que tiene hepatitis B. ? Ha tenido sexo con alguien que tiene hepatitis B. ? Recibe tratamiento de hemodilisis. ? Toma ciertos medicamentos para el cncer, trasplante de rganos y afecciones autoinmunitarias. Hepatitis C  Se recomienda un anlisis de sangre para: ? Todos los que nacieron entre 1945 y 1965. ? Todas las personas que tengan un riesgo de haber contrado hepatitis C. Enfermedades de transmisin sexual (ETS).  Debe realizarse pruebas de deteccin de enfermedades de transmisin sexual (ETS), incluidas gonorrea y clamidia si: ? Es sexualmente activo y es menor de 24aos. ? Es mayor de 24aos, y el mdico le informa que corre riesgo de tener este tipo de infecciones. ? La actividad sexual ha cambiado desde que le hicieron la ltima prueba de deteccin y tiene un riesgo mayor de tener clamidia o gonorrea. Pregntele al mdico si usted   tiene riesgo.  Si no tiene el VIH, pero corre riesgo de infectarse por el virus, se recomienda tomar diariamente un medicamento recetado para evitar la infeccin. Esto se conoce como profilaxis previa a la exposicin. Se considera que est en riesgo si: ? Es [place] sexualmente y no [place] preservativos habitualmente o no conoce el estado del VIH de sus Advertising copywriter. ? Se inyecta drogas. ? Es [place] sexualmente con Ardelia Mems pareja que tiene VIH. Consulte a su mdico para saber si tiene un alto riesgo de infectarse por el VIH. Si opta por comenzar la profilaxis previa a la exposicin, primero debe realizarse anlisis de deteccin del VIH. Luego, le harn anlisis cada 54mses mientras est tomando los medicamentos para la profilaxis previa a la exposicin. EJeff[facility] Center [facility] Si es premenopusica y puede quedar eArbutus solicite a su mdico asesoramiento previo a la concepcin.  Si puede quedar embarazada, tome 400 a [id number]([id number]) de cido fAnheuser-Busch  Si desea evitar el embarazo, hable con su mdico sobre el  control de la natalidad (anticoncepcin). OSTEOPOROSIS Y MENOPAUSIA  La osteoporosis es una enfermedad en la que los huesos pierden los minerales y la fuerza por el avance de la edad. El resultado pueden ser fracturas graves en los hLeaf River El riesgo de osteoporosis puede identificarse con uArdelia Memsprueba de densidad sea.  Si tiene 65aos o ms, o si est en riesgo de sufrir osteoporosis y fracturas, pregunte a su mdico si debe someterse a exmenes.  Consulte a su mdico si debe tomar un suplemento de calcio o de vitamina D para reducir el riesgo de osteoporosis.  La menopausia puede presentar ciertos sntomas fsicos y rGaffer  La terapia de reemplazo hormonal puede reducir algunos de estos sntomas y rGaffer Consulte a su mdico para saber si la terapia de reemplazo hormonal es conveniente para usted. INSTRUCCIONES PARA EL CUIDADO EN EL HOGAR  Realcese los estudios de rutina de la salud, dentales y de lPublic librarian  MKennard  No consuma ningn producto que contenga tabaco, lo que incluye cigarrillos, tabaco de mHigher education careers advisero cPsychologist, sport and exercise  Si est embarazada, no beba alcohol.  Si est amamantando, reduzca el consumo de alcohol y la frecuencia con la que consume.  Si es mujer y no est embarazada limite el consumo de alcohol a no ms de 1 medida por da. Una medida equivale a 12onzas de cerveza, 5onzas de vino o 1onzas de bebidas alcohlicas de alta graduacin.  No consuma drogas.  No comparta agujas.  Solicite ayuda a su mdico si necesita apoyo o informacin para abandonar las drogas.  Informe a su mdico si a menudo se siente deprimido.  Notifique a su mdico si alguna vez ha sido vctima de abuso o si no se siente seguro en su hogar. Esta informacin no tiene cMarine scientistel consejo del mdico. Asegrese de hacerle al mdico cualquier pregunta que tenga. Document Released: 08/03/2011 Document Revised: 09/04/2014 Document Reviewed:  05/18/2015 Elsevier Interactive Patient Education  2019 EReyn[employer]

## 2018-10-25 ENCOUNTER — Encounter: Payer: Self-pay | Admitting: Gastroenterology

## 2018-12-25 ENCOUNTER — Other Ambulatory Visit (HOSPITAL_COMMUNITY): Payer: Self-pay | Admitting: *Deleted

## 2018-12-25 DIAGNOSIS — N644 Mastodynia: Secondary | ICD-10-CM

## 2019-01-14 ENCOUNTER — Ambulatory Visit (HOSPITAL_COMMUNITY)
Admission: RE | Admit: 2019-01-14 | Discharge: 2019-01-14 | Disposition: A | Discharge status: Home / Self Care | Payer: Self-pay | Source: Ambulatory Visit | Attending: Obstetrics and Gynecology | Admitting: Obstetrics and Gynecology

## 2019-01-14 ENCOUNTER — Ambulatory Visit (HOSPITAL_COMMUNITY): Payer: Self-pay

## 2019-01-14 ENCOUNTER — Other Ambulatory Visit: Payer: Self-pay

## 2019-01-14 ENCOUNTER — Encounter (HOSPITAL_COMMUNITY): Payer: Self-pay

## 2019-01-14 VITALS — BP 102/68 | Temp 98.0°F | Wt 149.0 lb

## 2019-01-14 DIAGNOSIS — N644 Mastodynia: Secondary | ICD-10-CM

## 2019-01-14 DIAGNOSIS — Z1239 Encounter for other screening for malignant neoplasm of breast: Secondary | ICD-10-CM

## 2019-01-14 NOTE — Patient Instructions (Signed)
Explained breast self awareness with Eileen [name]. Patient did not need a Pap smear today due to last Pap smear and HPV typing was 06/12/2017. Let her know BCCCP will cover Pap smears and HPV typing every 5 years unless has a history of abnormal Pap smears. Referred patient to the [facility] for diagnostic mammogram. Appointment scheduled for Thursday, Jan 16, 2019 at 1410. Patient aware of appointment and will be there. Discussed smoking Cessation discussed with patient. Referred patient to the [facility] Quitline and gave resources to the free smoking cessation classes offered at [facility]. Eileen [name] verbalized understanding.  [name], Arvil Chaco, RN 2:31 PM

## 2019-01-14 NOTE — Progress Notes (Signed)
Complaints of bilateral diffuse breast pain that starts at the nipple area and radiates all over the breast. Patient states the pain is constant. Patient rates the pain at a 7-8 out of 10.  Pap Smear: Pap smear not completed today. Last Pap smear was 06/12/2017 at [facility] and normal with negative HPV. Per patient has no history of an abnormal Pap smear. Last two Pap smear results are in Epic.  Physical exam: Breasts Breasts symmetrical. No skin abnormalities bilateral breasts. No nipple retraction bilateral breasts. No nipple discharge bilateral breasts. No lymphadenopathy. No lumps palpated bilateral breasts. Complaints of bilateral diffuse breast pain on exam. Referred patient to the [place] for diagnostic mammogram. Appointment scheduled for Thursday, Jan 16, 2019 at 1410.    Pelvic/Bimanual No Pap smear completed today since last Pap smear and HPV typing was 06/12/2017. Pap smear not indicated per BCCCP guidelines.   Smoking History: Patient is a current smoker. Discussed smoking Cessation discussed with patient. Referred patient to the [facility] Quitline and gave resources to the free smoking cessation classes offered at [facility].  Patient Navigation: Patient education provided. Access to services provided for patient through [facility] program. Spanish interpreter provided.   Breast and Cervical Cancer Risk Assessment: Patient has no family history of breast cancer, known genetic mutations, or radiation treatment to the chest before age 18. Patient has no history of cervical dysplasia, immunocompromised, or DES exposure in-utero.  Risk Assessment    Risk Scores      01/14/2019   Last edited by: Armond Hang, LPN   5-year risk: 0.5 %   Lifetime risk: 6 %         Used Spanish interpreter Rudene Anda from [place].

## 2019-01-15 ENCOUNTER — Encounter (HOSPITAL_COMMUNITY): Payer: Self-pay | Admitting: *Deleted

## 2019-01-16 ENCOUNTER — Ambulatory Visit
Admission: RE | Admit: 2019-01-16 | Discharge: 2019-01-16 | Disposition: A | Discharge status: Home / Self Care | Payer: Self-pay | Source: Ambulatory Visit | Attending: Obstetrics and Gynecology | Admitting: Obstetrics and Gynecology

## 2019-01-16 ENCOUNTER — Ambulatory Visit: Payer: Self-pay

## 2019-01-16 ENCOUNTER — Other Ambulatory Visit: Payer: Self-pay

## 2019-01-16 DIAGNOSIS — N644 Mastodynia: Secondary | ICD-10-CM

## 2019-01-27 ENCOUNTER — Ambulatory Visit: Payer: Self-pay | Attending: Family Medicine | Admitting: Family Medicine

## 2019-01-27 ENCOUNTER — Other Ambulatory Visit: Payer: Self-pay

## 2019-08-06 MED FILL — ALBUTEROL SULFATE HFA 108 (: 108 (90 BAS | 25 days supply | Qty: 18 | Fill #0

## 2019-10-22 ENCOUNTER — Ambulatory Visit: Payer: Self-pay | Attending: Family Medicine | Admitting: Family Medicine

## 2019-10-22 ENCOUNTER — Encounter: Payer: Self-pay | Admitting: Family Medicine

## 2019-10-22 ENCOUNTER — Other Ambulatory Visit: Payer: Self-pay

## 2019-10-22 VITALS — BP 103/61 | HR 79 | Temp 98.1°F | Ht 60.0 in | Wt 151.0 lb

## 2019-10-22 DIAGNOSIS — F330 Major depressive disorder, recurrent, mild: Secondary | ICD-10-CM

## 2019-10-22 DIAGNOSIS — G5601 Carpal tunnel syndrome, right upper limb: Secondary | ICD-10-CM

## 2019-10-22 MED ORDER — SERTRALINE HCL 50 MG PO TABS: 50.0000 mg | ORAL_TABLET | Freq: Every day | ORAL | 3 refills | Status: DC

## 2019-10-22 MED ORDER — PREDNISONE 20 MG PO TABS: 20.0000 mg | ORAL_TABLET | Freq: Every day | ORAL | 0 refills | Status: DC

## 2019-10-22 MED FILL — predniSONE 20 MG TABS: 20 | 5 days supply | Qty: 5 | Fill #0

## 2019-10-22 MED FILL — SERTRALINE HCL 50 MG TABLET: 50 | 30 days supply | Qty: 30 | Fill #0

## 2019-10-22 NOTE — Progress Notes (Signed)
Pain in right arm that get worse at nigh, finger are going numb.

## 2019-10-22 NOTE — Progress Notes (Signed)
Subjective:  Patient ID: Eileen [name], female    DOB: December 14, 1970  Age: 49 y.o. MRN: [id number]  CC: Asthma and Hand Pain   HPI Eileen [name] presents today with the following concerns.  She has pain in her R hand with associated numbness in her fingers worse in her R ring finger; this pain shoots to her medial epicondyle. Pain prevents her from sleeping She noticed edema of the fingers of her R hand which occurs in the morning. Symptoms have been on for 8-10 years but have worsened recently. Unable to open things with her R hand; she is right handed   She would like to get back on the antidepressant she was placed on a while ago.  Previously told she is no longer needed it but would not like to get back on it.  Denies suicidal ideation or intents, PHQ-9 is 12.  Past Medical History:  Diagnosis Date  . Arthritis   . Asthma   . CHF (congestive heart failure) ([place])    "unsure thinks lungs had fluid around after surgery 7 years ago"  . Chronic kidney disease    kidney infection  . Neuromuscular disorder ([place])    HERNIA  . Right inguinal hernia   . Shortness of breath     Past Surgical History:  Procedure Laterality Date  . CESAREAN SECTION     x2  . CESAREAN SECTION    . HERNIA REPAIR  09/12/11   RIH  . HERNIA REPAIR    . INGUINAL HERNIA REPAIR  09/12/2011   Procedure: HERNIA REPAIR INGUINAL ADULT;  Surgeon: Gayland Curry, MD;  Location: [place];  Service: General;  Laterality: Right;  open right inguinal hernia with mesh   . TUBAL LIGATION      Family History  Problem Relation Age of Onset  . Ulcers Father        stomach  . Diabetes Brother   . Colon cancer Neg Hx   . Rectal cancer Neg Hx   . Stomach cancer Neg Hx   . Breast cancer Neg Hx     No Known Allergies  Outpatient Medications Prior to Visit  Medication Sig Dispense Refill  . albuterol (PROVENTIL HFA;VENTOLIN HFA) 108 (90 Base) MCG/ACT inhaler Inhale 2 puffs into the lungs every 6 (six)  hours as needed for wheezing or shortness of breath. (Patient not taking: Reported on 01/14/2019) 1 Inhaler 1  . ibuprofen (ADVIL,MOTRIN) 600 MG tablet Take 1 tablet (600 mg total) by mouth every 8 (eight) hours as needed. (Patient not taking: Reported on 10/19/2018) 60 tablet 0  . levofloxacin (LEVAQUIN) 750 MG tablet Take 1 tablet (750 mg total) by mouth daily. X 7 days (Patient not taking: Reported on 01/14/2019) 7 tablet 0  . medroxyPROGESTERone (PROVERA) 10 MG tablet Take 1 tablet (10 mg total) by mouth daily for 10 days. (Patient not taking: Reported on 10/19/2018) 10 tablet 1   No facility-administered medications prior to visit.     ROS Review of Systems  Constitutional: Negative for activity change, appetite change and fatigue.  HENT: Negative for congestion, sinus pressure and sore throat.   Eyes: Negative for visual disturbance.  Respiratory: Negative for cough, chest tightness, shortness of breath and wheezing.   Cardiovascular: Negative for chest pain and palpitations.  Gastrointestinal: Negative for abdominal distention, abdominal pain and constipation.  Endocrine: Negative for polydipsia.  Genitourinary: Negative for dysuria and frequency.  Musculoskeletal: Negative for arthralgias and back pain.  Skin: Negative for rash.  Neurological: Negative for tremors, light-headedness and numbness.  Hematological: Does not bruise/bleed easily.  Psychiatric/Behavioral: Negative for agitation and behavioral problems.    Objective:  BP 103/61   Pulse 79   Temp 98.1 F (36.7 C) (Temporal)   Ht 5' (1.524 m)   Wt 151 lb (68.5 kg)   SpO2 99%   BMI 29.49 kg/m   BP/Weight 10/22/2019 01/14/2019 [id number]  Systolic BP [id number]  Diastolic BP 61 68 76  Wt. (Lbs) 151 149 153  BMI 29.49 29.1 29.88      Physical Exam Constitutional:      Appearance: She is well-developed.  Neck:     Vascular: No JVD.  Cardiovascular:     Rate and Rhythm: Normal rate.     Heart sounds: Normal  heart sounds. No murmur.  Pulmonary:     Effort: Pulmonary effort is normal.     Breath sounds: Normal breath sounds. No wheezing or rales.  Chest:     Chest wall: No tenderness.  Abdominal:     General: Bowel sounds are normal. There is no distension.     Palpations: Abdomen is soft. There is no mass.     Tenderness: There is no abdominal tenderness.  Musculoskeletal:        General: Normal range of motion.     Right lower leg: No edema.     Left lower leg: No edema.     Comments: Positive Tinel sign on the right Negative Phalen sign bilaterally  Neurological:     Mental Status: She is alert and oriented to person, place, and time.  Psychiatric:        Mood and Affect: Mood normal.     CMP Latest Ref Rng & Units 10/19/2018 09/06/2018 04/24/2016  Glucose 70 - 99 mg/dL 110(H) 87 87  BUN 6 - 20 mg/dL 10 12 11   Creatinine 0.44 - 1.00 mg/dL 0.72 0.84 0.99  Sodium 135 - 145 mmol/L 137 139 138  Potassium 3.5 - 5.1 mmol/L 3.5 4.5 3.7  Chloride 98 - 111 mmol/L 109 103 106  CO2 22 - 32 mmol/L 20(L) 22 24  Calcium 8.9 - 10.3 mg/dL 8.7(L) 9.4 9.4  Total Protein 6.5 - 8.1 g/dL 6.7 6.3 6.4(L)  Total Bilirubin 0.3 - 1.2 mg/dL 0.4 0.3 0.5  Alkaline Phos 38 - 126 U/L 63 73 68  AST 15 - 41 U/L 16 10 14(L)  ALT 0 - 44 U/L 17 12 13(L)    Lipid Panel     Component Value Date/Time   CHOL 170 11/18/2015 0942   TRIG 85 11/18/2015 0942   HDL 48 11/18/2015 0942   CHOLHDL 3.5 11/18/2015 0942   CHOLHDL 4.0 Ratio 06/28/2010 2048   VLDL 26 06/28/2010 2048   LDLCALC 105 (H) 11/18/2015 0942    CBC    Component Value Date/Time   WBC 6.0 10/19/2018 1935   RBC 4.31 10/19/2018 1935   HGB 13.3 10/19/2018 1935   HGB 13.7 09/06/2018 1230   HCT 39.3 10/19/2018 1935   HCT 40.2 09/06/2018 1230   PLT 214 10/19/2018 1935   PLT 304 09/06/2018 1230   MCV 91.2 10/19/2018 1935   MCV 92 09/06/2018 1230   MCH 30.9 10/19/2018 1935   MCHC 33.8 10/19/2018 1935   RDW 14.3 10/19/2018 1935   RDW 13.1  09/06/2018 1230   LYMPHSABS 2.6 07/18/2013 1757   MONOABS 0.7 07/18/2013 1757   EOSABS 0.1 07/18/2013 1757   BASOSABS 0.0 07/18/2013 1757  Lab Results  Component Value Date   HGBA1C 5.5 11/18/2015    Depression screen [facility] 2/9 10/22/2019 10/22/2018 09/06/2018 08/15/2017 06/12/2017  Decreased Interest 2 1 1  0 0  Down, Depressed, Hopeless 2 0 1 0 1  PHQ - 2 Score 4 1 2  0 1  Altered sleeping 2 0 1 0 0  Tired, decreased energy 1 1 2  0 1  Change in appetite 2 0 1 0 0  Feeling bad or failure about yourself  1 0 0 0 0  Trouble concentrating 1 0 0 0 0  Moving slowly or fidgety/restless 0 0 0 0 0  Suicidal thoughts 1 0 0 0 0  PHQ-9 Score 12 2 6  0 2  Some recent data might be hidden     Assessment & Plan:   1. Carpal tunnel syndrome of right wrist Advised to obtain wrist brace - predniSONE (DELTASONE) 20 MG tablet; Take 1 tablet (20 mg total) by mouth daily with breakfast.  Dispense: 5 tablet; Refill: 0 - Basic Metabolic Panel  2. Mild episode of recurrent major depressive disorder ([place]) Uncontrolled We will commence Zoloft - sertraline (ZOLOFT) 50 MG tablet; Take 1 tablet (50 mg total) by mouth daily.  Dispense: 30 tablet; Refill: 3   Return in about 6 months (around 04/20/2020) for medical conditions.  Charlott Rakes, MD, FAAFP. [facility] and [place], [place]   10/22/2019, 4:44 PM

## 2019-10-22 NOTE — Patient Instructions (Signed)
Sndrome del tnel carpiano Carpal Tunnel Syndrome  El sndrome del tnel carpiano es una afeccin que causa dolor en la mano y en el brazo. El tnel carpiano es un rea estrecha que se encuentra en el lado palmar de la mueca. Los movimientos repetidos de la mueca o determinadas enfermedades pueden causar hinchazn en el tnel. Esta hinchazn puede comprimir el nervio principal de la mueca (nervio mediano). Cules son las causas? Esta afeccin puede ser causada por lo siguiente:  Movimientos repetidos de la mueca.  Lesiones en la mueca.  Artritis.  Un saco lleno de lquido (quiste) o un crecimiento anormal (tumor) en el tnel carpiano.  Acumulacin de lquido durante el embarazo. Algunas veces, la causa no se conoce. Qu incrementa el riesgo? Los siguientes factores pueden hacer que sea ms propenso a contraer esta afeccin:  Tener un trabajo que exige mover la mueca del mismo modo muchas veces. Esto incluye trabajos como el de carnicero o el de cajero.  Ser mujer.  Tener otras afecciones, por ejemplo: ? Diabetes. ? Obesidad. ? Una glndula tiroidea que no es lo suficientemente activa (hipotiroidismo). ? Insuficiencia renal. Cules son los signos o sntomas? Los sntomas de esta afeccin incluyen:  Sensacin de hormigueo en los dedos.  Hormigueo o prdida de la sensibilidad (adormecimiento) en la mano.  Dolor en todo el brazo. Este dolor puede empeorar al flexionar la mueca y el codo durante mucho tiempo.  Dolor en la mueca que sube por el brazo hasta el hombro.  Dolor que baja hasta la palma de la mano o los dedos.  Sensacin de debilidad en las manos. Puede resultarle difcil tomar y sostener objetos. Es posible que se sienta peor por la noche. Cmo se diagnostica? Esta afeccin se diagnostica mediante los antecedentes mdicos y un examen fsico. Tambin pueden hacerle estudios, por ejemplo:  Una electromiograma (EMG). Este estudio comprueba las seales  que los nervios envan a los msculos.  Estudio de conduccin nerviosa. Este estudio comprueba si las seales pasan correctamente por los nervios.  Estudios de diagnstico por imgenes, como radiografas, una ecografa y una resonancia magntica (RM). Estos estudios permiten detectar cul podra ser la causa de su afeccin. Cmo se trata? El tratamiento de esta afeccin puede incluir:  Cambios en el estilo de vida. Se le pedir que deje o cambie la actividad que caus el problema.  Hacer ejercicio y actividades para que los msculos y los huesos se vuelvan ms fuertes (fisioterapia).  Aprender a usar la mano nuevamente (terapia ocupacional).  Medicamentos para tratar el dolor y la hinchazn (inflamacin). Es posible que le apliquen inyecciones en la mueca.  Una frula para la mueca.  Una ciruga. Siga estas indicaciones en su casa: Si tiene una frula:  Use la frula como se lo haya indicado el mdico. Quteselo solamente como se lo haya indicado el mdico.  Afloje la frula si los dedos: ? Hormiguean. ? Pierden la sensibilidad (se adormecen). ? Se tornan fros y de color azul.  Mantenga la frula limpia.  Si la frula no es impermeable: ? No deje que se moje. ? Cbralo con un envoltorio hermtico cuando tome un bao de inmersin o una ducha. Control del dolor, la rigidez y la hinchazn   Si se lo indican, aplique hielo sobre la zona dolorida: ? Si tiene una frula desmontable, qutesela como se lo haya indicado el mdico. ? Ponga el hielo en una bolsa plstica. ? Coloque una toalla entre la piel y la bolsa. ? Coloque el hielo   durante 80minutos, 2a3veces al [employer]. Indicaciones generales  [employer] de venta libre y los recetados solamente como se lo haya indicado el mdico.  Descanse la [place] de cualquier actividad que le cause dolor. Si es necesario, hable con su jefe en el [employer] cambios que pueden ayudar a la curacin de la [place].  Haga  los ejercicios como se lo hayan indicado el mdico, el fisioterapeuta o el terapeuta ocupacional.  Concurra a todas las visitas de seguimiento como se lo haya indicado el mdico. Esto es importante. Comunquese con un mdico si:  Aparecen nuevos sntomas.  Los medicamentos no Forensic psychologist.  Sus sntomas empeoran. Solicite ayuda de inmediato si:  Tiene adormecimiento u hormigueo muy intensos en la mueca o la [place]. Resumen  El sndrome del tnel carpiano es una afeccin que causa dolor en la mano y en el brazo.  Suele deberse a movimientos repetidos de Advertising copywriter.  Este problema se trata mediante cambios en el estilo de vida y medicamentos. La ciruga puede ser necesaria en casos muy graves.  Siga las indicaciones del mdico sobre el uso de una frula, el reposo de la [place], la asistencia a las consultas de control y Solicitor para pedir ayuda. Esta informacin no tiene Marine scientist el consejo del mdico. Asegrese de hacerle al mdico cualquier pregunta que tenga. Document Revised: 01/17/2018 Document Reviewed: 01/17/2018 Elsevier Patient Education  2020 [employer].

## 2019-10-23 ENCOUNTER — Encounter: Payer: Self-pay | Admitting: Family Medicine

## 2019-10-23 LAB — BASIC METABOLIC PANEL
BUN/Creatinine Ratio: 19 (ref 9–23)
BUN: 15 mg/dL (ref 6–24)
CO2: 20 mmol/L (ref 20–29)
Calcium: 9.3 mg/dL (ref 8.7–10.2)
Chloride: 107 mmol/L — ABNORMAL HIGH (ref 96–106)
Creatinine, Ser: 0.78 mg/dL (ref 0.57–1.00)
GFR calc Af Amer: 105 mL/min/{1.73_m2} (ref >59)
GFR calc non Af Amer: 91 mL/min/{1.73_m2} (ref >59)
Glucose: 82 mg/dL (ref 65–99)
Potassium: 4.2 mmol/L (ref 3.5–5.2)
Sodium: 142 mmol/L (ref 134–144)

## 2019-10-24 ENCOUNTER — Telehealth: Payer: Self-pay

## 2019-10-24 NOTE — Telephone Encounter (Signed)
Patient name and DOB has been verified Patient was informed of lab results. Patient had no questions.  

## 2019-10-24 NOTE — Telephone Encounter (Signed)
-----   Message from Charlott Rakes, MD sent at 10/23/2019 12:17 PM EST ----- Please inform her labs are normal.

## 2020-03-19 ENCOUNTER — Other Ambulatory Visit: Payer: Self-pay

## 2020-03-19 ENCOUNTER — Ambulatory Visit: Payer: Self-pay | Attending: Family Medicine

## 2020-03-31 ENCOUNTER — Ambulatory Visit: Payer: Self-pay | Attending: Family Medicine | Admitting: Family Medicine

## 2020-03-31 ENCOUNTER — Encounter: Payer: Self-pay | Admitting: Family Medicine

## 2020-03-31 ENCOUNTER — Other Ambulatory Visit: Payer: Self-pay | Admitting: Family Medicine

## 2020-03-31 ENCOUNTER — Other Ambulatory Visit: Payer: Self-pay

## 2020-03-31 VITALS — BP 109/63 | HR 78 | Ht 60.0 in | Wt 160.8 lb

## 2020-03-31 DIAGNOSIS — B86 Scabies: Secondary | ICD-10-CM

## 2020-03-31 DIAGNOSIS — J4520 Mild intermittent asthma, uncomplicated: Secondary | ICD-10-CM

## 2020-03-31 DIAGNOSIS — F330 Major depressive disorder, recurrent, mild: Secondary | ICD-10-CM

## 2020-03-31 DIAGNOSIS — M25531 Pain in right wrist: Secondary | ICD-10-CM

## 2020-03-31 DIAGNOSIS — N898 Other specified noninflammatory disorders of vagina: Secondary | ICD-10-CM

## 2020-03-31 MED ORDER — PERMETHRIN 5 % EX CREA: 1.0000 "application " | TOPICAL_CREAM | Freq: Once | CUTANEOUS | 1 refills | Status: AC

## 2020-03-31 MED ORDER — SERTRALINE HCL 50 MG PO TABS: 50.0000 mg | ORAL_TABLET | Freq: Every day | ORAL | 3 refills | Status: DC

## 2020-03-31 MED ORDER — CLOTRIMAZOLE 1 % EX CREA: 1.0000 "application " | TOPICAL_CREAM | Freq: Two times a day (BID) | CUTANEOUS | 0 refills | Status: DC

## 2020-03-31 MED ORDER — IBUPROFEN 600 MG PO TABS: 600.0000 mg | ORAL_TABLET | Freq: Three times a day (TID) | ORAL | 1 refills | Status: DC | PRN

## 2020-03-31 MED ORDER — ALBUTEROL SULFATE HFA 108 (90 BASE) MCG/ACT IN AERS: 2.0000 | INHALATION_SPRAY | Freq: Four times a day (QID) | RESPIRATORY_TRACT | 3 refills | Status: DC | PRN

## 2020-03-31 MED FILL — PERMETHRIN 5 % CREA: 5 | 7 days supply | Qty: 60 | Fill #0

## 2020-03-31 MED FILL — IBUPROFEN 600 MG TABLET: 600 | 20 days supply | Qty: 60 | Fill #0

## 2020-03-31 MED FILL — ALBUTEROL SULFATE HFA 108 (: 108 (90 BAS | 25 days supply | Qty: 9 | Fill #0

## 2020-03-31 MED FILL — SERTRALINE HCL 50 MG TABLET: 50 | 30 days supply | Qty: 30 | Fill #0

## 2020-03-31 NOTE — Progress Notes (Signed)
Subjective:  Patient ID: Eileen [name], female    DOB: May 08, 1971  Age: 49 y.o. MRN: [id number]  CC: Rash   HPI Eileen [name] is a 49 year old female with a history of asthma, depression who presents today for follow-up visit.  She has been out of her Zoloft for some time and is requesting refills as she does notice more depression when she is out of Zoloft. She complains of itchy bumps on her body for the last 3 months and it feels like something bites her mostly at night. Also complains of vaginal itching and is unsure if this is related to her generalized pruritus but denies presence of vaginal discharge or urinary symptoms. Also request a refill of ibuprofen for her right carpal tunnel syndrome. Past Medical History:  Diagnosis Date  . Arthritis   . Asthma   . CHF (congestive heart failure) ([place])    "unsure thinks lungs had fluid around after surgery 7 years ago"  . Chronic kidney disease    kidney infection  . Neuromuscular disorder ([place])    HERNIA  . Right inguinal hernia   . Shortness of breath     Past Surgical History:  Procedure Laterality Date  . CESAREAN SECTION     x2  . CESAREAN SECTION    . HERNIA REPAIR  09/12/11   RIH  . HERNIA REPAIR    . INGUINAL HERNIA REPAIR  09/12/2011   Procedure: HERNIA REPAIR INGUINAL ADULT;  Surgeon: Gayland Curry, MD;  Location: [place];  Service: General;  Laterality: Right;  open right inguinal hernia with mesh   . TUBAL LIGATION      Family History  Problem Relation Age of Onset  . Ulcers Father        stomach  . Diabetes Brother   . Colon cancer Neg Hx   . Rectal cancer Neg Hx   . Stomach cancer Neg Hx   . Breast cancer Neg Hx     No Known Allergies  Outpatient Medications Prior to Visit  Medication Sig Dispense Refill  . albuterol (PROVENTIL HFA;VENTOLIN HFA) 108 (90 Base) MCG/ACT inhaler Inhale 2 puffs into the lungs every 6 (six) hours as needed for wheezing or shortness of breath. (Patient not  taking: Reported on 01/14/2019) 1 Inhaler 1  . ibuprofen (ADVIL,MOTRIN) 600 MG tablet Take 1 tablet (600 mg total) by mouth every 8 (eight) hours as needed. (Patient not taking: Reported on 10/19/2018) 60 tablet 0  . levofloxacin (LEVAQUIN) 750 MG tablet Take 1 tablet (750 mg total) by mouth daily. X 7 days (Patient not taking: Reported on 01/14/2019) 7 tablet 0  . medroxyPROGESTERone (PROVERA) 10 MG tablet Take 1 tablet (10 mg total) by mouth daily for 10 days. (Patient not taking: Reported on 10/19/2018) 10 tablet 1  . predniSONE (DELTASONE) 20 MG tablet Take 1 tablet (20 mg total) by mouth daily with breakfast. (Patient not taking: Reported on 03/31/2020) 5 tablet 0  . sertraline (ZOLOFT) 50 MG tablet Take 1 tablet (50 mg total) by mouth daily. (Patient not taking: Reported on 03/31/2020) 30 tablet 3   No facility-administered medications prior to visit.     ROS Review of Systems  Constitutional: Negative for activity change, appetite change and fatigue.  HENT: Negative for congestion, sinus pressure and sore throat.   Eyes: Negative for visual disturbance.  Respiratory: Negative for cough, chest tightness, shortness of breath and wheezing.   Cardiovascular: Negative for chest pain and palpitations.  Gastrointestinal: Negative for abdominal distention,  abdominal pain and constipation.  Endocrine: Negative for polydipsia.  Genitourinary: Negative for dysuria and frequency.  Musculoskeletal: Negative for arthralgias and back pain.  Skin: Positive for rash.  Neurological: Negative for tremors, light-headedness and numbness.  Hematological: Does not bruise/bleed easily.  Psychiatric/Behavioral: Negative for agitation and behavioral problems.    Objective:  BP 109/63   Pulse 78   Ht 5' (1.524 m)   Wt 160 lb 12.8 oz (72.9 kg)   SpO2 99%   BMI 31.40 kg/m   BP/Weight 03/31/2020 10/22/2019 7/61/6073  Systolic BP 710 626 948  Diastolic BP 63 61 68  Wt. (Lbs) 160.8 151 149  BMI 31.4 29.49 29.1        Physical Exam Constitutional:      Appearance: She is well-developed.  Neck:     Vascular: No JVD.  Cardiovascular:     Rate and Rhythm: Normal rate.     Heart sounds: Normal heart sounds. No murmur heard.   Pulmonary:     Effort: Pulmonary effort is normal.     Breath sounds: Normal breath sounds. No wheezing or rales.  Chest:     Chest wall: No tenderness.  Abdominal:     General: Bowel sounds are normal. There is no distension.     Palpations: Abdomen is soft. There is no mass.     Tenderness: There is no abdominal tenderness.  Musculoskeletal:        General: Normal range of motion.     Right lower leg: No edema.     Left lower leg: No edema.  Skin:    Comments: Pinpoint rash above gluteal region  Neurological:     Mental Status: She is alert and oriented to person, place, and time.  Psychiatric:        Mood and Affect: Mood normal.     CMP Latest Ref Rng & Units 10/22/2019 10/19/2018 09/06/2018  Glucose 65 - 99 mg/dL 82 110(H) 87  BUN 6 - 24 mg/dL 15 10 12   Creatinine 0.57 - 1.00 mg/dL 0.78 0.72 0.84  Sodium 134 - 144 mmol/L 142 137 139  Potassium 3.5 - 5.2 mmol/L 4.2 3.5 4.5  Chloride 96 - 106 mmol/L 107(H) 109 103  CO2 20 - 29 mmol/L 20 20(L) 22  Calcium 8.7 - 10.2 mg/dL 9.3 8.7(L) 9.4  Total Protein 6.5 - 8.1 g/dL - 6.7 6.3  Total Bilirubin 0.3 - 1.2 mg/dL - 0.4 0.3  Alkaline Phos 38 - 126 U/L - 63 73  AST 15 - 41 U/L - 16 10  ALT 0 - 44 U/L - 17 12    Lipid Panel     Component Value Date/Time   CHOL 170 11/18/2015 0942   TRIG 85 11/18/2015 0942   HDL 48 11/18/2015 0942   CHOLHDL 3.5 11/18/2015 0942   CHOLHDL 4.0 Ratio 06/28/2010 2048   VLDL 26 06/28/2010 2048   LDLCALC 105 (H) 11/18/2015 0942    CBC    Component Value Date/Time   WBC 6.0 10/19/2018 1935   RBC 4.31 10/19/2018 1935   HGB 13.3 10/19/2018 1935   HGB 13.7 09/06/2018 1230   HCT 39.3 10/19/2018 1935   HCT 40.2 09/06/2018 1230   PLT 214 10/19/2018 1935   PLT 304 09/06/2018  1230   MCV 91.2 10/19/2018 1935   MCV 92 09/06/2018 1230   MCH 30.9 10/19/2018 1935   MCHC 33.8 10/19/2018 1935   RDW 14.3 10/19/2018 1935   RDW 13.1 09/06/2018 1230   LYMPHSABS  2.6 07/18/2013 1757   MONOABS 0.7 07/18/2013 1757   EOSABS 0.1 07/18/2013 1757   BASOSABS 0.0 07/18/2013 1757    Lab Results  Component Value Date   HGBA1C 5.5 11/18/2015    Assessment & Plan:   1. Scabies Discussed care of bedding - permethrin (ELIMITE) 5 % cream; Apply 1 application topically once for 1 dose. Then repeat in 72 hours if needed  Dispense: 60 g; Refill: 1  2. Mild episode of recurrent major depressive disorder (H[facility]) Stable - sertraline (ZOLOFT) 50 MG tablet; Take 1 tablet (50 mg total) by mouth daily.  Dispense: 30 tablet; Refill: 3  3. Right wrist pain Controlled - ibuprofen (ADVIL) 600 MG tablet; Take 1 tablet (600 mg total) by mouth every 8 (eight) hours as needed.  Dispense: 60 tablet; Refill: 1  4. Vaginal itching - clotrimazole (LOTRIMIN) 1 % cream; Apply 1 application topically 2 (two) times daily.  Dispense: 30 g; Refill: 0  5. Mild intermittent asthma without complication - albuterol (VENTOLIN HFA) 108 (90 Base) MCG/ACT inhaler; Inhale 2 puffs into the lungs every 6 (six) hours as needed for wheezing or shortness of breath.  Dispense: 18 g; Refill: 3  Meds ordered this encounter  Medications  . sertraline (ZOLOFT) 50 MG tablet    Sig: Take 1 tablet (50 mg total) by mouth daily.    Dispense:  30 tablet    Refill:  3  . permethrin (ELIMITE) 5 % cream    Sig: Apply 1 application topically once for 1 dose. Then repeat in 72 hours if needed    Dispense:  60 g    Refill:  1  . clotrimazole (LOTRIMIN) 1 % cream    Sig: Apply 1 application topically 2 (two) times daily.    Dispense:  30 g    Refill:  0  . albuterol (VENTOLIN HFA) 108 (90 Base) MCG/ACT inhaler    Sig: Inhale 2 puffs into the lungs every 6 (six) hours as needed for wheezing or shortness of breath.     Dispense:  18 g    Refill:  3  . ibuprofen (ADVIL) 600 MG tablet    Sig: Take 1 tablet (600 mg total) by mouth every 8 (eight) hours as needed.    Dispense:  60 tablet    Refill:  1    Follow-up: Return in about 6 months (around 10/01/2020) for Chronic disease management.       Charlott Rakes, MD, FAAFP. [facility] and [place], [place]   03/31/2020, 12:20 PM

## 2020-03-31 NOTE — Progress Notes (Signed)
Has bumps on her body that itch.  Patient needs OC before she can be seen at dentist, patient has dentist already.

## 2020-03-31 NOTE — Patient Instructions (Signed)
Sarna en los adultos Scabies, Adult  La sarna es una afeccin cutnea que se produce cuando insectos muy pequeos se introducen debajo de la piel (infestacin). Esto causa una erupcin cutnea y picazn intensa. La sarna puede transmitirse de una persona a otra (es contagiosa). Si una persona tiene sarna, es frecuente que los dems miembros de la familia tambin contraigan la afeccin. Los sntomas suelen desaparecer en el trmino de 2 a 4semanas con el tratamiento adecuado. Por lo general, la sarna no causa problemas crnicos. Cules son las causas? La causa de esta afeccin son pequeos caros (Sarcoptes scabiei o aradores de la sarna) que pueden verse solamente con un microscopio. Los caros se introducen en la capa superior de la piel y ponen huevos. La sarna puede transmitirse de una persona a otra a travs de lo siguiente:  El contacto cercano con una persona que tiene sarna.  El uso compartido o el contacto con elementos infectados, como toallas, sbanas o ropa. Qu incrementa el riesgo? Los siguientes factores pueden hacer que sea ms propensa a desarrollar esta afeccin:  Vivir en un hogar de ancianos u otro centro de cuidados prolongados.  Tener contacto sexual con un compaero que tiene sarna.  Cuidar a otras personas que corren un riesgo elevado de tener sarna. Cules son los signos o los sntomas? Los sntomas de esta afeccin incluyen:  Picazn intensa. La picazn generalmente empeora por la noche.  Una erupcin cutnea con pequeos bultos rojos o ampollas. La erupcin cutnea suele aparecer en las manos, las muecas, los codos, las axilas, el pecho, la cintura, la ingle o las nalgas. Los bultos pueden formar una lnea (madriguera) en algunas reas.  Irritacin de la piel. Esta puede incluir lceras o manchas escamosas. Cmo se diagnostica? Esta afeccin se puede diagnosticar en funcin de lo siguiente:  Un examen fsico de la piel.  Una prueba cutnea. El mdico  podra tomar una muestra de la piel afectada (raspado de la piel) y hacerla examinar con un microscopio para detectar la presencia de caros. Cmo se trata? El tratamiento de esta afeccin puede incluir:  Crema o locin con un medicamento para destruir los caros. Este producto se esparce por todo el cuerpo y se deja durante varias horas. Por lo general, un nico tratamiento con crema o locin con medicamento es suficiente para matar todos los caros. En los casos graves, es posible que sea necesario repetir el tratamiento.  Crema con medicamento para aliviar la picazn.  Medicamentos que se toman por boca (va oral) cuya accin es: ? Aliviar la picazn. ? Reducir la hinchazn y el enrojecimiento. ? Destruir los caros. Este tratamiento puede realizarse en casos graves. Siga estas instrucciones en su casa: Medicamentos   Tome o aplquese los medicamentos de venta libre y los recetados como se lo haya indicado el mdico.  Aplique la crema o locin con medicamento como se lo haya indicado el mdico.  No enjuague la crema o locin con medicamento hasta tanto haya transcurrido el tiempo necesario. Cuidado de la piel   Evite rascarse las zonas de la piel que estn afectadas.  Mantenga bien cortas las uas de las manos para reducir las lesiones que se producen al rascarse.  Para aliviar la picazn, tome baos fros o aplquese paos fros en la piel. Instrucciones generales  Lave todos los objetos con los que haya tenido contacto reciente, entre ellos, la ropa de cama, las prendas de vestir y los muebles. Haga esto el mismo da que comience el tratamiento. ?   Seque los elementos limpios o use agua caliente para lavar los elementos. Seque los elementos en el ciclo de aire seco caliente. ? Coloque en bolsas de plstico hermticas durante al menos 3das los objetos que no se puedan lavar. Los caros no sobreviven ms de 3das alejados de la piel humana. ? Pase la aspiradora por los muebles  y los colchones que utiliza.  Asegrese de que un mdico examine a las dems personas que puedan haberse infestado. Esto incluye a los miembros de su familia y a cualquier persona que pueda haber tenido contacto con los objetos infestados.  Concurra a todas las visitas de seguimiento como se lo haya indicado el mdico. Esto es importante. Comunquese con un mdico si:  La picazn no desaparece despus de 4semanas de tratamiento.  Le siguen apareciendo nuevos bultos o surcos.  Tiene enrojecimiento, hinchazn o dolor en la zona de la erupcin cutnea despus del tratamiento.  Observa lquido, sangre o pus que salen de la erupcin cutnea. Resumen  La sarna es una afeccin de la piel que causa erupcin cutnea y picazn intensa.  La causa de esta afeccin son caros diminutos que penetran en la capa superior de la piel y ponen huevos.  La sarna puede transmitirse de una persona a otra.  Siga los tratamientos que le haya recomendado el mdico.  Limpie todos los elementos con los que haya tenido contacto recientemente. Esta informacin no tiene como fin reemplazar el consejo del mdico. Asegrese de hacerle al mdico cualquier pregunta que tenga. Document Revised: 07/26/2018 Document Reviewed: 07/26/2018 Elsevier Patient Education  2020 Elsevier Inc.  

## 2020-04-05 ENCOUNTER — Other Ambulatory Visit: Payer: Self-pay | Admitting: Obstetrics and Gynecology

## 2020-04-05 DIAGNOSIS — Z1231 Encounter for screening mammogram for malignant neoplasm of breast: Secondary | ICD-10-CM

## 2020-05-06 ENCOUNTER — Ambulatory Visit
Admission: RE | Admit: 2020-05-06 | Discharge: 2020-05-06 | Disposition: A | Discharge status: Home / Self Care | Payer: No Typology Code available for payment source | Source: Ambulatory Visit | Attending: Obstetrics and Gynecology | Admitting: Obstetrics and Gynecology

## 2020-05-06 ENCOUNTER — Ambulatory Visit: Payer: Self-pay

## 2020-05-06 ENCOUNTER — Ambulatory Visit: Payer: Self-pay | Admitting: *Deleted

## 2020-05-06 ENCOUNTER — Other Ambulatory Visit: Payer: Self-pay

## 2020-05-06 VITALS — BP 124/60 | Temp 97.3°F | Wt 155.6 lb

## 2020-05-06 DIAGNOSIS — Z1239 Encounter for other screening for malignant neoplasm of breast: Secondary | ICD-10-CM

## 2020-05-06 DIAGNOSIS — Z1231 Encounter for screening mammogram for malignant neoplasm of breast: Secondary | ICD-10-CM

## 2020-05-06 NOTE — Progress Notes (Signed)
[name] is a 49 y.o. female who presents to [facility] clinic today with complaints of bilateral diffuse breast pain that starts at the nipple area and radiates all over the breast. Patient states the pain come and goes. Patient rates the pain at a 8-9 out of 10.   Pap Smear: Pap smear not completed today. Last Pap smear was 06/12/2017 at [facility] and normal with negative HPV. Per patient has no history of an abnormal Pap smear. Last two Pap smear results are in Epic.   Physical exam: Breasts Breasts symmetrical. No skin abnormalities bilateral breasts. No nipple retraction bilateral breasts. No nipple discharge bilateral breasts. No lymphadenopathy. No lumps palpated bilateral breasts. No complaints of pain or tenderness on exam.       Pelvic/Bimanual Pap is not indicated today per BCCCP guidelines.   Smoking History: Patient is a current smoker. Discussed smoking Cessation discussed with patient. Referred patient to the [facility] Quitline and gave resources to the free smoking cessation classes offered at [facility].   Patient Navigation: Patient education provided. Access to services provided for patient through [facility] program. Spanish interpreter Rudene Anda from [facility] provided.    Breast and Cervical Cancer Risk Assessment: Patient does not have family history of breast cancer, known genetic mutations, or radiation treatment to the chest before age 50. Patient does not have history of cervical dysplasia, immunocompromised, or DES exposure in-utero.  Risk Assessment    Risk Scores      05/06/2020 01/14/2019   Last edited by: Royston Bake, CMA Rolena Infante H, LPN   5-year risk: 0.6 % 0.5 %   Lifetime risk: 5.9 % 6 %          A: BCCCP exam without pap smear Complaint of bilateral breast pain.  P: Referred patient to the [facility] for a screening mammogram on the mobile unit. Appointment scheduled Thursday,  May 06, 2020 at 1340.  Loletta Parish, RN 05/06/2020 12:55 PM

## 2020-05-06 NOTE — Patient Instructions (Signed)
Explained breast self awareness with Levaeh Alvarez-Crespo. Patient did not need a Pap smear today due to last Pap smear and HPV typing was 06/12/2017. Let her know BCCCP will cover Pap smears and HPV typing every 5 years unless has a history of abnormal Pap smears. Referred patient to the [facility] for a screening mammogram on the mobile unit. Appointment scheduled Thursday, May 06, 2020 at 1340. Patient escorted to mobile unit following BCCCP appointment. Let patient know the [facility] will follow up with her within the next couple weeks with results of her mammogram by letter or phone. Discussed smoking Cessation discussed with patient. Referred patient to the [facility] Quitline and gave resources to the free smoking cessation classes offered at [facility]. Audianna Alvarez-Crespo verbalized understanding.  Shavonn Convey, Arvil [place], RN 12:55 PM

## 2020-05-11 ENCOUNTER — Other Ambulatory Visit: Payer: Self-pay | Admitting: Obstetrics and Gynecology

## 2020-05-11 DIAGNOSIS — R928 Other abnormal and inconclusive findings on diagnostic imaging of breast: Secondary | ICD-10-CM

## 2020-05-20 ENCOUNTER — Other Ambulatory Visit: Payer: Self-pay

## 2020-05-20 ENCOUNTER — Ambulatory Visit: Payer: No Typology Code available for payment source

## 2020-05-20 ENCOUNTER — Ambulatory Visit
Admission: RE | Admit: 2020-05-20 | Discharge: 2020-05-20 | Disposition: A | Discharge status: Home / Self Care | Payer: No Typology Code available for payment source | Source: Ambulatory Visit | Attending: Obstetrics and Gynecology | Admitting: Obstetrics and Gynecology

## 2020-05-20 ENCOUNTER — Telehealth: Payer: Self-pay

## 2020-05-20 DIAGNOSIS — R928 Other abnormal and inconclusive findings on diagnostic imaging of breast: Secondary | ICD-10-CM

## 2020-05-20 NOTE — Telephone Encounter (Signed)
Patient was given phone number to urgent tooth by front desk staff.

## 2020-05-20 NOTE — Telephone Encounter (Signed)
Patient came into clinic wanting to know if PCP can give her the information to the dental clinic that was talked about in her last ov in August. Patient states she has tired to contact the dental clinic [facility] adult dental but has had no luck with scheduling an appointment.  Please advice [phone]

## 2020-06-23 ENCOUNTER — Ambulatory Visit: Payer: Self-pay | Attending: Physician Assistant | Admitting: Family Medicine

## 2020-06-23 ENCOUNTER — Encounter: Payer: Self-pay | Admitting: Family Medicine

## 2020-06-23 ENCOUNTER — Other Ambulatory Visit: Payer: Self-pay | Admitting: Family Medicine

## 2020-06-23 ENCOUNTER — Other Ambulatory Visit: Payer: Self-pay

## 2020-06-23 DIAGNOSIS — B3731 Acute candidiasis of vulva and vagina: Secondary | ICD-10-CM

## 2020-06-23 DIAGNOSIS — B373 Candidiasis of vulva and vagina: Secondary | ICD-10-CM

## 2020-06-23 MED ORDER — FLUCONAZOLE 150 MG PO TABS: 150.0000 mg | ORAL_TABLET | Freq: Once | ORAL | 0 refills | Status: DC

## 2020-06-23 MED FILL — FLUCONAZOLE 150 MG TABLET: 150 | 3 days supply | Qty: 2 | Fill #0

## 2020-06-23 NOTE — Progress Notes (Signed)
Virtual Visit via Telephone Note  I connected with Eileen [name], on 06/23/2020 at 3:39 PM by telephone due to the COVID-19 pandemic and verified that I am speaking with the correct person using two identifiers.   Consent: I discussed the limitations, risks, security and privacy concerns of performing an evaluation and management service by telephone and the availability of in person appointments. I also discussed with the patient that there may be a patient responsible charge related to this service. The patient expressed understanding and agreed to proceed.   Location of Patient: Home  Location of Provider: Clinic   Persons participating in Telemedicine visit: Tressie Ragin Interpreter ID# [id number]- Joey Dr. Margarita Rana     History of Present Illness: Eileen [name] is a 49 year old female with a history of asthma, depression who presents today for an acute visit. She complains of vaginal itching which occurs in the interior and exterior for the last 3 months and endorses a whitish vaginal discharge which is daily. She also endorses reddish colored urine 3-4 days ago; denies use of OTC medications Denies vaginal bleeding, dysuria; has on a few occassions experienced mild flank and abdominal pain.   Past Medical History:  Diagnosis Date  . Arthritis   . Asthma   . CHF (congestive heart failure) ([place])    "unsure thinks lungs had fluid around after surgery 7 years ago"  . Chronic kidney disease    kidney infection  . Neuromuscular disorder ([place])    HERNIA  . Right inguinal hernia   . Shortness of breath    No Known Allergies  Current Outpatient Medications on File Prior to Visit  Medication Sig Dispense Refill  . albuterol (VENTOLIN HFA) 108 (90 Base) MCG/ACT inhaler Inhale 2 puffs into the lungs every 6 (six) hours as needed for wheezing or shortness of breath. 18 g 3  . ibuprofen (ADVIL) 600 MG tablet Take 1 tablet (600 mg total) by  mouth every 8 (eight) hours as needed. 60 tablet 1  . sertraline (ZOLOFT) 50 MG tablet Take 1 tablet (50 mg total) by mouth daily. 30 tablet 3  . clotrimazole (LOTRIMIN) 1 % cream Apply 1 application topically 2 (two) times daily. (Patient not taking: Reported on 05/06/2020) 30 g 0   No current facility-administered medications on file prior to visit.    Observations/Objective: Awake, alert, oriented x3 Not in acute distress  Assessment and Plan: 1. Vaginal candidiasis Advised that if symptoms do not improve she will need an office visit for a vaginal swab and urinalysis Increase fluid intake - fluconazole (DIFLUCAN) 150 MG tablet; Take 1 tablet (150 mg total) by mouth once for 1 dose. Then repeat in 72 hours  Dispense: 2 tablet; Refill: 0   Follow Up Instructions: Keep previously scheduled appoinemtent   I discussed the assessment and treatment plan with the patient. The patient was provided an opportunity to ask questions and all were answered. The patient agreed with the plan and demonstrated an understanding of the instructions.   The patient was advised to call back or seek an in-person evaluation if the symptoms worsen or if the condition fails to improve as anticipated.     I provided 16 minutes total of non-face-to-face time during this encounter including median intraservice time, reviewing previous notes, investigations, ordering medications, medical decision making, coordinating care and patient verbalized understanding at the end of the visit.     Charlott Rakes, MD, FAAFP. [facility] and [facility], [place]  06/23/2020, 3:39 PM

## 2020-06-23 NOTE — Progress Notes (Signed)
Having vaginal itching.  Reddish urine.

## 2020-07-06 ENCOUNTER — Other Ambulatory Visit: Payer: Self-pay | Admitting: Family Medicine

## 2020-07-06 ENCOUNTER — Other Ambulatory Visit: Payer: Self-pay

## 2020-07-06 ENCOUNTER — Ambulatory Visit: Payer: Self-pay | Attending: Family Medicine | Admitting: Family Medicine

## 2020-07-06 ENCOUNTER — Encounter: Payer: Self-pay | Admitting: Family Medicine

## 2020-07-06 VITALS — BP 101/64 | HR 89 | Ht 60.0 in | Wt 159.0 lb

## 2020-07-06 DIAGNOSIS — F330 Major depressive disorder, recurrent, mild: Secondary | ICD-10-CM

## 2020-07-06 DIAGNOSIS — N898 Other specified noninflammatory disorders of vagina: Secondary | ICD-10-CM

## 2020-07-06 DIAGNOSIS — D126 Benign neoplasm of colon, unspecified: Secondary | ICD-10-CM

## 2020-07-06 MED ORDER — CLOTRIMAZOLE 1 % EX CREA: 1.0000 "application " | TOPICAL_CREAM | Freq: Two times a day (BID) | CUTANEOUS | 0 refills | Status: DC

## 2020-07-06 MED ORDER — FLUCONAZOLE 150 MG PO TABS: 150.0000 mg | ORAL_TABLET | Freq: Once | ORAL | 0 refills | Status: DC

## 2020-07-06 MED ORDER — SERTRALINE HCL 50 MG PO TABS: 50.0000 mg | ORAL_TABLET | Freq: Every day | ORAL | 6 refills | Status: DC

## 2020-07-06 MED FILL — SERTRALINE HCL 50 MG TABLET: 50 | 30 days supply | Qty: 30 | Fill #0

## 2020-07-06 MED FILL — FLUCONAZOLE 150 MG TABLET: 150 | 1 days supply | Qty: 1 | Fill #0

## 2020-07-06 NOTE — Patient Instructions (Signed)
Vaginitis Vaginitis  La vaginitis es la irritacin e hinchazn (inflamacin) de la vagina. [place] bacterias y levaduras que se encuentran normalmente en la vagina crecen demasiado. Hay muchos tipos de Personnel officer. El tratamiento depende del tipo que usted tenga. Siga estas indicaciones en su casa: Estilo de vida  Mantenga el rea vaginal limpia y seca. ? Evite usar jabn. ? Enjuague la zona con agua.  No haga las siguientes cosas hasta que el mdico lo autorice: ? Personnel officer y limpiar dentro de la vagina (ducha vaginal). ? Usar tampones. ? Tener [employer].  Cuando vaya al bao, lmpiese de adelante hacia atrs.  Deje que la vagina respire. ? Use ropa interior de algodn. ? No use:  Ropa interior mientras duerme.  Pantalones ajustados.  Ropa interior tipo tanga.  Ropa interior o medias de nailon sin proteccin de algodn. ? [employer] ropa hmeda, como los trajes de bao, lo antes posible.  Use productos suaves y sin perfume. No use cosas que puedan irritar la vagina, como los suavizantes para telas. Evite los siguientes productos si son perfumados: ? Aerosoles ntimos femeninos. ? Detergentes. ? Tampones. ? Productos de higiene femenina. ? Jabones o baos de espuma.  Practique sexo seguro y use preservativos. Instrucciones generales  [employer] de venta libre y los recetados solamente como se lo haya indicado el mdico.  Si le recetaron un antibitico, tmelo o selo como se lo haya indicado el mdico. No deje de tomar ni de usar los antibiticos aunque comience a Sports administrator.  Concurra a todas las visitas de control como se lo haya indicado el mdico. Esto es importante. Comunquese con un mdico si:  Tiene dolor de vientre.  Tiene fiebre.  Sus sntomas duran ms de 2 o 3das. Solicite ayuda de inmediato si:  Tiene fiebre y los sntomas empeoran de [place] sbita. Resumen  La vaginitis es la irritacin e hinchazn de la  vagina. Puede ocurrir cuando las bacterias y levaduras que se encuentran normalmente en la vagina crecen demasiado. Hay varios tipos.  El tratamiento depende del tipo que usted tenga.  No se haga duchas vaginales, no use tampones ni tenga relaciones sexuales hasta que el mdico la autorice. Cuando pueda volver a Clinical biochemist, practique sexo seguro y use condones. Esta informacin no tiene Marine scientist el consejo del mdico. Asegrese de hacerle al mdico cualquier pregunta que tenga. Document Revised: 05/10/2017 Document Reviewed: 01/25/2012 Elsevier Patient Education  [place].

## 2020-07-06 NOTE — Progress Notes (Addendum)
Subjective:  Patient ID: Eileen [name], female    DOB: 12-Oct-1970  Age: 49 y.o. MRN: [id number]  CC: Vaginal Itching   HPI Eileen [name] is a 49 year old female with a history of asthma, depression who presents today for an acute visit. She was treated for vaginal candidiasis 2 weeks ago with Diflucan and symptoms improved then returned. She has no dysuria or urinary frequency. Complains of vaginal itching and vaginal discharge. States vaginal itching occurs in the pubic area, labia majora and labia minora. Vaginal discharge is whitish. Symptoms are  Unrelated to her periods. She is due for refill of her Zoloft for depression. In 2015 her colonoscopy revealed tubular adenoma and she was informed she needed a repeat in 5 years; she is requesting referral for colonoscopy today. Past Medical History:  Diagnosis Date  . Arthritis   . Asthma   . CHF (congestive heart failure) ([place])    "unsure thinks lungs had fluid around after surgery 7 years ago"  . Chronic kidney disease    kidney infection  . Neuromuscular disorder ([place])    HERNIA  . Right inguinal hernia   . Shortness of breath     Past Surgical History:  Procedure Laterality Date  . CESAREAN SECTION     x2  . CESAREAN SECTION    . HERNIA REPAIR  09/12/11   [facility]  . HERNIA REPAIR    . INGUINAL HERNIA REPAIR  09/12/2011   Procedure: HERNIA REPAIR INGUINAL ADULT;  Surgeon: Gayland Curry, MD;  Location: [facility];  Service: General;  Laterality: Right;  open right inguinal hernia with mesh   . TUBAL LIGATION      Family History  Problem Relation Age of Onset  . Ulcers Father        stomach  . Diabetes Brother   . Colon cancer Neg Hx   . Rectal cancer Neg Hx   . Stomach cancer Neg Hx   . Breast cancer Neg Hx     No Known Allergies  Outpatient Medications Prior to Visit  Medication Sig Dispense Refill  . albuterol (VENTOLIN HFA) 108 (90 Base) MCG/ACT inhaler Inhale 2 puffs into the lungs every 6 (six) hours  as needed for wheezing or shortness of breath. 18 g 3  . ibuprofen (ADVIL) 600 MG tablet Take 1 tablet (600 mg total) by mouth every 8 (eight) hours as needed. 60 tablet 1  . sertraline (ZOLOFT) 50 MG tablet Take 1 tablet (50 mg total) by mouth daily. 30 tablet 3  . clotrimazole (LOTRIMIN) 1 % cream Apply 1 application topically 2 (two) times daily. (Patient not taking: Reported on 05/06/2020) 30 g 0   No facility-administered medications prior to visit.     ROS Review of Systems  Constitutional: Negative for activity change, appetite change and fatigue.  HENT: Negative for congestion, sinus pressure and sore throat.   Eyes: Negative for visual disturbance.  Respiratory: Negative for cough, chest tightness, shortness of breath and wheezing.   Cardiovascular: Negative for chest pain and palpitations.  Gastrointestinal: Negative for abdominal distention, abdominal pain and constipation.  Endocrine: Negative for polydipsia.  Genitourinary: Positive for vaginal discharge. Negative for dysuria and frequency.  Musculoskeletal: Negative for arthralgias and back pain.  Skin: Negative for rash.  Neurological: Negative for tremors, light-headedness and numbness.  Hematological: Does not bruise/bleed easily.  Psychiatric/Behavioral: Negative for agitation and behavioral problems.    Objective:  BP 101/64   Pulse 89   Ht 5' (1.524 m)  Wt 159 lb (72.1 kg)   SpO2 98%   BMI 31.05 kg/m   BP/Weight 07/06/2020 04/01/276 11/26/2876  Systolic BP 676 720 947  Diastolic BP 64 60 63  Wt. (Lbs) 159 155.6 160.8  BMI 31.05 30.39 31.4      Physical Exam Constitutional:      Appearance: She is well-developed.  Neck:     Vascular: No JVD.  Cardiovascular:     Rate and Rhythm: Normal rate.     Heart sounds: Normal heart sounds. No murmur heard.   Pulmonary:     Effort: Pulmonary effort is normal.     Breath sounds: Normal breath sounds. No wheezing or rales.  Chest:     Chest wall: No  tenderness.  Abdominal:     General: Bowel sounds are normal. There is no distension.     Palpations: Abdomen is soft. There is no mass.     Tenderness: There is no abdominal tenderness.  Genitourinary:    Comments: External genitalia, vagina-normal Musculoskeletal:        General: Normal range of motion.     Right lower leg: No edema.     Left lower leg: No edema.  Neurological:     Mental Status: She is alert and oriented to person, place, and time.  Psychiatric:        Mood and Affect: Mood normal.     CMP Latest Ref Rng & Units 10/22/2019 10/19/2018 09/06/2018  Glucose 65 - 99 mg/dL 82 110(H) 87  BUN 6 - 24 mg/dL 15 10 12   Creatinine 0.57 - 1.00 mg/dL 0.78 0.72 0.84  Sodium 134 - 144 mmol/L 142 137 139  Potassium 3.5 - 5.2 mmol/L 4.2 3.5 4.5  Chloride 96 - 106 mmol/L 107(H) 109 103  CO2 20 - 29 mmol/L 20 20(L) 22  Calcium 8.7 - 10.2 mg/dL 9.3 8.7(L) 9.4  Total Protein 6.5 - 8.1 g/dL - 6.7 6.3  Total Bilirubin 0.3 - 1.2 mg/dL - 0.4 0.3  Alkaline Phos 38 - 126 U/L - 63 73  AST 15 - 41 U/L - 16 10  ALT 0 - 44 U/L - 17 12    Lipid Panel     Component Value Date/Time   CHOL 170 11/18/2015 0942   TRIG 85 11/18/2015 0942   HDL 48 11/18/2015 0942   CHOLHDL 3.5 11/18/2015 0942   CHOLHDL 4.0 Ratio 06/28/2010 2048   VLDL 26 06/28/2010 2048   LDLCALC 105 (H) 11/18/2015 0942    CBC    Component Value Date/Time   WBC 6.0 10/19/2018 1935   RBC 4.31 10/19/2018 1935   HGB 13.3 10/19/2018 1935   HGB 13.7 09/06/2018 1230   HCT 39.3 10/19/2018 1935   HCT 40.2 09/06/2018 1230   PLT 214 10/19/2018 1935   PLT 304 09/06/2018 1230   MCV 91.2 10/19/2018 1935   MCV 92 09/06/2018 1230   MCH 30.9 10/19/2018 1935   MCHC 33.8 10/19/2018 1935   RDW 14.3 10/19/2018 1935   RDW 13.1 09/06/2018 1230   LYMPHSABS 2.6 07/18/2013 1757   MONOABS 0.7 07/18/2013 1757   EOSABS 0.1 07/18/2013 1757   BASOSABS 0.0 07/18/2013 1757    Lab Results  Component Value Date   HGBA1C 5.5 11/18/2015      Assessment & Plan:  1. Vaginal discharge We will treat presumptively with Diflucan - Cervicovaginal ancillary only  2. Vaginal itching This is in the pubic region where she routinely shaves her pubic hair. Advised that itching  could be due to new hair growth Counseled on using cotton clothing, loosefitting clothing to allow aeration - clotrimazole (LOTRIMIN) 1 % cream; Apply 1 application topically 2 (two) times daily.  Dispense: 30 g; Refill: 0  3. Mild episode of recurrent major depressive disorder (HCC) Stable - sertraline (ZOLOFT) 50 MG tablet; Take 1 tablet (50 mg total) by mouth daily.  Dispense: 30 tablet; Refill: 6  4. Tubular adenoma Referred to GI for colonoscopy  Meds ordered this encounter  Medications  . fluconazole (DIFLUCAN) 150 MG tablet    Sig: Take 1 tablet (150 mg total) by mouth once for 1 dose. Then repeat in 72 hours    Dispense:  1 tablet    Refill:  0  . clotrimazole (LOTRIMIN) 1 % cream    Sig: Apply 1 application topically 2 (two) times daily.    Dispense:  30 g    Refill:  0  . sertraline (ZOLOFT) 50 MG tablet    Sig: Take 1 tablet (50 mg total) by mouth daily.    Dispense:  30 tablet    Refill:  6    Follow-up: Return in about 6 months (around 01/03/2021) for Chronic disease management.       Charlott Rakes, MD, FAAFP. [facility] and [place], [place]   07/06/2020, 4:47 PM

## 2020-07-06 NOTE — Addendum Note (Signed)
Addended by: Charlott Rakes on: 07/06/2020 04:54 PM   Modules accepted: Orders

## 2020-07-07 ENCOUNTER — Telehealth: Payer: No Typology Code available for payment source | Admitting: Family Medicine

## 2020-07-08 LAB — CERVICOVAGINAL ANCILLARY ONLY
Bacterial Vaginitis (gardnerella): POSITIVE — AB
Candida Glabrata: NEGATIVE
Candida Vaginitis: NEGATIVE
Chlamydia: NEGATIVE
Comment: NEGATIVE
Comment: NEGATIVE
Comment: NEGATIVE
Comment: NEGATIVE
Comment: NEGATIVE
Comment: NORMAL
Neisseria Gonorrhea: NEGATIVE
Trichomonas: NEGATIVE

## 2020-07-08 MED FILL — ALBUTEROL SULFATE HFA 108 (: 108 (90 BAS | 25 days supply | Qty: 9 | Fill #1

## 2020-07-09 ENCOUNTER — Telehealth: Payer: Self-pay

## 2020-07-09 ENCOUNTER — Other Ambulatory Visit: Payer: Self-pay | Admitting: Family Medicine

## 2020-07-09 MED ORDER — METRONIDAZOLE 0.75 % VA GEL: 1.0000 | Freq: Every day | VAGINAL | 0 refills | Status: DC

## 2020-07-09 MED FILL — METRONIDAZOLE 0.75 % GEL: 0.75 | 5 days supply | Qty: 70 | Fill #0

## 2020-07-09 NOTE — Telephone Encounter (Signed)
-----   Message from Charlott Rakes, MD sent at 07/09/2020  9:12 AM EST ----- Labs reveal presence of a bacterial infection-bacterial vaginosis.  I have sent a prescription for MetroGel to her pharmacy.

## 2020-07-09 NOTE — Telephone Encounter (Signed)
Patient name and DOB has been verified Patient was informed of lab results. Patient had no questions.  

## 2020-08-04 ENCOUNTER — Emergency Department (HOSPITAL_BASED_OUTPATIENT_CLINIC_OR_DEPARTMENT_OTHER)
Admission: EM | Admit: 2020-08-04 | Discharge: 2020-08-04 | Disposition: A | Discharge status: Home / Self Care | Payer: No Typology Code available for payment source | Attending: Emergency Medicine | Admitting: Emergency Medicine

## 2020-08-04 ENCOUNTER — Emergency Department (HOSPITAL_BASED_OUTPATIENT_CLINIC_OR_DEPARTMENT_OTHER): Payer: No Typology Code available for payment source

## 2020-08-04 ENCOUNTER — Other Ambulatory Visit: Payer: Self-pay

## 2020-08-04 ENCOUNTER — Encounter (HOSPITAL_BASED_OUTPATIENT_CLINIC_OR_DEPARTMENT_OTHER): Payer: Self-pay | Admitting: *Deleted

## 2020-08-04 DIAGNOSIS — N189 Chronic kidney disease, unspecified: Secondary | ICD-10-CM | POA: Insufficient documentation

## 2020-08-04 DIAGNOSIS — N3000 Acute cystitis without hematuria: Secondary | ICD-10-CM | POA: Insufficient documentation

## 2020-08-04 DIAGNOSIS — I509 Heart failure, unspecified: Secondary | ICD-10-CM | POA: Insufficient documentation

## 2020-08-04 DIAGNOSIS — J45909 Unspecified asthma, uncomplicated: Secondary | ICD-10-CM | POA: Insufficient documentation

## 2020-08-04 DIAGNOSIS — F17210 Nicotine dependence, cigarettes, uncomplicated: Secondary | ICD-10-CM | POA: Insufficient documentation

## 2020-08-04 LAB — URINALYSIS, ROUTINE W REFLEX MICROSCOPIC
Glucose, UA: NEGATIVE mg/dL
Ketones, ur: 15 mg/dL — AB
Nitrite: POSITIVE — AB
Protein, ur: >300 mg/dL — AB
Specific Gravity, Urine: 1.025 (ref 1.005–1.030)
pH: 7 (ref 5.0–8.0)

## 2020-08-04 LAB — CBC WITH DIFFERENTIAL/PLATELET
Abs Immature Granulocytes: 0.04 10*3/uL (ref 0.00–0.07)
Basophils Absolute: 0.1 10*3/uL (ref 0.0–0.1)
Basophils Relative: 0 %
Eosinophils Absolute: 0.1 10*3/uL (ref 0.0–0.5)
Eosinophils Relative: 1 %
HCT: 41.6 % (ref 36.0–46.0)
Hemoglobin: 14.3 g/dL (ref 12.0–15.0)
Immature Granulocytes: 0 %
Lymphocytes Relative: 23 %
Lymphs Abs: 2.7 10*3/uL (ref 0.7–4.0)
MCH: 32.1 pg (ref 26.0–34.0)
MCHC: 34.4 g/dL (ref 30.0–36.0)
MCV: 93.5 fL (ref 80.0–100.0)
Monocytes Absolute: 1.2 10*3/uL — ABNORMAL HIGH (ref 0.1–1.0)
Monocytes Relative: 10 %
Neutro Abs: 7.7 10*3/uL (ref 1.7–7.7)
Neutrophils Relative %: 66 %
Platelets: 311 10*3/uL (ref 150–400)
RBC: 4.45 MIL/uL (ref 3.87–5.11)
RDW: 13.3 % (ref 11.5–15.5)
WBC: 11.8 10*3/uL — ABNORMAL HIGH (ref 4.0–10.5)
nRBC: 0 % (ref 0.0–0.2)

## 2020-08-04 LAB — BASIC METABOLIC PANEL
Anion gap: 7 (ref 5–15)
BUN: 22 mg/dL — ABNORMAL HIGH (ref 6–20)
CO2: 25 mmol/L (ref 22–32)
Calcium: 8.8 mg/dL — ABNORMAL LOW (ref 8.9–10.3)
Chloride: 107 mmol/L (ref 98–111)
Creatinine, Ser: 0.89 mg/dL (ref 0.44–1.00)
GFR, Estimated: >60 mL/min (ref >60)
Glucose, Bld: 80 mg/dL (ref 70–99)
Potassium: 3.7 mmol/L (ref 3.5–5.1)
Sodium: 139 mmol/L (ref 135–145)

## 2020-08-04 LAB — URINALYSIS, MICROSCOPIC (REFLEX)
RBC / HPF: >50 RBC/hpf (ref 0–5)
WBC, UA: >50 WBC/hpf (ref 0–5)

## 2020-08-04 LAB — PREGNANCY, URINE: Preg Test, Ur: NEGATIVE

## 2020-08-04 MED ORDER — CEPHALEXIN 250 MG PO CAPS
500.0000 mg | ORAL_CAPSULE | Freq: Once | ORAL | Status: AC
  Administered 2020-08-04: 500 mg via ORAL
  Filled 2020-08-04: qty 2

## 2020-08-04 MED ORDER — CEPHALEXIN 500 MG PO CAPS: 500.0000 mg | ORAL_CAPSULE | Freq: Four times a day (QID) | ORAL | 0 refills | Status: AC

## 2020-08-04 NOTE — Discharge Instructions (Signed)
You can take Tylenol or Ibuprofen as directed for pain. You can alternate Tylenol and Ibuprofen every 4 hours. If you take Tylenol at 1pm, then you can take Ibuprofen at 5pm. Then you can take Tylenol again at 9pm.   Take antibiotics as directed. Please take all of your antibiotics until finished.  As we discussed, please have your primary care doctor recheck your urine in about a week or so to ensure that it has improved.  Return emergency department for any fever, vomiting, worsening abdominal pain or any other worsening concerning symptoms.

## 2020-08-04 NOTE — ED Triage Notes (Signed)
C/o freq painful urination x 2 days

## 2020-08-04 NOTE — ED Provider Notes (Signed)
[facility] EMERGENCY DEPARTMENT Provider Note   CSN: [id number] Arrival date & time: 08/04/20  1742     History Chief Complaint  Patient presents with  . Dysuria    Eileen [name] is a 49 y.o. female who presents for evaluation of increased urinary frequency, abdominal pain, back pain.  Patient reports that she started having increased urinary frequency yesterday.  She had not noted any hematuria.  She states that initially, she was only having pain with urination but today started having some worsening lower abdominal pain.  She also reports she has had pain noted to her back.  She states she is currently on her menstrual cycle so she does not know if that is pain from her cramps or from the urine.  She states she has not noted any hematuria.  She has had some nausea today but no vomiting.  No fevers.  Denies any chest pain, difficulty breathing.  The history is provided by the patient.       Past Medical History:  Diagnosis Date  . Arthritis   . Asthma   . CHF (congestive heart failure) ([place])    "unsure thinks lungs had fluid around after surgery 7 years ago"  . Chronic kidney disease    kidney infection  . Neuromuscular disorder ([place])    HERNIA  . Right inguinal hernia   . Shortness of breath     Patient Active Problem List   Diagnosis Date Noted  . Bilateral low back pain without sciatica 06/06/2016  . 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 Asthma 09/24/2014  . History of adenomatous polyp of colon 11/07/2013  . Chronic RLQ pain 11/07/2013  . 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 Current smoker 09/26/2013    Past Surgical History:  Procedure Laterality Date  . CESAREAN SECTION     x2  . CESAREAN SECTION    . HERNIA REPAIR  09/12/11   RIH  . HERNIA REPAIR    . INGUINAL HERNIA REPAIR  09/12/2011   Procedure: HERNIA REPAIR INGUINAL ADULT;  Surgeon: Gayland Curry, MD;  Location: [facility];  Service: General;  Laterality: Right;  open right  inguinal hernia with mesh   . TUBAL LIGATION       OB History    Gravida  5   Para  4   Term  4   Preterm  0   AB  1   Living  4     SAB  1   TAB  0   Ectopic  0   Multiple      Live Births  4           Family History  Problem Relation Age of Onset  . Ulcers Father        stomach  . Diabetes Brother   . Colon cancer Neg Hx   . Rectal cancer Neg Hx   . Stomach cancer Neg Hx   . Breast cancer Neg Hx     Social History   Tobacco Use  . Smoking status: Current Every Day Smoker    Packs/day: 0.50    Years: 20.00    Pack years: 10.00    Types: Cigarettes  . Smokeless tobacco: Never Used  . Tobacco comment: 3-8 CIGS A DAY  Vaping Use  . Vaping Use: Never used  Substance Use Topics  . Alcohol use: No  . Drug use: No    Home Medications Prior to Admission medications   Medication Sig  Start Date End Date Taking? Authorizing Provider  albuterol (VENTOLIN HFA) 108 (90 Base) MCG/ACT inhaler Inhale 2 puffs into the lungs every 6 (six) hours as needed for wheezing or shortness of breath. 03/31/20   Charlott Rakes, MD  cephALEXin (KEFLEX) 500 MG capsule Take 1 capsule (500 mg total) by mouth 4 (four) times daily for 7 days. 08/04/20 08/11/20  Volanda Napoleon, PA-C  clotrimazole (LOTRIMIN) 1 % cream Apply 1 application topically 2 (two) times daily. 07/06/20   Charlott Rakes, MD  ibuprofen (ADVIL) 600 MG tablet Take 1 tablet (600 mg total) by mouth every 8 (eight) hours as needed. 03/31/20   Charlott Rakes, MD  metroNIDAZOLE (METROGEL VAGINAL) 0.75 % vaginal gel Place 1 Applicatorful vaginally at bedtime. 07/09/20   Charlott Rakes, MD  sertraline (ZOLOFT) 50 MG tablet Take 1 tablet (50 mg total) by mouth daily. 07/06/20   Charlott Rakes, MD    Allergies    Patient has no known allergies.  Review of Systems   Review of Systems  Constitutional: Negative for fever.  Respiratory: Negative for cough and shortness of breath.   Cardiovascular: Negative for chest  pain.  Gastrointestinal: Positive for abdominal pain and nausea. Negative for vomiting.  Genitourinary: Positive for dysuria, frequency and urgency. Negative for hematuria.  Neurological: Negative for headaches.  All other systems reviewed and are negative.   Physical Exam Updated Vital Signs BP 102/72 (BP Location: Left Arm)   Pulse 72   Temp 98 F (36.7 C) (Oral)   Resp 16   Ht 5\' 1"  (1.549 m)   Wt 70.3 kg   LMP 08/03/2020   SpO2 98%   BMI 29.29 kg/m   Physical Exam Vitals and nursing note reviewed.  Constitutional:      Appearance: Normal appearance. She is well-developed.  HENT:     Head: Normocephalic and atraumatic.  Eyes:     General: Lids are normal.     Conjunctiva/sclera: Conjunctivae normal.     Pupils: Pupils are equal, round, and reactive to light.  Cardiovascular:     Rate and Rhythm: Normal rate and regular rhythm.     Pulses: Normal pulses.     Heart sounds: Normal heart sounds. No murmur heard.  No friction rub. No gallop.   Pulmonary:     Effort: Pulmonary effort is normal.     Breath sounds: Normal breath sounds.     Comments: Lungs clear to auscultation bilaterally.  Symmetric chest rise.  No wheezing, rales, rhonchi. Abdominal:     Palpations: Abdomen is soft. Abdomen is not rigid.     Tenderness: There is abdominal tenderness in the suprapubic area. There is right CVA tenderness and left CVA tenderness. There is no guarding.     Comments: Abdomen soft, nondistended.  Diffuse tenderness in the suprapubic region.  No rigidity, guarding.  Bilateral CVA tenderness noted.  Musculoskeletal:        General: Normal range of motion.     Cervical back: Full passive range of motion without pain.  Skin:    General: Skin is warm and dry.     Capillary Refill: Capillary refill takes less than 2 seconds.  Neurological:     Mental Status: She is alert and oriented to person, place, and time.  Psychiatric:        Speech: Speech normal.     ED Results /  Procedures / Treatments   Labs (all labs ordered are listed, but only abnormal results are displayed) Labs Reviewed  URINALYSIS, ROUTINE W REFLEX MICROSCOPIC - Abnormal; Notable for the following components:      Result Value   APPearance TURBID (*)    Hgb urine dipstick LARGE (*)    Bilirubin Urine SMALL (*)    Ketones, ur 15 (*)    Protein, ur >300 (*)    Nitrite POSITIVE (*)    Leukocytes,Ua MODERATE (*)    All other components within normal limits  URINALYSIS, MICROSCOPIC (REFLEX) - Abnormal; Notable for the following components:   Bacteria, UA MANY (*)    Non Squamous Epithelial PRESENT (*)    All other components within normal limits  BASIC METABOLIC PANEL - Abnormal; Notable for the following components:   BUN 22 (*)    Calcium 8.8 (*)    All other components within normal limits  CBC WITH DIFFERENTIAL/PLATELET - Abnormal; Notable for the following components:   WBC 11.8 (*)    Monocytes Absolute 1.2 (*)    All other components within normal limits  URINE CULTURE  PREGNANCY, URINE    EKG None  Radiology CT Renal Stone Study  Result Date: 08/04/2020 CLINICAL DATA:  Bilateral flank pain with frequent urination EXAM: CT ABDOMEN AND PELVIS WITHOUT CONTRAST TECHNIQUE: Multidetector CT imaging of the abdomen and pelvis was performed following the standard protocol without IV contrast. COMPARISON:  CT 04/25/2016 FINDINGS: Lower chest: Lung bases demonstrate no acute consolidation or effusion. Normal cardiac size. Hepatobiliary: No focal liver abnormality is seen. No gallstones, gallbladder wall thickening, or biliary dilatation. Pancreas: Unremarkable. No pancreatic ductal dilatation or surrounding inflammatory changes. Spleen: Normal in size without focal abnormality. Adrenals/Urinary Tract: Adrenal glands are normal. Kidneys show no hydronephrosis. Slightly thick-walled urinary bladder with mild perivesical stranding. Stomach/Bowel: Stomach is within normal limits. Appendix  appears normal. No evidence of bowel wall thickening, distention, or inflammatory changes. Mild sigmoid colon diverticula without acute inflammatory process. Vascular/Lymphatic: No significant vascular findings are present. No enlarged abdominal or pelvic lymph nodes. Reproductive: Uterus unremarkable. No adnexal mass. Small low-attenuation lesions in the right greater than left labia suggestive of labial cysts. Other: Negative for free air or free fluid. Musculoskeletal: Mild sclerosis within the right posterior ilium, unchanged compared with priors and therefore felt benign. IMPRESSION: 1. Negative for hydronephrosis or ureteral stone. 2. Slightly thick-walled urinary bladder with mild perivesical stranding, suggestive of cystitis. 3. Mild sigmoid colon diverticular disease without acute inflammatory process. Electronically Signed   By: Donavan Foil M.D.   On: 08/04/2020 20:00    Procedures Procedures (including critical care time)  Medications Ordered in ED Medications  cephALEXin (KEFLEX) capsule 500 mg (has no administration in time range)    ED Course  I have reviewed the triage vital signs and the nursing notes.  Pertinent labs & imaging results that were available during my care of the patient were reviewed by me and considered in my medical decision making (see chart for details).    MDM Rules/Calculators/A&P                          49 year old female who presents for evaluation of increased urinary frequency, dysuria, abdominal pain.  No fevers, vomiting.  On initial arrival, she is afebrile, nontoxic-appearing.  Signs are stable.  On exam, she has tenderness in the suprapubic region and has bilateral CVA.  No rigidity, guarding.  Urine ordered at triage.  Concern for UTI versus pyelonephritis.  UA shows large hemoglobin, ketones, protein, nitrites, leukocytes, pyuria, WBC clumps.  Urine  pregnancy is negative.  She has had kidney infections in the past.  I reviewed her previous  urines.  No evidence of proteinuria.  Will obtain basic blood work to evaluate your kidney function.  CBC shows slight leukocytosis of 11.8.  BMP shows BUN of 22, creatinine 0.89.  CT scan shows   CT scan shows small no hydronephrosis or ureteral stone.  She has a slightly thick-walled urinary bladder with mild paracervical stranding suggestive of cystitis.  Discussed results with patient using the language interpreter.  At this time, patient is hemodynamically stable and has not had any vomiting.  We will plan to treat with antibiotics.  Patient with no known drug allergies.  Given that she is having some CVA tenderness, we will treat as possible pyelonephritis. At this time, patient exhibits no emergent life-threatening condition that require further evaluation in ED. Patient had ample opportunity for questions and discussion. All patient's questions were answered with full understanding. Strict return precautions discussed. Patient expresses understanding and agreement to plan.    Portions of this note were generated with Lobbyist. Dictation errors may occur despite best attempts at proofreading.    Final Clinical Impression(s) / ED Diagnoses Final diagnoses:  Acute cystitis without hematuria    Rx / DC Orders ED Discharge Orders         Ordered    cephALEXin (KEFLEX) 500 MG capsule  4 times daily        08/04/20 2114           Desma Mcgregor 08/04/20 2120    Truddie Hidden, MD 08/04/20 2224

## 2020-08-07 LAB — URINE CULTURE: Culture: >=100000 — AB

## 2020-10-11 ENCOUNTER — Ambulatory Visit (AMBULATORY_SURGERY_CENTER): Payer: Self-pay | Admitting: *Deleted

## 2020-10-11 ENCOUNTER — Other Ambulatory Visit: Payer: Self-pay | Admitting: Internal Medicine

## 2020-10-11 ENCOUNTER — Other Ambulatory Visit: Payer: Self-pay

## 2020-10-11 VITALS — Ht 60.0 in | Wt 155.0 lb

## 2020-10-11 DIAGNOSIS — Z86010 Personal history of colonic polyps: Secondary | ICD-10-CM

## 2020-10-11 MED ORDER — SUPREP BOWEL PREP KIT 17.5-3.13-1.6 GM/177ML PO SOLN: 1.0000 | Freq: Once | ORAL | 0 refills | Status: DC

## 2020-10-11 MED FILL — SUPREP BOWEL PREP KIT: 17.5-3.13-1 | 1 days supply | Qty: 354 | Fill #0

## 2020-10-11 NOTE — Progress Notes (Signed)
No egg or soy allergy known to patient  No issues with past sedation with any surgeries or procedures No intubation problems in the past  No FH of Malignant Hyperthermia No diet pills per patient No home 02 use per patient  No blood thinners per patient  Pt denies issues with constipation  No A fib or A flutter  EMMI video to pt or via [place] 19 guidelines implemented in PV today with Pt and RN  [facility] Intrpreter in PV with pt today  Pt is fully vaccinated  for Covid    Pt denies loose or missing teeth, denies dentures, she has a  Partial crown- not removable  dental implants, capped  She has bonded teeth in the front and gum line and molars    Due to the COVID-19 pandemic we are asking patients to follow certain guidelines.  Pt aware of COVID protocols and [facility] guidelines

## 2020-10-20 ENCOUNTER — Encounter: Payer: Self-pay | Admitting: Internal Medicine

## 2020-10-21 ENCOUNTER — Ambulatory Visit (AMBULATORY_SURGERY_CENTER): Payer: Self-pay | Admitting: Internal Medicine

## 2020-10-21 ENCOUNTER — Other Ambulatory Visit: Payer: Self-pay

## 2020-10-21 ENCOUNTER — Encounter: Payer: Self-pay | Admitting: Internal Medicine

## 2020-10-21 VITALS — BP 93/63 | HR 59 | Temp 96.9°F | Resp 12 | Ht 60.0 in | Wt 155.0 lb

## 2020-10-21 DIAGNOSIS — Z86010 Personal history of colonic polyps: Secondary | ICD-10-CM

## 2020-10-21 DIAGNOSIS — D122 Benign neoplasm of ascending colon: Secondary | ICD-10-CM

## 2020-10-21 MED ORDER — SODIUM CHLORIDE 0.9 % IV SOLN: 500.0000 mL | Freq: Once | INTRAVENOUS | Status: DC

## 2020-10-21 NOTE — Patient Instructions (Addendum)
Handouts on polyps and diverticulosis given to you today  Await pathology results    USTED TUVO UN PROCEDIMIENTO ENDOSCPICO HOY EN EL  ENDOSCOPY CENTER:   Lea el informe del procedimiento que se le entreg para cualquier pregunta especfica sobre lo que se Primary school teacher.  Si el informe del examen no responde a sus preguntas, por favor llame a su gastroenterlogo para aclararlo.  Si usted solicit que no se le den [employer] de lo que se Estate manager/land agent en su procedimiento al [employer] va a cuidar, entonces el informe del procedimiento se ha incluido en un sobre sellado para que usted lo revise despus cuando le sea ms conveniente.   LO QUE PUEDE ESPERAR: Algunas sensaciones de hinchazn en el abdomen.  Puede tener ms gases de lo normal.  El caminar puede ayudarle a eliminar el aire que se le puso en el tracto gastrointestinal durante el procedimiento y reducir la hinchazn.  Si le hicieron una endoscopia inferior (como una colonoscopia o una sigmoidoscopia flexible), podra notar manchas de sangre en las heces fecales o en el papel higinico.  Si se someti a una preparacin intestinal para su procedimiento, es posible que no tenga una evacuacin intestinal normal durante [employer].   Tenga en cuenta:  Es posible que note un poco de irritacin y congestin en la nariz o algn drenaje.  Esto es debido al oxgeno [employer] durante su procedimiento.  No hay que preocuparse y esto debe desaparecer ms o Scientist, research (medical).   SNTOMAS PARA REPORTAR INMEDIATAMENTE:  Despus de una endoscopia inferior (colonoscopia o sigmoidoscopia flexible):  Cantidades excesivas de sangre en las heces fecales  Sensibilidad significativa o empeoramiento de los dolores abdominales   Hinchazn aguda del abdomen que antes no tena   Fiebre de 100F o ms   Despus de la endoscopia superior (EGD)  Vmitos de Biochemist, clinical o material como caf molido   Dolor en el pecho o dolor debajo de los omplatos que  antes no tena   Dolor o dificultad persistente para tragar  Falta de aire que antes no tena   Fiebre de 100F o ms  Heces fecales negras y pegajosas   Para asuntos urgentes o de Freight forwarder, puede comunicarse con un gastroenterlogo a cualquier hora llamando al [phone].  DIETA:  Recomendamos una comida pequea al principio, pero luego puede continuar con su dieta normal.  Tome muchos lquidos, Teacher, adult education las bebidas alcohlicas durante 24 horas.    ACTIVIDAD:  Debe planear tomarse las cosas con calma por el resto del da y no debe CONDUCIR ni usar maquinaria pesada Programmer, applications (debido a los medicamentos de sedacin utilizados durante el examen).     SEGUIMIENTO: Nuestro personal llamar al nmero que aparece en su historial al siguiente da hbil de su procedimiento para ver cmo se siente y para responder cualquier pregunta o inquietud que pueda tener con respecto a la informacin que se le dio despus del procedimiento. Si no podemos contactarle, le dejaremos un mensaje.  Sin embargo, si se siente bien y no tiene Paediatric nurse, no es necesario que nos devuelva la llamada.  Asumiremos que ha regresado a sus actividades diarias normales sin incidentes. Si se le tomaron algunas biopsias, le contactaremos por telfono o por carta en las prximas 3 semanas.  Si no ha sabido [employer] biopsias en el transcurso de 3 semanas, por favor llmenos al [phone].   FIRMAS/CONFIDENCIALIDAD: Usted y/o el acompaante que le cuide [place]  firmado documentos que se ingresarn en su historial mdico electrnico.  Estas firmas atestiguan el hecho de que la informacin anterior

## 2020-10-21 NOTE — Progress Notes (Signed)
Called to room to assist during endoscopic procedure.  Patient ID and intended procedure confirmed with present staff. Received instructions for my participation in the procedure from the performing physician.  

## 2020-10-21 NOTE — Progress Notes (Signed)
VS-JD  Pt's states no medical or surgical changes since previsit or office visit.   Interpreter used today at the [facility] for this pt.  Interpreter's name is-Karina

## 2020-10-21 NOTE — Progress Notes (Signed)
Report to PACU, RN, vss, BBS= Clear.  

## 2020-10-21 NOTE — Op Note (Signed)
[facility] Patient Name: Eileen [name] Procedure Date: 10/21/2020 1:40 PM MRN: [id number] Endoscopist: Jerene Bears , MD Age: 50 Referring MD:  Date of Birth: 12-Feb-1971 Gender: Female Account #: [id number] Procedure:                Colonoscopy Indications:              High risk colon cancer surveillance: Personal                            history of non-advanced adenoma, Last colonoscopy:                            January 2015 Medicines:                Monitored Anesthesia Care Procedure:                Pre-Anesthesia Assessment:                           - Prior to the procedure, a History and Physical                            was performed, and patient medications and                            allergies were reviewed. The patient's tolerance of                            previous anesthesia was also reviewed. The risks                            and benefits of the procedure and the sedation                            options and risks were discussed with the patient.                            All questions were answered, and informed consent                            was obtained. Prior Anticoagulants: The patient has                            taken no previous anticoagulant or antiplatelet                            agents. ASA Grade Assessment: II - A patient with                            mild systemic disease. After reviewing the risks                            and benefits, the patient was deemed in  satisfactory condition to undergo the procedure.                           After obtaining informed consent, the colonoscope                            was passed under direct vision. Throughout the                            procedure, the patient's blood pressure, pulse, and                            oxygen saturations were monitored continuously. The                            Olympus PCF-H190DL (#[id number]) Colonoscope  was                            introduced through the anus and advanced to the                            cecum, identified by appendiceal orifice and                            ileocecal valve. The colonoscopy was performed                            without difficulty. The patient tolerated the                            procedure well. The quality of the bowel                            preparation was good. The ileocecal valve,                            appendiceal orifice, and rectum were photographed. Scope In: 1:44:25 PM Scope Out: 1:57:31 PM Scope Withdrawal Time: 0 hours 10 minutes 13 seconds  Total Procedure Duration: 0 hours 13 minutes 6 seconds  Findings:                 The digital rectal exam was normal.                           A 6 mm polyp was found in the ascending colon. The                            polyp was sessile. The polyp was removed with a                            cold snare. Resection and retrieval were complete.                           Multiple small and large-mouthed diverticula were  found from cecum to sigmoid colon.                           The retroflexed view of the distal rectum and anal                            verge was normal and showed no anal or rectal                            abnormalities. Complications:            No immediate complications. Estimated Blood Loss:     Estimated blood loss was minimal. Impression:               - One 6 mm polyp in the ascending colon, removed                            with a cold snare. Resected and retrieved.                           - Diverticulosis from cecum to sigmoid colon.                           - The distal rectum and anal verge are normal on                            retroflexion view. Recommendation:           - Patient has a contact number available for                            emergencies. The signs and symptoms of potential                             delayed complications were discussed with the                            patient. Return to normal activities tomorrow.                            Written discharge instructions were provided to the                            patient.                           - Resume previous diet.                           - Continue present medications.                           - Await pathology results.                           - Repeat colonoscopy is recommended for  surveillance. The colonoscopy date will be                            determined after pathology results from today's                            exam become available for review. Jerene Bears, MD 10/21/2020 1:59:43 PM This report has been signed electronically.

## 2020-10-25 ENCOUNTER — Telehealth: Payer: Self-pay | Admitting: *Deleted

## 2020-10-25 ENCOUNTER — Ambulatory Visit: Payer: No Typology Code available for payment source

## 2020-10-25 NOTE — Telephone Encounter (Signed)
Follow up call made. 

## 2020-10-25 NOTE — Telephone Encounter (Signed)
Attempted 2nd f/u phone call. No answer. Left message.  °

## 2020-10-29 ENCOUNTER — Encounter: Payer: Self-pay | Admitting: Internal Medicine

## 2020-10-29 ENCOUNTER — Telehealth: Payer: Self-pay | Admitting: Family Medicine

## 2020-10-29 NOTE — Telephone Encounter (Signed)
Copied from [place] [phone]. Topic: Appointment Scheduling - Scheduling Inquiry for Clinic >> Oct 25, 2020 12:43 PM Eileen [name] wrote: Reason for CRM: Pt  is canceling Eileen [name] appt today does not have documents want a FU resch at [phone]. >> Oct 27, 2020  2:29 PM [name], Eileen Maples wrote: Called patient using interpreter services advising her to call [phone] to reschedule financial appointment.   >> Oct 25, 2020  1:45 PM [name], Eileen wrote: Cancelled appt and placed call to patient using interpreter services advising patient to call [phone] to schedule.    Called patient x4 and LVM advising patient to call [phone] to reschedule.

## 2020-11-09 ENCOUNTER — Other Ambulatory Visit: Payer: Self-pay

## 2020-11-09 ENCOUNTER — Ambulatory Visit: Payer: Self-pay | Attending: Family Medicine

## 2020-12-08 ENCOUNTER — Emergency Department (HOSPITAL_COMMUNITY)
Admission: EM | Admit: 2020-12-08 | Discharge: 2020-12-09 | Disposition: A | Discharge status: Home / Self Care | Payer: No Typology Code available for payment source | Attending: Emergency Medicine | Admitting: Emergency Medicine

## 2020-12-08 ENCOUNTER — Other Ambulatory Visit: Payer: Self-pay

## 2020-12-08 ENCOUNTER — Encounter (HOSPITAL_COMMUNITY): Payer: Self-pay | Admitting: Emergency Medicine

## 2020-12-08 ENCOUNTER — Emergency Department (HOSPITAL_COMMUNITY): Payer: No Typology Code available for payment source

## 2020-12-08 DIAGNOSIS — J4521 Mild intermittent asthma with (acute) exacerbation: Secondary | ICD-10-CM | POA: Insufficient documentation

## 2020-12-08 DIAGNOSIS — I509 Heart failure, unspecified: Secondary | ICD-10-CM | POA: Insufficient documentation

## 2020-12-08 DIAGNOSIS — N189 Chronic kidney disease, unspecified: Secondary | ICD-10-CM | POA: Insufficient documentation

## 2020-12-08 DIAGNOSIS — F17210 Nicotine dependence, cigarettes, uncomplicated: Secondary | ICD-10-CM | POA: Insufficient documentation

## 2020-12-08 DIAGNOSIS — J4520 Mild intermittent asthma, uncomplicated: Secondary | ICD-10-CM

## 2020-12-08 DIAGNOSIS — R42 Dizziness and giddiness: Secondary | ICD-10-CM | POA: Insufficient documentation

## 2020-12-08 LAB — BASIC METABOLIC PANEL
Anion gap: 6 (ref 5–15)
BUN: 14 mg/dL (ref 6–20)
CO2: 25 mmol/L (ref 22–32)
Calcium: 9.2 mg/dL (ref 8.9–10.3)
Chloride: 107 mmol/L (ref 98–111)
Creatinine, Ser: 0.95 mg/dL (ref 0.44–1.00)
GFR, Estimated: >60 mL/min (ref >60)
Glucose, Bld: 119 mg/dL — ABNORMAL HIGH (ref 70–99)
Potassium: 3.7 mmol/L (ref 3.5–5.1)
Sodium: 138 mmol/L (ref 135–145)

## 2020-12-08 LAB — CBC
HCT: 38 % (ref 36.0–46.0)
Hemoglobin: 12.8 g/dL (ref 12.0–15.0)
MCH: 31.4 pg (ref 26.0–34.0)
MCHC: 33.7 g/dL (ref 30.0–36.0)
MCV: 93.1 fL (ref 80.0–100.0)
Platelets: 270 10*3/uL (ref 150–400)
RBC: 4.08 MIL/uL (ref 3.87–5.11)
RDW: 13.3 % (ref 11.5–15.5)
WBC: 9.1 10*3/uL (ref 4.0–10.5)
nRBC: 0 % (ref 0.0–0.2)

## 2020-12-08 LAB — I-STAT BETA HCG BLOOD, ED (MC, WL, AP ONLY): I-stat hCG, quantitative: <5 m[IU]/mL (ref <5)

## 2020-12-08 LAB — TROPONIN I (HIGH SENSITIVITY): Troponin I (High Sensitivity): <2 ng/L (ref <18)

## 2020-12-08 NOTE — ED Provider Notes (Signed)
[facility] EMERGENCY DEPARTMENT Provider Note   CSN: [id number] Arrival date & time: 12/08/20  2129     History Chief Complaint  Patient presents with  . Shortness of Breath  . Chest Pain    Eileen [name] is a 50 y.o. female.  Patient presents to the emergency department for evaluation of chest pain, shortness of breath and dizziness.  Symptoms ongoing for at least a week.        Past Medical History:  Diagnosis Date  . Allergy    Pollen   . Arthritis   . Asthma   . CHF (congestive heart failure) ([place])    "unsure thinks lungs had fluid around after surgery 7 years ago"  . Chronic kidney disease    kidney infection  . Neuromuscular disorder ([place])    HERNIA  . Right inguinal hernia   . Shortness of breath     Patient Active Problem List   Diagnosis Date Noted  . Bilateral low back pain without sciatica 06/06/2016  . 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 Asthma 09/24/2014  . History of adenomatous polyp of colon 11/07/2013  . Chronic RLQ pain 11/07/2013  . 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 Current smoker 09/26/2013    Past Surgical History:  Procedure Laterality Date  . CESAREAN SECTION     x2  . CESAREAN SECTION    . COLONOSCOPY    . HERNIA REPAIR  09/12/11   RIH  . HERNIA REPAIR    . INGUINAL HERNIA REPAIR  09/12/2011   Procedure: HERNIA REPAIR INGUINAL ADULT;  Surgeon: Gayland Curry, MD;  Location: [place];  Service: General;  Laterality: Right;  open right inguinal hernia with mesh   . POLYPECTOMY    . TUBAL LIGATION       OB History    Gravida  5   Para  4   Term  4   Preterm  0   AB  1   Living  4     SAB  1   IAB  0   Ectopic  0   Multiple      Live Births  4           Family History  Problem Relation Age of Onset  . Ulcers Father        stomach  . Diabetes Brother   . Esophageal cancer Nephew   . Diabetes Brother   . Colon cancer Neg Hx   . Rectal cancer Neg Hx   . Stomach  cancer Neg Hx   . Breast cancer Neg Hx   . Colon polyps Neg Hx     Social History   Tobacco Use  . Smoking status: Current Every Day Smoker    Packs/day: 0.50    Years: 20.00    Pack years: 10.00    Types: Cigarettes  . Smokeless tobacco: Never Used  . Tobacco comment: 3-8 CIGS A DAY  Vaping Use  . Vaping Use: Never used  Substance Use Topics  . Alcohol use: No  . Drug use: No    Home Medications Prior to Admission medications   Medication Sig Start Date End Date Taking? Authorizing Provider  acetaminophen (TYLENOL) 500 MG tablet Take 500 mg by mouth every 6 (six) hours as needed for mild pain, headache or fever.   Yes [provider]  cetirizine (ZYRTEC) 10 MG tablet Take 10 mg by mouth daily as needed for allergies.   Yes [provider]  predniSONE (DELTASONE) 20 MG tablet Take 2 tablets (40 mg total) by mouth daily with breakfast. 12/09/20  Yes Jadynn Epping, Gwenyth Allegra, MD  albuterol (VENTOLIN HFA) 108 (90 Base) MCG/ACT inhaler INHALE 2 PUFFS INTO THE LUNGS EVERY 6 (SIX) HOURS AS NEEDED FOR WHEEZING OR SHORTNESS OF BREATH. 12/09/20 12/09/21  Orpah Greek, MD  fluconazole (DIFLUCAN) 150 MG tablet TAKE 1 TABLET (150 MG TOTAL) BY MOUTH ONCE FOR 1 DOSE. THEN REPEAT IN 72 HOURS Patient not taking: No sig reported 07/06/20 07/06/21  Charlott Rakes, MD  fluconazole (DIFLUCAN) 150 MG tablet TAKE 1 TABLET (150 MG TOTAL) BY MOUTH ONCE FOR 1 DOSE. THEN REPEAT IN 72 HOURS Patient not taking: No sig reported 06/23/20 06/23/21  Charlott Rakes, MD  ibuprofen (ADVIL) 600 MG tablet Take 1 tablet (600 mg total) by mouth every 8 (eight) hours as needed. Patient not taking: Reported on 12/09/2020 03/31/20   Charlott Rakes, MD  Na Sulfate-K Sulfate-Mg Sulf 17.5-3.13-1.6 GM/177ML SOLN TAKE 1 KIT BY MOUTH ONCE FOR 1 DOSE. SUPREP AS DIRECTED. NO SUBSTITUTIONS Patient not taking: Reported on 12/09/2020 10/11/20 10/11/21  Jerene Bears, MD  sertraline (ZOLOFT) 50 MG tablet TAKE 1  TABLET (50 MG TOTAL) BY MOUTH DAILY. Patient not taking: No sig reported 07/06/20 07/06/21  Charlott Rakes, MD    Allergies    Patient has no known allergies.  Review of Systems   Review of Systems  Respiratory: Positive for shortness of breath.   Cardiovascular: Positive for chest pain.  Neurological: Positive for dizziness.  All other systems reviewed and are negative.   Physical Exam Updated Vital Signs BP 105/70   Pulse 65   Temp 97.9 F (36.6 C) (Oral)   Resp 14   LMP 11/23/2020   SpO2 96%   Physical Exam Vitals and nursing note reviewed.  Constitutional:      General: She is not in acute distress.    Appearance: Normal appearance. She is well-developed.  HENT:     Head: Normocephalic and atraumatic.     Right Ear: Hearing normal.     Left Ear: Hearing normal.     Nose: Nose normal.  Eyes:     Conjunctiva/sclera: Conjunctivae normal.     Pupils: Pupils are equal, round, and reactive to light.  Cardiovascular:     Rate and Rhythm: Regular rhythm.     Heart sounds: S1 normal and S2 normal. No murmur heard. No friction rub. No gallop.   Pulmonary:     Effort: Pulmonary effort is normal. No respiratory distress.     Breath sounds: Normal breath sounds.  Chest:     Chest wall: No tenderness.  Abdominal:     General: Bowel sounds are normal.     Palpations: Abdomen is soft.     Tenderness: There is no abdominal tenderness. There is no guarding or rebound. Negative signs include Murphy's sign and McBurney's sign.     Hernia: No hernia is present.  Musculoskeletal:        General: Normal range of motion.     Cervical back: Normal range of motion and neck supple.  Skin:    General: Skin is warm and dry.     Findings: No rash.  Neurological:     Mental Status: She is alert and oriented to person, place, and time.     GCS: GCS eye subscore is 4. GCS verbal subscore is 5. GCS motor subscore is 6.     Cranial Nerves: No cranial nerve  deficit.     Sensory: No  sensory deficit.     Coordination: Coordination normal.  Psychiatric:        Speech: Speech normal.        Behavior: Behavior normal.        Thought Content: Thought content normal.     ED Results / Procedures / Treatments   Labs (all labs ordered are listed, but only abnormal results are displayed) Labs Reviewed  BASIC METABOLIC PANEL - Abnormal; Notable for the following components:      Result Value   Glucose, Bld 119 (*)    All other components within normal limits  CBC  D-DIMER, QUANTITATIVE  BRAIN NATRIURETIC PEPTIDE  I-STAT BETA HCG BLOOD, ED ([facility], [facility], [facility] ONLY)  TROPONIN I (HIGH SENSITIVITY)  TROPONIN I (HIGH SENSITIVITY)    EKG EKG Interpretation  Date/Time:  Wednesday December 08 2020 22:01:40 EDT Ventricular Rate:  64 PR Interval:  150 QRS Duration: 74 QT Interval:  396 QTC Calculation: 408 R Axis:   66 Text Interpretation: Normal sinus rhythm Cannot rule out Anterior infarct , age undetermined Abnormal ECG Confirmed by Orpah Greek [phone]) on 12/08/2020 11:15:36 PM   Radiology DG Chest 2 View  Result Date: 12/08/2020 CLINICAL DATA:  Shortness of breath and left-sided chest pain. EXAM: CHEST - 2 VIEW COMPARISON:  October 19, 2018 FINDINGS: Mild, diffuse, chronic appearing increased lung markings are seen. There is no evidence of a pleural effusion or pneumothorax. The heart size and mediastinal contours are within normal limits. The visualized skeletal structures are unremarkable. IMPRESSION: No active cardiopulmonary disease. Electronically Signed   By: Virgina Norfolk M.D.   On: 12/08/2020 22:34    Procedures Procedures   Medications Ordered in ED Medications  albuterol (VENTOLIN HFA) 108 (90 Base) MCG/ACT inhaler 2 puff (2 puffs Inhalation Given 12/09/20 0150)  predniSONE (DELTASONE) tablet 60 mg (60 mg Oral Given 12/09/20 0231)    ED Course  I have reviewed the triage vital signs and the nursing notes.  Pertinent labs & imaging results that  were available during my care of the patient were reviewed by me and considered in my medical decision making (see chart for details).    MDM Rules/Calculators/A&P                          Patient presents with complaints of shortness of breath and left-sided chest pain has been ongoing for approximately 1 week.  Patient stopped smoking several days before that.  Patient without any clear cardiac risk factors other than recent smoking.  She does have a history of asthma.  No wheezing currently but this is likely the cause of her symptoms.  In the recent setting of quitting smoking she is probably having increased pulmonary secretions and intermittent bronchospasm.  Her work-up in the department has been very reassuring.  She has a normal cardiac exam.  EKG unremarkable, troponin negative x2.  BNP normal.  D-dimer normal.  No tachycardia, tachypnea or hypoxia noted.  Chest x-ray is clear.  Patient reassured, treat for bronchospasm and bronchitis type symptoms with prednisone, albuterol.  Follow-up with primary care.  Final Clinical Impression(s) / ED Diagnoses Final diagnoses:  Mild intermittent asthma with exacerbation    Rx / DC Orders ED Discharge Orders         Ordered    albuterol (VENTOLIN HFA) 108 (90 Base) MCG/ACT inhaler  Every 6 hours PRN  12/09/20 0220    predniSONE (DELTASONE) 20 MG tablet  Daily with breakfast        12/09/20 0220           Orpah Greek, MD 12/09/20 [phone]

## 2020-12-08 NOTE — ED Triage Notes (Signed)
Patient with one week history of shortness of breath and left chest pain.  Patient states that she also has 10 days of dizziness.  She does have some nausea, no vomiting.

## 2020-12-08 NOTE — ED Provider Notes (Deleted)
MSE was initiated and I personally evaluated the patient and placed orders (if any) at  10:25 PM on December 08, 2020.   Patient to ED with upper abdominal pain. The pain is worse with breathing, walking or talking. Nausea without vomiting. No fever, cough, congestion. Pain limited to epigastrium and radiates to her back.   Today's Vitals   12/08/20 2138 12/08/20 2209  BP: 105/76   Pulse: 66   Resp: 20   Temp: 98.1 F (36.7 C)   TempSrc: Oral   SpO2: 99%   PainSc:  7    There is no height or weight on file to calculate BMI.  Abdomen soft, no tenderness to palpation.  No respiratory distress  The patient appears stable so that the remainder of the MSE may be completed by another provider.   Charlann Lange, PA-C 12/08/20 2227

## 2020-12-08 NOTE — ED Notes (Signed)
Lab to add on BNP.  °

## 2020-12-09 ENCOUNTER — Other Ambulatory Visit: Payer: Self-pay

## 2020-12-09 LAB — TROPONIN I (HIGH SENSITIVITY): Troponin I (High Sensitivity): <2 ng/L (ref <18)

## 2020-12-09 LAB — BRAIN NATRIURETIC PEPTIDE: B Natriuretic Peptide: 25.2 pg/mL (ref 0.0–100.0)

## 2020-12-09 LAB — D-DIMER, QUANTITATIVE: D-Dimer, Quant: <0.27 ug/mL-FEU (ref 0.00–0.50)

## 2020-12-09 MED ORDER — PREDNISONE 20 MG PO TABS
40.0000 mg | ORAL_TABLET | Freq: Every day | ORAL | 0 refills | Status: DC
  Filled 2020-12-09: qty 10, 5d supply, fill #0

## 2020-12-09 MED ORDER — PREDNISONE 20 MG PO TABS
60.0000 mg | ORAL_TABLET | Freq: Once | ORAL | Status: AC
  Administered 2020-12-09: 60 mg via ORAL
  Filled 2020-12-09: qty 3

## 2020-12-09 MED ORDER — ALBUTEROL SULFATE HFA 108 (90 BASE) MCG/ACT IN AERS
2.0000 | INHALATION_SPRAY | RESPIRATORY_TRACT | Status: DC | PRN
  Administered 2020-12-09: 2 via RESPIRATORY_TRACT
  Filled 2020-12-09: qty 6.7

## 2020-12-09 MED ORDER — ALBUTEROL SULFATE HFA 108 (90 BASE) MCG/ACT IN AERS
2.0000 | INHALATION_SPRAY | Freq: Four times a day (QID) | RESPIRATORY_TRACT | 3 refills | Status: DC | PRN
  Filled 2020-12-09: qty 8.5, 25d supply, fill #0

## 2020-12-09 NOTE — ED Notes (Signed)
Patient verbalizes understanding of discharge instructions. Opportunity for questioning and answers were provided. Armband removed by staff, pt discharged from ED ambulatory.   

## 2020-12-09 NOTE — ED Notes (Signed)
Pt visitor wanted this RN to know that pt stopped smoking around 2 weeks ago. Pt smoked for around 4 years.

## 2020-12-09 NOTE — ED Notes (Signed)
Pt ambulatory to restroom and back.

## 2020-12-11 ENCOUNTER — Emergency Department (HOSPITAL_BASED_OUTPATIENT_CLINIC_OR_DEPARTMENT_OTHER)
Admission: EM | Admit: 2020-12-11 | Discharge: 2020-12-11 | Disposition: A | Discharge status: Home / Self Care | Payer: No Typology Code available for payment source | Attending: Emergency Medicine | Admitting: Emergency Medicine

## 2020-12-11 ENCOUNTER — Encounter (HOSPITAL_BASED_OUTPATIENT_CLINIC_OR_DEPARTMENT_OTHER): Payer: Self-pay | Admitting: Emergency Medicine

## 2020-12-11 ENCOUNTER — Other Ambulatory Visit: Payer: Self-pay

## 2020-12-11 DIAGNOSIS — Z87891 Personal history of nicotine dependence: Secondary | ICD-10-CM | POA: Insufficient documentation

## 2020-12-11 DIAGNOSIS — Z79899 Other long term (current) drug therapy: Secondary | ICD-10-CM | POA: Insufficient documentation

## 2020-12-11 DIAGNOSIS — N189 Chronic kidney disease, unspecified: Secondary | ICD-10-CM | POA: Insufficient documentation

## 2020-12-11 DIAGNOSIS — F419 Anxiety disorder, unspecified: Secondary | ICD-10-CM

## 2020-12-11 DIAGNOSIS — Z20822 Contact with and (suspected) exposure to covid-19: Secondary | ICD-10-CM | POA: Insufficient documentation

## 2020-12-11 DIAGNOSIS — I509 Heart failure, unspecified: Secondary | ICD-10-CM | POA: Insufficient documentation

## 2020-12-11 DIAGNOSIS — J40 Bronchitis, not specified as acute or chronic: Secondary | ICD-10-CM | POA: Insufficient documentation

## 2020-12-11 LAB — RESP PANEL BY RT-PCR (FLU A&B, COVID) ARPGX2
Influenza A by PCR: NEGATIVE
Influenza B by PCR: NEGATIVE
SARS Coronavirus 2 by RT PCR: NEGATIVE

## 2020-12-11 MED ORDER — AMOXICILLIN 500 MG PO CAPS
1000.0000 mg | ORAL_CAPSULE | Freq: Two times a day (BID) | ORAL | 0 refills | Status: DC
Start: 2020-12-11 — End: 2021-12-31

## 2020-12-11 MED ORDER — IBUPROFEN 800 MG PO TABS
800.0000 mg | ORAL_TABLET | Freq: Once | ORAL | Status: AC
  Administered 2020-12-11: 800 mg via ORAL
  Filled 2020-12-11: qty 1

## 2020-12-11 MED ORDER — IPRATROPIUM-ALBUTEROL 0.5-2.5 (3) MG/3ML IN SOLN
3.0000 mL | Freq: Once | RESPIRATORY_TRACT | Status: AC
  Administered 2020-12-11: 3 mL via RESPIRATORY_TRACT
  Filled 2020-12-11: qty 3

## 2020-12-11 NOTE — ED Provider Notes (Signed)
[place] EMERGENCY DEPARTMENT Provider Note   CSN: [id number] Arrival date & time: 12/11/20  0302     History Chief Complaint  Patient presents with  . Shortness of Breath    Eileen [name] is a 50 y.o. female.  Patient presents to the emergency department for evaluation of shortness of breath.  Patient reports a tightness in her chest.  She quit smoking around 2 weeks ago and has had symptoms for the last 10 days or so.  She was seen at [facility] several nights ago.  She reports that she felt better after treatment there but symptoms are starting to return.        Past Medical History:  Diagnosis Date  . Allergy    Pollen   . Arthritis   . Asthma   . CHF (congestive heart failure) ([place])    "unsure thinks lungs had fluid around after surgery 7 years ago"  . Chronic kidney disease    kidney infection  . Neuromuscular disorder ([place])    HERNIA  . Right inguinal hernia   . Shortness of breath     Patient Active Problem List   Diagnosis Date Noted  . Bilateral low back pain without sciatica 06/06/2016  . 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 Asthma 09/24/2014  . History of adenomatous polyp of colon 11/07/2013  . Chronic RLQ pain 11/07/2013  . 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 Current smoker 09/26/2013    Past Surgical History:  Procedure Laterality Date  . CESAREAN SECTION     x2  . CESAREAN SECTION    . COLONOSCOPY    . HERNIA REPAIR  09/12/11   RIH  . HERNIA REPAIR    . INGUINAL HERNIA REPAIR  09/12/2011   Procedure: HERNIA REPAIR INGUINAL ADULT;  Surgeon: Gayland Curry, MD;  Location: [place];  Service: General;  Laterality: Right;  open right inguinal hernia with mesh   . POLYPECTOMY    . TUBAL LIGATION       OB History    Gravida  5   Para  4   Term  4   Preterm  0   AB  1   Living  4     SAB  1   IAB  0   Ectopic  0   Multiple      Live Births  4           Family History  Problem Relation Age  of Onset  . Ulcers Father        stomach  . Diabetes Brother   . Esophageal cancer Nephew   . Diabetes Brother   . Colon cancer Neg Hx   . Rectal cancer Neg Hx   . Stomach cancer Neg Hx   . Breast cancer Neg Hx   . Colon polyps Neg Hx     Social History   Tobacco Use  . Smoking status: Former Smoker    Packs/day: 0.50    Years: 20.00    Pack years: 10.00    Types: Cigarettes  . Smokeless tobacco: Never Used  . Tobacco comment: recently quit  Vaping Use  . Vaping Use: Never used  Substance Use Topics  . Alcohol use: No  . Drug use: No    Home Medications Prior to Admission medications   Medication Sig Start Date End Date Taking? Authorizing Provider  acetaminophen (TYLENOL) 500 MG tablet Take 500 mg by mouth every 6 (six) hours as needed for  mild pain, headache or fever.    [provider]  albuterol (VENTOLIN HFA) 108 (90 Base) MCG/ACT inhaler INHALE 2 PUFFS INTO THE LUNGS EVERY 6 (SIX) HOURS AS NEEDED FOR WHEEZING OR SHORTNESS OF BREATH. 12/09/20 12/09/21  Orpah Greek, MD  cetirizine (ZYRTEC) 10 MG tablet Take 10 mg by mouth daily as needed for allergies.    [provider]  fluconazole (DIFLUCAN) 150 MG tablet TAKE 1 TABLET (150 MG TOTAL) BY MOUTH ONCE FOR 1 DOSE. THEN REPEAT IN 72 HOURS Patient not taking: No sig reported 07/06/20 07/06/21  Charlott Rakes, MD  fluconazole (DIFLUCAN) 150 MG tablet TAKE 1 TABLET (150 MG TOTAL) BY MOUTH ONCE FOR 1 DOSE. THEN REPEAT IN 72 HOURS Patient not taking: No sig reported 06/23/20 06/23/21  Charlott Rakes, MD  ibuprofen (ADVIL) 600 MG tablet Take 1 tablet (600 mg total) by mouth every 8 (eight) hours as needed. Patient not taking: Reported on 12/09/2020 03/31/20   Charlott Rakes, MD  Na Sulfate-K Sulfate-Mg Sulf 17.5-3.13-1.6 GM/177ML SOLN TAKE 1 KIT BY MOUTH ONCE FOR 1 DOSE. SUPREP AS DIRECTED. NO SUBSTITUTIONS Patient not taking: Reported on 12/09/2020 10/11/20 10/11/21  Jerene Bears, MD  predniSONE  (DELTASONE) 20 MG tablet Take 2 tablets (40 mg total) by mouth daily with breakfast. 12/09/20   Orpah Greek, MD  sertraline (ZOLOFT) 50 MG tablet TAKE 1 TABLET (50 MG TOTAL) BY MOUTH DAILY. Patient not taking: No sig reported 07/06/20 07/06/21  Charlott Rakes, MD    Allergies    Patient has no known allergies.  Review of Systems   Review of Systems  Respiratory: Positive for chest tightness and shortness of breath.   All other systems reviewed and are negative.   Physical Exam Updated Vital Signs BP 124/73   Pulse 69   Temp 98.4 F (36.9 C) (Oral)   Resp 18   Ht 5' (1.524 m)   Wt 76.1 kg   LMP 11/23/2020   SpO2 100%   BMI 32.75 kg/m   Physical Exam Vitals and nursing note reviewed.  Constitutional:      General: She is not in acute distress.    Appearance: Normal appearance. She is well-developed.  HENT:     Head: Normocephalic and atraumatic.     Right Ear: Hearing normal.     Left Ear: Hearing normal.     Nose: Nose normal.  Eyes:     Conjunctiva/sclera: Conjunctivae normal.     Pupils: Pupils are equal, round, and reactive to light.  Cardiovascular:     Rate and Rhythm: Regular rhythm.     Heart sounds: S1 normal and S2 normal. No murmur heard. No friction rub. No gallop.   Pulmonary:     Effort: Pulmonary effort is normal. No respiratory distress.     Breath sounds: Normal breath sounds.  Chest:     Chest wall: No tenderness.  Abdominal:     General: Bowel sounds are normal.     Palpations: Abdomen is soft.     Tenderness: There is no abdominal tenderness. There is no guarding or rebound. Negative signs include Murphy's sign and McBurney's sign.     Hernia: No hernia is present.  Musculoskeletal:        General: Normal range of motion.     Cervical back: Normal range of motion and neck supple.  Skin:    General: Skin is warm and dry.     Findings: No rash.  Neurological:     Mental  Status: She is alert and oriented to person, place, and time.      GCS: GCS eye subscore is 4. GCS verbal subscore is 5. GCS motor subscore is 6.     Cranial Nerves: No cranial nerve deficit.     Sensory: No sensory deficit.     Coordination: Coordination normal.  Psychiatric:        Speech: Speech normal.        Behavior: Behavior normal.        Thought Content: Thought content normal.     ED Results / Procedures / Treatments   Labs (all labs ordered are listed, but only abnormal results are displayed) Labs Reviewed  RESP PANEL BY RT-PCR (FLU A&B, COVID) [id number]    EKG EKG Interpretation  Date/Time:  Saturday December 11 2020 03:14:05 EDT Ventricular Rate:  68 PR Interval:  141 QRS Duration: 86 QT Interval:  387 QTC Calculation: 412 R Axis:   61 Text Interpretation: Sinus rhythm Low voltage, precordial leads Confirmed by Orpah Greek [phone]) on 12/11/2020 3:51:13 AM   Radiology No results found.  Procedures Procedures   Medications Ordered in ED Medications  ipratropium-albuterol (DUONEB) 0.5-2.5 (3) MG/3ML nebulizer solution 3 mL (has no administration in time range)    ED Course  I have reviewed the triage vital signs and the nursing notes.  Pertinent labs & imaging results that were available during my care of the patient were reviewed by me and considered in my medical decision making (see chart for details).    MDM Rules/Calculators/A&P                          Patient presents to the emergency department with complaints of tightness in her chest.  Patient quit smoking 2 weeks ago and has had symptoms ever since.  I saw her at the emergency department at [facility] 3 days ago.  At that time her work-up was reassuring.  This included cardiac evaluation.  She had a negative D-dimer.  She is low risk by Wells criteria.  Patient is on prednisone and albuterol for suspected inflammation secondary to quitting smoking.  Some of her symptoms currently may be related to anxiety.  Final Clinical Impression(s) / ED  Diagnoses Final diagnoses:  Bronchitis    Rx / DC Orders ED Discharge Orders    None       Issiah Huffaker, Gwenyth Allegra, MD 12/11/20 [phone]

## 2020-12-11 NOTE — ED Notes (Signed)
ED Provider at bedside. Spanish translation for triage performed by [employer].

## 2020-12-11 NOTE — ED Triage Notes (Signed)
Pt c/o shob with chest tightness with inspiration. Pt seen x 3 days ago for same. Pt reports recently quit smoking.

## 2020-12-13 ENCOUNTER — Ambulatory Visit: Payer: Self-pay | Admitting: *Deleted

## 2020-12-13 NOTE — Telephone Encounter (Signed)
Patient is calling to report recent hospitalizations for bronchitis and asthma- since she has been released from hospital- she has had trouble sleeping. She has trouble falling asleep and staying asleep. Call to office to see if patient cal be scheduled within protocol- no appointment availble sooner- advised mobile unit. Patient offered this option- she states she will await her appointment- advised to call back if she feels worse.  Reason for Disposition . Insomnia interferes with work or school  Answer Assessment - Initial Assessment Questions 1. DESCRIPTION: "Tell me about your sleeping problem."      Not sleeping- has trouble falling to sleep, waking during the night  2. ONSET: "How long have you been having trouble sleeping?" (e.g., days, weeks, months)     Trouble sleeping since hospitalization - patient was diagnosed with anxiety  3. RECURRENT: "Have you had sleeping problems before?"  If Yes, ask: "What happened that time?" "What helped your sleeping problem go away in the past?"      no 4. STRESS: "Is there anything in your life that is making you feel stressed or tense?"     No- only thing is patient stopped smoking recently 5. PAIN: "Do you have any pain that is keeping you awake?" (e.g., back pain, headache, abdominal pain)     No- patient is breathing better-so her discomfort is better 6. CAFFEINE ABUSE: "Do you drink caffeinated beverages, and how much each day?" (e.g., coffee, tea, colas)     Coffee in the morning and last afternoon 7. ALCOHOL USE OR SUBSTANCE USE (DRUG USE): "Do you drink alcohol or use any illegal drugs?"     no 8. OTHER SYMPTOMS: "Do you have any other symptoms?"  (e.g., difficulty breathing)     Loss of sleep  Protocols used: INSOMNIA-A-AH

## 2020-12-13 NOTE — Telephone Encounter (Signed)
Noted  

## 2020-12-29 ENCOUNTER — Ambulatory Visit: Payer: Self-pay | Attending: Family Medicine | Admitting: Family Medicine

## 2020-12-29 ENCOUNTER — Other Ambulatory Visit: Payer: Self-pay

## 2020-12-29 ENCOUNTER — Encounter: Payer: Self-pay | Admitting: Family Medicine

## 2020-12-29 VITALS — BP 105/62 | HR 63 | Ht 60.0 in | Wt 170.6 lb

## 2020-12-29 DIAGNOSIS — F419 Anxiety disorder, unspecified: Secondary | ICD-10-CM

## 2020-12-29 DIAGNOSIS — R0602 Shortness of breath: Secondary | ICD-10-CM

## 2020-12-29 DIAGNOSIS — G4709 Other insomnia: Secondary | ICD-10-CM

## 2020-12-29 DIAGNOSIS — F32A Depression, unspecified: Secondary | ICD-10-CM

## 2020-12-29 DIAGNOSIS — J4520 Mild intermittent asthma, uncomplicated: Secondary | ICD-10-CM

## 2020-12-29 DIAGNOSIS — Z9189 Other specified personal risk factors, not elsewhere classified: Secondary | ICD-10-CM

## 2020-12-29 MED ORDER — HYDROXYZINE HCL 25 MG PO TABS
25.0000 mg | ORAL_TABLET | Freq: Two times a day (BID) | ORAL | 1 refills | Status: DC | PRN
  Filled 2020-12-29: qty 60, 30d supply, fill #0

## 2020-12-29 MED ORDER — FLUOXETINE HCL 20 MG PO CAPS
20.0000 mg | ORAL_CAPSULE | Freq: Every day | ORAL | 6 refills | Status: DC
  Filled 2020-12-29: qty 30, 30d supply, fill #0

## 2020-12-29 MED ORDER — ALBUTEROL SULFATE HFA 108 (90 BASE) MCG/ACT IN AERS
2.0000 | INHALATION_SPRAY | Freq: Four times a day (QID) | RESPIRATORY_TRACT | 3 refills | Status: DC | PRN
  Filled 2020-12-29: qty 8.5, 25d supply, fill #0

## 2020-12-29 MED ORDER — MOMETASONE FURO-FORMOTEROL FUM 100-5 MCG/ACT IN AERO
2.0000 | INHALATION_SPRAY | Freq: Two times a day (BID) | RESPIRATORY_TRACT | 3 refills | Status: DC
  Filled 2020-12-29: qty 13, 30d supply, fill #0

## 2020-12-29 NOTE — Patient Instructions (Signed)
Disnea en los adultos Shortness of Breath, Adult La disnea significa que tiene dificultad para respirar. La disnea puede ser un signo de un problema mdico. Siga estas indicaciones en su casa:  Controle si hay algn cambio en sus sntomas.  No consuma ningn producto que contenga nicotina o tabaco, como cigarrillos, cigarrillos electrnicos y tabaco de Higher education careers adviser.  No fume. Fumar puede causar disnea. Si necesita ayuda para dejar de fumar, consulte con el mdico.  Evite todo aquello que puede dificultar la respiracin, por ejemplo: ? Moho. ? Polvo. ? Contaminacin del aire. ? Olores de productos qumicos. ? Cosas que causan sntomas de alergia (alrgenos), si tiene Set designer.  Mantenga la limpieza del espacio en que vive. Use productos que ayuden a Tour manager moho y el polvo.  Descanse todo lo que sea necesario. Retome gradualmente sus actividades habituales.  Tome los medicamentos de venta libre y los recetados solamente como se lo haya indicado el mdico. Esto incluye la oxigenoterapia y los medicamentos inhalados.  Concurra a todas las visitas de seguimiento como se lo haya indicado el mdico. Esto es importante.   Comunquese con un mdico si:  Su afeccin no se alivia tan pronto como se espera.  Le cuesta hacer las actividades cotidianas, incluso despus de Production assistant, radio.  Aparecen nuevos sntomas. Solicite ayuda inmediatamente si:  La disnea empeora.  Tiene dificultad para respirar cuando descansa.  Se siente mareada o se desvanece (se desmaya).  Tiene tos que no se alivia con medicamentos.  Tose y escupe sangre.  Siente dolor al respirar.  Tiene dolor en el pecho, los brazos, los hombros o el vientre (abdomen).  Tiene fiebre.  No puede subir escaleras.  No puede realizar ejercicio del modo en que lo haca habitualmente. Estos sntomas pueden representar un problema grave que constituye Engineer, maintenance (IT). No espere a ver si los sntomas desaparecen. Solicite atencin  mdica de inmediato. Comunquese con el servicio de emergencias de su localidad (911 en los [place]). No conduzca por sus propios medios [employer]. Resumen  La disnea se produce cuando tiene dificultad para inhalar la suficiente cantidad de aire. Puede ser signo de un problema mdico.  Evite las cosas que le dificultan la respiracin, como el fumar, la contaminacin, el moho y [place].  Controle si hay algn cambio en sus sntomas. Comunquese con el mdico si no mejora o si empeora. Esta informacin no tiene Marine scientist el consejo del mdico. Asegrese de hacerle al mdico cualquier pregunta que tenga. Document Revised: 02/26/2018 Document Reviewed: 02/26/2018 Elsevier Patient Education  2021 [employer].

## 2020-12-29 NOTE — Progress Notes (Signed)
Subjective:  Patient ID: Eileen [name], female    DOB: 05-05-1971  Age: 50 y.o. MRN: [id number]  CC: Hospitalization Follow-up   HPI Eileen [name] is a 50 year old female with a history of asthma, depression, previous tobacco abuse (quit in 11/2020) who presents today for an ED follow-up where she was managed for bronchitis. She was seen at the ED on 12/08/2020 and 12/11/2020.  Chest x-ray was negative for acute cardiopulmonary process.  She was treated with nebulizers and prednisone She still feels short of breath rated as 5/10 now compared to 10/10 previously. Last night she felt she was drowning while asleep and had to use a couple of pillows. Symptoms are less during the day. She has no pedal edema or reduced exercise tolerance.  She has no wheezing or chest pain. BNP was 25.2 at the ED. 1 month ago she quit smoking and is wondering if her symptoms are related to quitting smoking.  She has had insomnia and is wondering if her antidepressant medication needs to be changed to improve her insomnia. Also states Zoloft made her worse so she would like a different antidepressant. She endorses presence of Anxiety but no panic attacks, no suicidal ideation or intents. Complains of also being unable to get in touch with her dentist for her routine dental care. Past Medical History:  Diagnosis Date  . Allergy    Pollen   . Arthritis   . Asthma   . CHF (congestive heart failure) ([place])    "unsure thinks lungs had fluid around after surgery 7 years ago"  . Chronic kidney disease    kidney infection  . Neuromuscular disorder ([place])    HERNIA  . Right inguinal hernia   . Shortness of breath     Past Surgical History:  Procedure Laterality Date  . CESAREAN SECTION     x2  . CESAREAN SECTION    . COLONOSCOPY    . HERNIA REPAIR  09/12/11   RIH  . HERNIA REPAIR    . INGUINAL HERNIA REPAIR  09/12/2011   Procedure: HERNIA REPAIR INGUINAL ADULT;  Surgeon: Gayland Curry, MD;   Location: [facility];  Service: General;  Laterality: Right;  open right inguinal hernia with mesh   . POLYPECTOMY    . TUBAL LIGATION      Family History  Problem Relation Age of Onset  . Ulcers Father        stomach  . Diabetes Brother   . Esophageal cancer Nephew   . Diabetes Brother   . Colon cancer Neg Hx   . Rectal cancer Neg Hx   . Stomach cancer Neg Hx   . Breast cancer Neg Hx   . Colon polyps Neg Hx     No Known Allergies  Outpatient Medications Prior to Visit  Medication Sig Dispense Refill  . cetirizine (ZYRTEC) 10 MG tablet Take 10 mg by mouth daily as needed for allergies.    Marland Kitchen albuterol (VENTOLIN HFA) 108 (90 Base) MCG/ACT inhaler INHALE 2 PUFFS INTO THE LUNGS EVERY 6 (SIX) HOURS AS NEEDED FOR WHEEZING OR SHORTNESS OF BREATH. 8.5 g 3  . acetaminophen (TYLENOL) 500 MG tablet Take 500 mg by mouth every 6 (six) hours as needed for mild pain, headache or fever. (Patient not taking: Reported on 12/29/2020)    . amoxicillin (AMOXIL) 500 MG capsule Take 2 capsules (1,000 mg total) by mouth 2 (two) times daily. (Patient not taking: Reported on 12/29/2020) 40 capsule 0  . fluconazole (DIFLUCAN) 150  MG tablet TAKE 1 TABLET (150 MG TOTAL) BY MOUTH ONCE FOR 1 DOSE. THEN REPEAT IN 72 HOURS (Patient not taking: No sig reported) 1 tablet 0  . fluconazole (DIFLUCAN) 150 MG tablet TAKE 1 TABLET (150 MG TOTAL) BY MOUTH ONCE FOR 1 DOSE. THEN REPEAT IN 72 HOURS (Patient not taking: No sig reported) 2 tablet 0  . ibuprofen (ADVIL) 600 MG tablet Take 1 tablet (600 mg total) by mouth every 8 (eight) hours as needed. (Patient not taking: No sig reported) 60 tablet 1  . Na Sulfate-K Sulfate-Mg Sulf 17.5-3.13-1.6 GM/177ML SOLN TAKE 1 KIT BY MOUTH ONCE FOR 1 DOSE. SUPREP AS DIRECTED. NO SUBSTITUTIONS (Patient not taking: No sig reported) 354 mL 0  . predniSONE (DELTASONE) 20 MG tablet Take 2 tablets (40 mg total) by mouth daily with breakfast. (Patient not taking: Reported on 12/29/2020) 10 tablet 0  .  sertraline (ZOLOFT) 50 MG tablet TAKE 1 TABLET (50 MG TOTAL) BY MOUTH DAILY. (Patient not taking: No sig reported) 30 tablet 6   No facility-administered medications prior to visit.     ROS Review of Systems  Constitutional: Negative for activity change, appetite change and fatigue.  HENT: Negative for congestion, sinus pressure and sore throat.   Eyes: Negative for visual disturbance.  Respiratory: Positive for shortness of breath. Negative for cough, chest tightness and wheezing.   Cardiovascular: Negative for chest pain and palpitations.  Gastrointestinal: Negative for abdominal distention, abdominal pain and constipation.  Endocrine: Negative for polydipsia.  Genitourinary: Negative for dysuria and frequency.  Musculoskeletal: Negative for arthralgias and back pain.  Skin: Negative for rash.  Neurological: Negative for tremors, light-headedness and numbness.  Hematological: Does not bruise/bleed easily.  Psychiatric/Behavioral: Negative for agitation and behavioral problems.    Objective:  BP 105/62   Pulse 63   Ht 5' (1.524 m)   Wt 170 lb 9.6 oz (77.4 kg)   SpO2 97%   BMI 33.32 kg/m   BP/Weight 12/29/2020 12/11/2020 02/20/6388  Systolic BP 373 428 768  Diastolic BP 62 72 70  Wt. (Lbs) 170.6 167.7 -  BMI 33.32 32.75 -      Physical Exam Constitutional:      Appearance: She is well-developed.  Neck:     Vascular: No JVD.  Cardiovascular:     Rate and Rhythm: Normal rate.     Heart sounds: Normal heart sounds. No murmur heard.   Pulmonary:     Effort: Pulmonary effort is normal.     Breath sounds: No wheezing or rales.     Comments: Distant breath sounds greater on the right Chest:     Chest wall: No tenderness.  Abdominal:     General: Bowel sounds are normal. There is no distension.     Palpations: Abdomen is soft. There is no mass.     Tenderness: There is no abdominal tenderness.  Musculoskeletal:        General: Normal range of motion.     Right lower  leg: No edema.     Left lower leg: No edema.  Neurological:     Mental Status: She is alert and oriented to person, place, and time.  Psychiatric:        Mood and Affect: Mood normal.     CMP Latest Ref Rng & Units 12/08/2020 08/04/2020 10/22/2019  Glucose 70 - 99 mg/dL 119(H) 80 82  BUN 6 - 20 mg/dL 14 22(H) 15  Creatinine 0.44 - 1.00 mg/dL 0.95 0.89 0.78  Sodium 135 -  145 mmol/L 138 139 142  Potassium 3.5 - 5.1 mmol/L 3.7 3.7 4.2  Chloride 98 - 111 mmol/L 107 107 107(H)  CO2 22 - 32 mmol/L _0 Calcium 8.9 - 10.3 mg/dL 9.2 8.8(L) 9.3  Total Protein 6.5 - 8.1 g/dL - - -  Total Bilirubin 0.3 - 1.2 mg/dL - - -  Alkaline Phos 38 - 126 U/L - - -  AST 15 - 41 U/L - - -  ALT 0 - 44 U/L - - -    Lipid Panel     Component Value Date/Time   CHOL 170 11/18/2015 0942   TRIG 85 11/18/2015 0942   HDL 48 11/18/2015 0942   CHOLHDL 3.5 11/18/2015 0942   CHOLHDL 4.0 Ratio 06/28/2010 2048   VLDL 26 06/28/2010 2048   LDLCALC 105 (H) 11/18/2015 0942    CBC    Component Value Date/Time   WBC 9.1 12/08/2020 2210   RBC 4.08 12/08/2020 2210   HGB 12.8 12/08/2020 2210   HGB 13.7 09/06/2018 1230   HCT 38.0 12/08/2020 2210   HCT 40.2 09/06/2018 1230   PLT 270 12/08/2020 2210   PLT 304 09/06/2018 1230   MCV 93.1 12/08/2020 2210   MCV 92 09/06/2018 1230   MCH 31.4 12/08/2020 2210   MCHC 33.7 12/08/2020 2210   RDW 13.3 12/08/2020 2210   RDW 13.1 09/06/2018 1230   LYMPHSABS 2.7 08/04/2020 1939   MONOABS 1.2 (H) 08/04/2020 1939   EOSABS 0.1 08/04/2020 1939   BASOSABS 0.1 08/04/2020 1939    Lab Results  Component Value Date   HGBA1C 5.5 11/18/2015    Assessment & Plan:  1. Mild intermittent asthma without complication Uncontrolled ICS/LABA initiated Order a PFT at her next visit - mometasone-formoterol (DULERA) 100-5 MCG/ACT AERO; Inhale 2 puffs into the lungs in the morning and at bedtime.  Dispense: 13 g; Refill: 3 - albuterol (VENTOLIN HFA) 108 (90 Base) MCG/ACT inhaler;  INHALE 2 PUFFS INTO THE LUNGS EVERY 6 (SIX) HOURS AS NEEDED FOR WHEEZING OR SHORTNESS OF BREATH.  Dispense: 8.5 g; Refill: 3 - DG Chest 2 View; Future  2. Anxiety and depression Uncontrolled on current regimen Discontinue Zoloft and commence Prozac Hydroxyzine will be beneficial for insomnia - FLUoxetine (PROZAC) 20 MG capsule; Take 1 capsule (20 mg total) by mouth daily.  Dispense: 30 capsule; Refill: 6 - hydrOXYzine (ATARAX/VISTARIL) 25 MG tablet; Take 1 tablet (25 mg total) by mouth 2 (two) times daily as needed.  Dispense: 60 tablet; Refill: 1  3. Other insomnia - hydrOXYzine (ATARAX/VISTARIL) 25 MG tablet; Take 1 tablet (25 mg total) by mouth 2 (two) times daily as needed.  Dispense: 60 tablet; Refill: 1  4. Shortness of breath Multifactorial Treating Asthma but will need to exclude CHF, COPD - ECHOCARDIOGRAM COMPLETE; Future - DG Chest 2 View; Future - Brain natriuretic peptide  5. Need for dental care - Ambulatory referral to Dentistry    Meds ordered this encounter  Medications  . mometasone-formoterol (DULERA) 100-5 MCG/ACT AERO    Sig: Inhale 2 puffs into the lungs in the morning and at bedtime.    Dispense:  13 g    Refill:  3  . albuterol (VENTOLIN HFA) 108 (90 Base) MCG/ACT inhaler    Sig: INHALE 2 PUFFS INTO THE LUNGS EVERY 6 (SIX) HOURS AS NEEDED FOR WHEEZING OR SHORTNESS OF BREATH.    Dispense:  8.5 g    Refill:  3  . FLUoxetine (PROZAC) 20 MG capsule  Sig: Take 1 capsule (20 mg total) by mouth daily.    Dispense:  30 capsule    Refill:  6    Discontinue Zoloft  . hydrOXYzine (ATARAX/VISTARIL) 25 MG tablet    Sig: Take 1 tablet (25 mg total) by mouth 2 (two) times daily as needed.    Dispense:  60 tablet    Refill:  1    Follow-up: Return in about 2 weeks (around 01/12/2021) for Follow-up on shortness of breath.       Charlott Rakes, MD, FAAFP. [facility] and [place], [place]   12/29/2020, 12:32 PM

## 2020-12-30 LAB — BRAIN NATRIURETIC PEPTIDE: BNP: 15 pg/mL (ref 0.0–100.0)

## 2020-12-31 ENCOUNTER — Telehealth: Payer: Self-pay

## 2020-12-31 NOTE — Telephone Encounter (Signed)
Patient name and DOB has been verified Patient was informed of lab results. Patient had no questions.  

## 2020-12-31 NOTE — Telephone Encounter (Signed)
-----   Message from Charlott Rakes, MD sent at 12/30/2020  4:58 PM EDT ----- Please inform the patient that labs are normal. Thank you.

## 2021-01-03 ENCOUNTER — Ambulatory Visit (HOSPITAL_COMMUNITY)
Admission: RE | Admit: 2021-01-03 | Discharge: 2021-01-03 | Disposition: A | Discharge status: Home / Self Care | Payer: Self-pay | Source: Ambulatory Visit | Attending: Family Medicine | Admitting: Family Medicine

## 2021-01-03 ENCOUNTER — Other Ambulatory Visit: Payer: Self-pay

## 2021-01-03 DIAGNOSIS — Z87891 Personal history of nicotine dependence: Secondary | ICD-10-CM | POA: Insufficient documentation

## 2021-01-03 DIAGNOSIS — R0602 Shortness of breath: Secondary | ICD-10-CM

## 2021-01-03 DIAGNOSIS — R0600 Dyspnea, unspecified: Secondary | ICD-10-CM | POA: Insufficient documentation

## 2021-01-03 LAB — ECHOCARDIOGRAM COMPLETE
Area-P 1/2: 3.84 cm2
S' Lateral: 3.3 cm

## 2021-01-03 NOTE — Progress Notes (Signed)
Echocardiogram 2D Echocardiogram has been performed.  Eileen [name] M 01/03/2021, 3:49 PM

## 2021-01-07 ENCOUNTER — Telehealth: Payer: Self-pay | Admitting: Family Medicine

## 2021-01-07 ENCOUNTER — Telehealth: Payer: Self-pay

## 2021-01-07 NOTE — Telephone Encounter (Signed)
Patient called to ask the nurse to call regarding her depression/anxiety medications.  She stated that when she takes them at night, she sometimes has her heart raising very fast.  She said it is only when she takes the medication.  She did not know the names of the meds when she called.  Please advise and call to discuss further at [phone]

## 2021-01-07 NOTE — Telephone Encounter (Signed)
Pt states that when she takes new medication her heart began to race, does not know if its the Prozac or Hydroxine.  Does patient need a virtual visit to discuss.

## 2021-01-07 NOTE — Telephone Encounter (Signed)
Pt is calling back with [employer] on the line. Pt is calling back for her lab results. Pt states she was instructed to call back.

## 2021-01-07 NOTE — Telephone Encounter (Signed)
Using [employer] [phone]. Patient called to give results and she mentioned that she called earlier to let the doctor know about her heart racing when taking Fluoxetine and Hydroxyzine. She says she has noticed this happening 2 nights ago. She says she takes both pills at night and she wakes up with her heart racing and her chest is hurting like the heart is coming out her chest. She says it lasts a while, but she's not able to time how long this lasts. She says even when she hears a loud noise and it startles her, the heart races. She asks what does she need to do and if it's the medicine. I advised it could be a side effect of the medication. She denies feeling those symptoms at this time. I advised to only take the Hydroxyzine tonight and the Fluoxetine in the morning to figure out which medication is causing these symptoms. I advised if she wakes up tonight with those symptoms that she can say it is the hydroxyzine. I advised to go to the ED with these symptoms. Advised if no symptoms tonight, take the Fluoxetine in the morning and if symptoms appear to go to the ED. Advised someone will call next week with the recommendation of Dr. Margarita Rana. Patient verbalized understanding.

## 2021-01-07 NOTE — Telephone Encounter (Signed)
Patient has medication concerns

## 2021-01-07 NOTE — Telephone Encounter (Signed)
Patient was called and a voicemail was left informing patient to return phone call for lab results. 

## 2021-01-07 NOTE — Telephone Encounter (Signed)
Using [employer] [phone]. Patient called and given results as noted by Dr. Margarita Rana on 01/04/21 below, patient verbalized understanding.

## 2021-01-07 NOTE — Telephone Encounter (Signed)
-----   Message from Charlott Rakes, MD sent at 01/04/2021  1:03 PM EDT ----- Please inform her that her echocardiogram reveals normal heart function which does not explain her shortness of breath.  We will follow-up on this at her next visit.

## 2021-01-10 NOTE — Telephone Encounter (Signed)
I agree with RN recommendation as we need elimination to identify the medication which is the culprit so we can discontinue it.

## 2021-01-10 NOTE — Telephone Encounter (Signed)
Noted  

## 2021-01-31 ENCOUNTER — Telehealth: Payer: Self-pay | Admitting: Family Medicine

## 2021-01-31 NOTE — Telephone Encounter (Signed)
Patient has appt with Dr Margarita Rana 6/7 and it will be virtual. Interpreter left patient voicemail that appt is virtual but if in person is preferred to call [phone] to reschedule appt.

## 2021-02-01 ENCOUNTER — Other Ambulatory Visit: Payer: Self-pay | Admitting: Family Medicine

## 2021-02-01 ENCOUNTER — Ambulatory Visit: Payer: Self-pay | Attending: Family Medicine | Admitting: Family Medicine

## 2021-02-01 ENCOUNTER — Encounter: Payer: Self-pay | Admitting: Family Medicine

## 2021-02-01 ENCOUNTER — Other Ambulatory Visit: Payer: Self-pay

## 2021-02-01 DIAGNOSIS — G4709 Other insomnia: Secondary | ICD-10-CM

## 2021-02-01 DIAGNOSIS — F419 Anxiety disorder, unspecified: Secondary | ICD-10-CM

## 2021-02-01 DIAGNOSIS — J4520 Mild intermittent asthma, uncomplicated: Secondary | ICD-10-CM

## 2021-02-01 MED ORDER — MONTELUKAST SODIUM 10 MG PO TABS
10.0000 mg | ORAL_TABLET | Freq: Every day | ORAL | 3 refills | Status: DC
  Filled 2021-02-01: qty 30, 30d supply, fill #0
  Filled 2021-03-03: qty 30, 30d supply, fill #1
  Filled 2021-04-05: qty 30, 30d supply, fill #2
  Filled 2021-05-11: qty 30, 30d supply, fill #3

## 2021-02-01 MED ORDER — FLUOXETINE HCL 20 MG PO CAPS
20.0000 mg | ORAL_CAPSULE | Freq: Every day | ORAL | 6 refills | Status: DC
  Filled 2021-02-01: qty 30, 30d supply, fill #0
  Filled 2021-03-03: qty 30, 30d supply, fill #1
  Filled 2021-04-05: qty 30, 30d supply, fill #2
  Filled 2021-05-11: qty 30, 30d supply, fill #3
  Filled 2021-06-16: qty 30, 30d supply, fill #4
  Filled 2021-07-29: qty 30, 30d supply, fill #5

## 2021-02-01 MED ORDER — HYDROXYZINE HCL 25 MG PO TABS
25.0000 mg | ORAL_TABLET | Freq: Two times a day (BID) | ORAL | 1 refills | Status: DC | PRN
  Filled 2021-02-01: qty 60, 30d supply, fill #0
  Filled 2021-03-03: qty 60, 30d supply, fill #1

## 2021-02-01 NOTE — Telephone Encounter (Signed)
Medication Refill - Medication: FLUoxetine (PROZAC) 20 MG capsule hydrOXYzine (ATARAX/VISTARIL) 25 MG tablet      Preferred Pharmacy (with phone number or street name): [facility]   Agent: Please be advised that RX refills may take up to 3 business days. We ask that you follow-up with your pharmacy.

## 2021-02-01 NOTE — Progress Notes (Signed)
Virtual Visit via Telephone Note  I connected with Eileen [name], on 02/01/2021 at 2:25 PM by telephone due to the COVID-19 pandemic and verified that I am speaking with the correct person using two identifiers.   Consent: I discussed the limitations, risks, security and privacy concerns of performing an evaluation and management service by telephone and the availability of in person appointments. I also discussed with the patient that there may be a patient responsible charge related to this service. The patient expressed understanding and agreed to proceed.   Location of Patient: Home  Location of Provider: [place]   Persons participating in Telemedicine visit: Marynell Alvarez-Crespo Rollene Rotunda - Med Student Dr. Margarita Rana     History of Present Illness: Eileen [name] is a 50 year old female with a history of asthma, depression, previous tobacco abuse (quit in 11/2020) who presents today for a  Follow up visit. At her last visit she was commenced on [facility] for persisting dyspnea She still has episodes of dyspnea every third or fourth day she states but not as bad as she did previously. Echo from 12/2020 revealed: IMPRESSIONS    1. Left ventricular ejection fraction, by estimation, is 60 to 65%. The  left ventricle has normal function. The left ventricle has no regional  wall motion abnormalities. Left ventricular diastolic parameters are  consistent with Grade I diastolic  dysfunction (impaired relaxation).  2. Right ventricular systolic function is normal. The right ventricular  size is normal. There is normal pulmonary artery systolic pressure. The  estimated right ventricular systolic pressure is 57.9 mmHg.  3. The mitral valve is normal in structure. Trivial mitral valve  regurgitation. No evidence of mitral stenosis.  4. The aortic valve is tricuspid. Aortic valve regurgitation is not  visualized. No aortic stenosis is present.  5. The inferior  vena cava is normal in size with greater than 50%  respiratory variability, suggesting right atrial pressure of 3 mmHg.    Past Medical History:  Diagnosis Date  . Allergy    Pollen   . Arthritis   . Asthma   . CHF (congestive heart failure) ([place])    "unsure thinks lungs had fluid around after surgery 7 years ago"  . Chronic kidney disease    kidney infection  . Neuromuscular disorder ([place])    HERNIA  . Right inguinal hernia   . Shortness of breath    No Known Allergies  Current Outpatient Medications on File Prior to Visit  Medication Sig Dispense Refill  . acetaminophen (TYLENOL) 500 MG tablet Take 500 mg by mouth every 6 (six) hours as needed for mild pain, headache or fever. (Patient not taking: Reported on 12/29/2020)    . albuterol (VENTOLIN HFA) 108 (90 Base) MCG/ACT inhaler INHALE 2 PUFFS INTO THE LUNGS EVERY 6 (SIX) HOURS AS NEEDED FOR WHEEZING OR SHORTNESS OF BREATH. 8.5 g 3  . amoxicillin (AMOXIL) 500 MG capsule Take 2 capsules (1,000 mg total) by mouth 2 (two) times daily. (Patient not taking: Reported on 12/29/2020) 40 capsule 0  . cetirizine (ZYRTEC) 10 MG tablet Take 10 mg by mouth daily as needed for allergies.    . fluconazole (DIFLUCAN) 150 MG tablet TAKE 1 TABLET (150 MG TOTAL) BY MOUTH ONCE FOR 1 DOSE. THEN REPEAT IN 72 HOURS (Patient not taking: No sig reported) 1 tablet 0  . fluconazole (DIFLUCAN) 150 MG tablet TAKE 1 TABLET (150 MG TOTAL) BY MOUTH ONCE FOR 1 DOSE. THEN REPEAT IN 72 HOURS (Patient not taking: No sig  reported) 2 tablet 0  . ibuprofen (ADVIL) 600 MG tablet Take 1 tablet (600 mg total) by mouth every 8 (eight) hours as needed. (Patient not taking: No sig reported) 60 tablet 1  . mometasone-formoterol (DULERA) 100-5 MCG/ACT AERO Inhale 2 puffs into the lungs in the morning and at bedtime. 13 g 3  . Na Sulfate-K Sulfate-Mg Sulf 17.5-3.13-1.6 GM/177ML SOLN TAKE 1 KIT BY MOUTH ONCE FOR 1 DOSE. SUPREP AS DIRECTED. NO SUBSTITUTIONS (Patient not taking: No sig  reported) 354 mL 0  . predniSONE (DELTASONE) 20 MG tablet Take 2 tablets (40 mg total) by mouth daily with breakfast. (Patient not taking: Reported on 12/29/2020) 10 tablet 0   No current facility-administered medications on file prior to visit.    ROS: See HPI  Observations/Objective: Alert, awake, oriented x3 Not in acute distress Normal mood  Assessment and Plan: 1. Mild intermittent asthma without complication Uncontrolled with ongoing dyspnea Singulair added to regimen - Pulmonary function test; Future - montelukast (SINGULAIR) 10 MG tablet; Take 1 tablet (10 mg total) by mouth at bedtime.  Dispense: 30 tablet; Refill: 3   Follow Up Instructions: 3 months   I discussed the assessment and treatment plan with the patient. The patient was provided an opportunity to ask questions and all were answered. The patient agreed with the plan and demonstrated an understanding of the instructions.   The patient was advised to call back or seek an in-person evaluation if the symptoms worsen or if the condition fails to improve as anticipated.     I provided 12 minutes total of non-face-to-face time during this encounter.   Charlott Rakes, MD, FAAFP. [facility] and [place], [place]   02/01/2021, 2:25 PM

## 2021-02-01 NOTE — Telephone Encounter (Signed)
Last seen today.

## 2021-03-03 ENCOUNTER — Other Ambulatory Visit: Payer: Self-pay

## 2021-04-05 ENCOUNTER — Other Ambulatory Visit: Payer: Self-pay

## 2021-04-06 ENCOUNTER — Other Ambulatory Visit: Payer: Self-pay

## 2021-05-11 ENCOUNTER — Other Ambulatory Visit: Payer: Self-pay

## 2021-05-11 ENCOUNTER — Encounter: Payer: Self-pay | Admitting: Family Medicine

## 2021-05-11 ENCOUNTER — Ambulatory Visit: Payer: Self-pay | Attending: Family Medicine | Admitting: Family Medicine

## 2021-05-11 VITALS — BP 102/70 | HR 70 | Resp 16 | Wt 183.4 lb

## 2021-05-11 DIAGNOSIS — M722 Plantar fascial fibromatosis: Secondary | ICD-10-CM

## 2021-05-11 DIAGNOSIS — F32A Depression, unspecified: Secondary | ICD-10-CM

## 2021-05-11 DIAGNOSIS — M7540 Impingement syndrome of unspecified shoulder: Secondary | ICD-10-CM

## 2021-05-11 DIAGNOSIS — R0602 Shortness of breath: Secondary | ICD-10-CM

## 2021-05-11 DIAGNOSIS — G4709 Other insomnia: Secondary | ICD-10-CM

## 2021-05-11 DIAGNOSIS — F419 Anxiety disorder, unspecified: Secondary | ICD-10-CM

## 2021-05-11 MED ORDER — MELOXICAM 7.5 MG PO TABS
7.5000 mg | ORAL_TABLET | Freq: Every day | ORAL | 1 refills | Status: DC
  Filled 2021-05-11: qty 30, 30d supply, fill #0
  Filled 2021-06-16: qty 30, 30d supply, fill #1

## 2021-05-11 MED ORDER — GABAPENTIN 300 MG PO CAPS
300.0000 mg | ORAL_CAPSULE | Freq: Every day | ORAL | 3 refills | Status: DC
  Filled 2021-05-11: qty 30, 30d supply, fill #0
  Filled 2021-06-16: qty 30, 30d supply, fill #1
  Filled 2021-07-29: qty 30, 30d supply, fill #2

## 2021-05-11 MED ORDER — HYDROXYZINE HCL 25 MG PO TABS
25.0000 mg | ORAL_TABLET | Freq: Two times a day (BID) | ORAL | 1 refills | Status: DC | PRN
Start: 2021-05-11 — End: 2022-02-09
  Filled 2021-05-11: qty 60, 30d supply, fill #0

## 2021-05-11 NOTE — Progress Notes (Signed)
Patient says she also feels tingly sensation and says she has not been to sleep in a couple of weeks.

## 2021-05-11 NOTE — Patient Instructions (Signed)
Fascitis plantar, rehabilitacin Plantar Fasciitis Rehab Pregunte al mdico qu ejercicios son seguros para usted. Haga los ejercicios exactamente como se lo haya indicado el mdico y gradelos como se lo hayan indicado. Es normal sentir un estiramiento leve, tironeo, opresin o Tree surgeon al [employer] [place]. Detngase de inmediato si siente un dolor repentino o Printmaker. No comience a hacer estos ejercicios hasta que se lo indique el mdico. Ejercicios de elongacin y amplitud de movimiento Estos ejercicios calientan los msculos y las articulaciones, y mejoran la movilidad y la flexibilidad del pie. Adems, ayudan a Best boy. Estiramiento de la fascia plantar  Sintese con la pierna izquierda/derecha cruzada sobre la rodilla [place]. Sostenga el taln con una mano, con el pulgar cerca del arco. Con la otra [place], sostenga los dedos de los pies y empjelos con [place]. Debe sentir un estiramiento en la base (la parte de abajo) de los dedos o en la parte de abajo del pie (fascia plantar), o en ambos. Mantenga esta posicin durante _________ segundos. Afloje lentamente los dedos y vuelva a la posicin inicial. Repita __________ veces. Realice este ejercicio __________ veces al da. Estiramiento de los gemelos, de pie Este ejercicio tambin se denomina estiramiento de la pantorrilla (los msculos gemelos). Estira los msculos posteriores de la parte superior de la pantorrilla. Prese con las [employer] pared. Extienda la pierna izquierda/derecha hacia atrs y flexione ligeramente la rodilla de la pierna de adelante. Mantenga los talones apoyados en el suelo, los dedos apuntando hacia delante y la rodilla de atrs extendida, y lleve el peso hacia la pared. No arquee la espalda. Debe sentir un ligero estiramiento en la parte superior de la pantorrilla. Mantenga esta posicin durante __________ segundos. Repita __________ veces. Realice este ejercicio  __________ veces al da. Estiramiento del msculo sleo, de pie Este ejercicio tambin se denomina estiramiento de la pantorrilla (sleo). Estira los msculos posteriores de la parte inferior de la pantorrilla. Prese con las [employer] pared. Extienda la pierna izquierda/derecha hacia atrs y flexione ligeramente la rodilla de la pierna de adelante. Mantenga los talones apoyados en el suelo y los dedos apuntando hacia delante, flexione la rodilla de atrs y lleve el peso ligeramente a la pierna de atrs. Debe sentir un estiramiento suave en la parte profunda de la parte inferior de la pantorrilla. Mantenga esta posicin durante __________ segundos. Repita __________ veces. Realice este ejercicio __________ veces al da. Estiramiento de los [employer] gemelos y sleo, de pie con un escaln Este ejercicio estira los msculos posteriores de la parte inferior de la pierna. Estos msculos se encuentran en la parte superior de la pantorrilla (gastrocnemio) y la parte inferior de la pantorrilla (sleo). Prese delante de un escaln apoyando solo la regin metatarsiana de su pie derecho/izquierdo. La regin metatarsiana del pie es la superficie sobre la que caminamos, justo debajo de los dedos. Mantenga el otro pie apoyado con firmeza en el mismo escaln. Sostngase de la pared o de una baranda para mantener el equilibrio. [place]ante lentamente el [employer], y permita que el peso del cuerpo presione el taln sobre el borde del frente del escaln. Mantenga la rodilla recta y sin doblar. Debe sentir un estiramiento en la pantorrilla. Mantenga esta posicin durante __________ segundos. Vuelva a poner ambos pies sobre el escaln. Repita este ejercicio con una leve flexin en la rodilla izquierda/derecha. Reptalo __________ veces con la rodilla izquierda/derecha extendida y __________ veces con la rodilla izquierda/derecha flexionada. Realice este ejercicio  __________ veces al da. Ejercicio de  equilibrio Este ejercicio aumenta el equilibrio y el control de la fuerza del arco, para ayudar a reducir la presin sobre la fascia plantar. Pararse sobre una pierna Si este ejercicio es muy fcil, puede intentar hacerlo con los ojos cerrados o parado sobre [place]. Sin calzado, prese cerca de una baranda o [place]. Puede sostenerse de la baranda o del marco de la puerta, segn lo necesite. Prese sobre el pie izquierdo/derecho. Sin despegar el dedo gordo del suelo, levante el arco del pie. Debe sentir un estiramiento en la parte de abajo del pie y el arco. No deje que el pie se vaya hacia adentro. Mantenga esta posicin durante __________ segundos. Repita __________ veces. Realice este ejercicio __________ veces al da. Esta informacin no tiene Marine scientist el consejo del mdico. Asegrese de hacerle al mdico cualquier pregunta que tenga. Document Revised: 06/25/2020 Document Reviewed: 06/25/2020 Elsevier Patient Education  2022 [employer].

## 2021-05-11 NOTE — Progress Notes (Signed)
Subjective:  Patient ID: Eileen [name], female    DOB: 1970/12/28  Age: 50 y.o. MRN: [id number]  CC: Arm Pain and Leg Pain   HPI Eileen [name] is a 50 y.o. year old female with a history of asthma, depression, previous tobacco abuse (quit in 11/2020) who presents today for a  Follow up visit.  Interval History: She complains of bilateral arm pain and tingling from her her shoulders to hands R>L for the last couple of years but for the last few days pain has been constant. Pain prevents her from sleeping.  She is R handed.. From her knees down both legs hurt.  She complains of pain in her feet on waking up in the morning and this sometimes extends to her bilateral calves and then symptoms improve as the day progresses.  Her prevous respiratory symptoms have improved on her inhalers but she sometimes thinks when her anxiety worsens it affects her breathing. Past Medical History:  Diagnosis Date   Allergy    Pollen    Arthritis    Asthma    CHF (congestive heart failure) ([place])    "unsure thinks lungs had fluid around after surgery 7 years ago"   Chronic kidney disease    kidney infection   Neuromuscular disorder ([place])    HERNIA   Right inguinal hernia    Shortness of breath     Past Surgical History:  Procedure Laterality Date   CESAREAN SECTION     x2   CESAREAN SECTION     COLONOSCOPY     HERNIA REPAIR  09/12/11   RIH   HERNIA REPAIR     INGUINAL HERNIA REPAIR  09/12/2011   Procedure: HERNIA REPAIR INGUINAL ADULT;  Surgeon: Gayland Curry, MD;  Location: [facility];  Service: General;  Laterality: Right;  open right inguinal hernia with mesh    POLYPECTOMY     TUBAL LIGATION      Family History  Problem Relation Age of Onset   Ulcers Father        stomach   Diabetes Brother    Esophageal cancer Nephew    Diabetes Brother    Colon cancer Neg Hx    Rectal cancer Neg Hx    Stomach cancer Neg Hx    Breast cancer Neg Hx    Colon polyps Neg Hx     No Known  Allergies  Outpatient Medications Prior to Visit  Medication Sig Dispense Refill   acetaminophen (TYLENOL) 500 MG tablet Take 500 mg by mouth every 6 (six) hours as needed for mild pain, headache or fever.     albuterol (VENTOLIN HFA) 108 (90 Base) MCG/ACT inhaler INHALE 2 PUFFS INTO THE LUNGS EVERY 6 (SIX) HOURS AS NEEDED FOR WHEEZING OR SHORTNESS OF BREATH. 8.5 g 3   amoxicillin (AMOXIL) 500 MG capsule Take 2 capsules (1,000 mg total) by mouth 2 (two) times daily. 40 capsule 0   cetirizine (ZYRTEC) 10 MG tablet Take 10 mg by mouth daily as needed for allergies.     fluconazole (DIFLUCAN) 150 MG tablet TAKE 1 TABLET (150 MG TOTAL) BY MOUTH ONCE FOR 1 DOSE. THEN REPEAT IN 72 HOURS 1 tablet 0   fluconazole (DIFLUCAN) 150 MG tablet TAKE 1 TABLET (150 MG TOTAL) BY MOUTH ONCE FOR 1 DOSE. THEN REPEAT IN 72 HOURS 2 tablet 0   FLUoxetine (PROZAC) 20 MG capsule Take 1 capsule (20 mg total) by mouth daily. 30 capsule 6   ibuprofen (ADVIL) 600 MG tablet  Take 1 tablet (600 mg total) by mouth every 8 (eight) hours as needed. 60 tablet 1   mometasone-formoterol (DULERA) 100-5 MCG/ACT AERO Inhale 2 puffs into the lungs in the morning and at bedtime. 13 g 3   montelukast (SINGULAIR) 10 MG tablet Take 1 tablet (10 mg total) by mouth at bedtime. 30 tablet 3   Na Sulfate-K Sulfate-Mg Sulf 17.5-3.13-1.6 GM/177ML SOLN TAKE 1 KIT BY MOUTH ONCE FOR 1 DOSE. SUPREP AS DIRECTED. NO SUBSTITUTIONS 354 mL 0   predniSONE (DELTASONE) 20 MG tablet Take 2 tablets (40 mg total) by mouth daily with breakfast. 10 tablet 0   hydrOXYzine (ATARAX/VISTARIL) 25 MG tablet Take 1 tablet (25 mg total) by mouth 2 (two) times daily as needed. 60 tablet 1   No facility-administered medications prior to visit.     ROS Review of Systems  Constitutional:  Negative for activity change, appetite change and fatigue.  HENT:  Negative for congestion, sinus pressure and sore throat.   Eyes:  Negative for visual disturbance.  Respiratory:   Negative for cough, chest tightness, shortness of breath and wheezing.   Cardiovascular:  Negative for chest pain and palpitations.  Gastrointestinal:  Negative for abdominal distention, abdominal pain and constipation.  Endocrine: Negative for polydipsia.  Genitourinary:  Negative for dysuria and frequency.  Musculoskeletal:        See HPI  Skin:  Negative for rash.  Neurological:  Negative for tremors, light-headedness and numbness.  Hematological:  Does not bruise/bleed easily.  Psychiatric/Behavioral:  Negative for agitation and behavioral problems.    Objective:  BP 102/70   Pulse 70   Resp 16   Wt 183 lb 6.4 oz (83.2 kg)   SpO2 96%   BMI 35.82 kg/m   BP/Weight 05/11/2021 12/29/2020 05/07/3111  Systolic BP 162 446 950  Diastolic BP 70 62 72  Wt. (Lbs) 183.4 170.6 167.7  BMI 35.82 33.32 32.75      Physical Exam Constitutional:      Appearance: She is well-developed.  Cardiovascular:     Rate and Rhythm: Normal rate.     Heart sounds: Normal heart sounds. No murmur heard. Pulmonary:     Effort: Pulmonary effort is normal.     Breath sounds: Normal breath sounds. No wheezing or rales.  Chest:     Chest wall: No tenderness.  Abdominal:     General: Bowel sounds are normal. There is no distension.     Palpations: Abdomen is soft. There is no mass.     Tenderness: There is no abdominal tenderness.  Musculoskeletal:        General: Normal range of motion.     Right lower leg: No edema.     Left lower leg: No edema.     Comments: Positive Hawkins signs bilaterally Normal handgrip bilaterally  Neurological:     Mental Status: She is alert and oriented to person, place, and time.  Psychiatric:        Mood and Affect: Mood normal.    CMP Latest Ref Rng & Units 12/08/2020 08/04/2020 10/22/2019  Glucose 70 - 99 mg/dL 119(H) 80 82  BUN 6 - 20 mg/dL 14 22(H) 15  Creatinine 0.44 - 1.00 mg/dL 0.95 0.89 0.78  Sodium 135 - 145 mmol/L 138 139 142  Potassium 3.5 - 5.1 mmol/L  3.7 3.7 4.2  Chloride 98 - 111 mmol/L 107 107 107(H)  CO2 22 - 32 mmol/L _0 Calcium 8.9 - 10.3 mg/dL 9.2 8.8(L) 9.3  Total Protein 6.5 - 8.1 g/dL - - -  Total Bilirubin 0.3 - 1.2 mg/dL - - -  Alkaline Phos 38 - 126 U/L - - -  AST 15 - 41 U/L - - -  ALT 0 - 44 U/L - - -    Lipid Panel     Component Value Date/Time   CHOL 170 11/18/2015 0942   TRIG 85 11/18/2015 0942   HDL 48 11/18/2015 0942   CHOLHDL 3.5 11/18/2015 0942   CHOLHDL 4.0 Ratio 06/28/2010 2048   VLDL 26 06/28/2010 2048   LDLCALC 105 (H) 11/18/2015 0942    CBC    Component Value Date/Time   WBC 9.1 12/08/2020 2210   RBC 4.08 12/08/2020 2210   HGB 12.8 12/08/2020 2210   HGB 13.7 09/06/2018 1230   HCT 38.0 12/08/2020 2210   HCT 40.2 09/06/2018 1230   PLT 270 12/08/2020 2210   PLT 304 09/06/2018 1230   MCV 93.1 12/08/2020 2210   MCV 92 09/06/2018 1230   MCH 31.4 12/08/2020 2210   MCHC 33.7 12/08/2020 2210   RDW 13.3 12/08/2020 2210   RDW 13.1 09/06/2018 1230   LYMPHSABS 2.7 08/04/2020 1939   MONOABS 1.2 (H) 08/04/2020 1939   EOSABS 0.1 08/04/2020 1939   BASOSABS 0.1 08/04/2020 1939    Lab Results  Component Value Date   HGBA1C 5.5 11/18/2015    Assessment & Plan:  1. Plantar fasciitis Advised to obtain insoles Counseled on plantar fasciitis exercises - meloxicam (MOBIC) 7.5 MG tablet; Take 1 tablet (7.5 mg total) by mouth daily.  Dispense: 30 tablet; Refill: 1  2. Impingement syndrome of shoulder region, unspecified laterality - gabapentin (NEURONTIN) 300 MG capsule; Take 1 capsule (300 mg total) by mouth at bedtime.  Dispense: 30 capsule; Refill: 3  3. Anxiety and depression Stable with intermittent exacerbations with associated respiratory symptoms Consider psychotherapy down the road if symptoms persist - hydrOXYzine (ATARAX/VISTARIL) 25 MG tablet; Take 1 tablet (25 mg total) by mouth 2 (two) times daily as needed.  Dispense: 60 tablet; Refill: 1  4. Other insomnia - hydrOXYzine  (ATARAX/VISTARIL) 25 MG tablet; Take 1 tablet (25 mg total) by mouth 2 (two) times daily as needed.  Dispense: 60 tablet; Refill: 1  5. Shortness of breath She does have a presumptive diagnosis of asthma in her chart I had referred her for PFTs which she never had; we will call the PFT lab to follow-up on this Meanwhile continue ICS/LABA, SABA, Singulair .  She has additional concerns including menopausal symptoms which have advised her will need to be addressed at her next visit. Meds ordered this encounter  Medications   meloxicam (MOBIC) 7.5 MG tablet    Sig: Take 1 tablet (7.5 mg total) by mouth daily.    Dispense:  30 tablet    Refill:  1   hydrOXYzine (ATARAX/VISTARIL) 25 MG tablet    Sig: Take 1 tablet (25 mg total) by mouth 2 (two) times daily as needed.    Dispense:  60 tablet    Refill:  1   gabapentin (NEURONTIN) 300 MG capsule    Sig: Take 1 capsule (300 mg total) by mouth at bedtime.    Dispense:  30 capsule    Refill:  3    Follow-up: Return in about 1 month (around 06/10/2021) for complete physical exam and menopausal symptoms.       Charlott Rakes, MD, FAAFP. [facility] and [facility] [place], [place]   05/11/2021,  1:21 PM

## 2021-05-13 ENCOUNTER — Other Ambulatory Visit: Payer: Self-pay

## 2021-05-13 ENCOUNTER — Other Ambulatory Visit: Payer: Self-pay | Admitting: Family Medicine

## 2021-05-13 ENCOUNTER — Ambulatory Visit: Payer: Self-pay | Attending: Family Medicine

## 2021-05-14 LAB — SARS CORONAVIRUS 2 (TAT 6-24 HRS): SARS Coronavirus 2: NEGATIVE

## 2021-05-16 ENCOUNTER — Other Ambulatory Visit: Payer: Self-pay

## 2021-05-16 ENCOUNTER — Ambulatory Visit (HOSPITAL_COMMUNITY)
Admission: RE | Admit: 2021-05-16 | Discharge: 2021-05-16 | Disposition: A | Discharge status: Home / Self Care | Payer: No Typology Code available for payment source | Source: Ambulatory Visit | Attending: Family Medicine | Admitting: Family Medicine

## 2021-05-16 DIAGNOSIS — J4520 Mild intermittent asthma, uncomplicated: Secondary | ICD-10-CM | POA: Insufficient documentation

## 2021-05-16 LAB — PULMONARY FUNCTION TEST
DL/VA % pred: 106 %
DL/VA: 4.72 ml/min/mmHg/L
DLCO unc % pred: 100 %
DLCO unc: 19.21 ml/min/mmHg
FEF 25-75 Post: 1.8 L/sec
FEF 25-75 Pre: 2.29 L/sec
FEF2575-%Change-Post: -21 %
FEF2575-%Pred-Post: 67 %
FEF2575-%Pred-Pre: 85 %
FEV1-%Change-Post: -4 %
FEV1-%Pred-Post: 77 %
FEV1-%Pred-Pre: 80 %
FEV1-Post: 1.98 L
FEV1-Pre: 2.07 L
FEV1FVC-%Change-Post: -3 %
FEV1FVC-%Pred-Pre: 102 %
FEV6-%Change-Post: 0 %
FEV6-%Pred-Post: 78 %
FEV6-%Pred-Pre: 79 %
FEV6-Post: 2.47 L
FEV6-Pre: 2.49 L
FEV6FVC-%Change-Post: 0 %
FEV6FVC-%Pred-Post: 102 %
FEV6FVC-%Pred-Pre: 102 %
FVC-%Change-Post: -1 %
FVC-%Pred-Post: 76 %
FVC-%Pred-Pre: 77 %
FVC-Post: 2.48 L
FVC-Pre: 2.5 L
Post FEV1/FVC ratio: 80 %
Post FEV6/FVC ratio: 100 %
Pre FEV1/FVC ratio: 83 %
Pre FEV6/FVC Ratio: 100 %
RV % pred: 62 %
RV: 1 L
TLC % pred: 82 %
TLC: 3.79 L

## 2021-05-16 MED ORDER — ALBUTEROL SULFATE (2.5 MG/3ML) 0.083% IN NEBU
2.5000 mg | INHALATION_SOLUTION | Freq: Once | RESPIRATORY_TRACT | Status: AC
  Administered 2021-05-16: 2.5 mg via RESPIRATORY_TRACT

## 2021-06-06 ENCOUNTER — Ambulatory Visit: Payer: No Typology Code available for payment source | Admitting: Family Medicine

## 2021-06-16 ENCOUNTER — Other Ambulatory Visit: Payer: Self-pay

## 2021-07-29 ENCOUNTER — Other Ambulatory Visit: Payer: Self-pay

## 2021-08-22 ENCOUNTER — Other Ambulatory Visit: Payer: Self-pay

## 2021-08-22 ENCOUNTER — Encounter (HOSPITAL_BASED_OUTPATIENT_CLINIC_OR_DEPARTMENT_OTHER): Payer: Self-pay | Admitting: Urology

## 2021-08-22 ENCOUNTER — Emergency Department (HOSPITAL_BASED_OUTPATIENT_CLINIC_OR_DEPARTMENT_OTHER)
Admission: EM | Admit: 2021-08-22 | Discharge: 2021-08-22 | Discharge status: Against Medical Advice | Payer: No Typology Code available for payment source | Attending: Emergency Medicine | Admitting: Emergency Medicine

## 2021-08-22 DIAGNOSIS — M79671 Pain in right foot: Secondary | ICD-10-CM | POA: Insufficient documentation

## 2021-08-22 DIAGNOSIS — Z5321 Procedure and treatment not carried out due to patient leaving prior to being seen by health care provider: Secondary | ICD-10-CM | POA: Insufficient documentation

## 2021-08-22 DIAGNOSIS — M79672 Pain in left foot: Secondary | ICD-10-CM | POA: Insufficient documentation

## 2021-08-22 NOTE — ED Notes (Signed)
Needs Spanish interpreter

## 2021-08-22 NOTE — ED Triage Notes (Signed)
Bilateral foot pain, worse on left  Pain for 2 months, worsening over past 4 days

## 2021-08-31 ENCOUNTER — Ambulatory Visit: Payer: Self-pay | Admitting: *Deleted

## 2021-08-31 NOTE — Telephone Encounter (Signed)
Reviewed symptoms with patient via interpreter [place], [place] Chief Complaint: right hand and feet swelling and painful. Requesting appt  Symptoms: right hand painful  and tingling at times and bilateral feet pain and swelling , hurts to touch at times  Frequency: pain x 2 months , swelling in feet x 2 weeks  Pertinent Negatives: Patient denies chest pain , difficulty breathing, fever, swelling up to knees, redness  Disposition: [] ED /[] Urgent Care (no appt availability in office) / [x] Appointment(In office/virtual)/ []  [facility] [place] [facility]/ [] Home Care/ [] Refused Recommended Disposition /[] [place] [facility]/ []  Follow-up with PCP Additional Notes:  Earliest appt scheduled for 09/22/21       Reason for Disposition  [1] MODERATE pain (e.g., interferes with normal activities) AND [2] present > 3 days  Answer Assessment - Initial Assessment Questions 1. ONSET: "When did the pain start?"     Approx 2 months ago . Left foot then right foot then right hand  2. LOCATION: "Where is the pain located?"     Bilateral feet and right hand  3. PAIN: "How bad is the pain?" (Scale 1-10; or mild, moderate, severe)   - MILD (1-3): doesn't interfere with normal activities   - MODERATE (4-7): interferes with normal activities (e.g., work or school) or awakens from sleep   - SEVERE (8-10): excruciating pain, unable to use hand at all     Moderate waking up from sleep from foot pain  4. WORK OR EXERCISE: "Has there been any recent work or exercise that involved this part (i.e., hand or wrist) of the body?"     No  5. CAUSE: "What do you think is causing the pain?"     Not sure  6. AGGRAVATING FACTORS: "What makes the pain worse?" (e.g., using computer)    None  7. OTHER SYMPTOMS: "Do you have any other symptoms?" (e.g., neck pain, swelling, rash, numbness, fever)     Swelling  and pain in  bilateral feet tingling in hand  8. PREGNANCY: "Is there any chance you are pregnant?" "When was your  last menstrual period?"     na  Protocols used: Hand and Wrist Pain-A-AH

## 2021-09-22 ENCOUNTER — Other Ambulatory Visit: Payer: Self-pay

## 2021-09-22 ENCOUNTER — Ambulatory Visit: Payer: Self-pay | Attending: Physician Assistant | Admitting: Physician Assistant

## 2021-09-22 DIAGNOSIS — G5603 Carpal tunnel syndrome, bilateral upper limbs: Secondary | ICD-10-CM

## 2021-09-22 DIAGNOSIS — F32A Depression, unspecified: Secondary | ICD-10-CM

## 2021-09-22 DIAGNOSIS — M722 Plantar fascial fibromatosis: Secondary | ICD-10-CM

## 2021-09-22 DIAGNOSIS — F419 Anxiety disorder, unspecified: Secondary | ICD-10-CM

## 2021-09-22 DIAGNOSIS — Z789 Other specified health status: Secondary | ICD-10-CM

## 2021-09-22 MED ORDER — MELOXICAM 7.5 MG PO TABS
7.5000 mg | ORAL_TABLET | Freq: Every day | ORAL | 1 refills | Status: DC
  Filled 2021-09-22: qty 30, 30d supply, fill #0

## 2021-09-22 MED ORDER — BUSPIRONE HCL 10 MG PO TABS
10.0000 mg | ORAL_TABLET | Freq: Two times a day (BID) | ORAL | 3 refills | Status: DC
  Filled 2021-09-22: qty 60, 30d supply, fill #0

## 2021-09-22 NOTE — Progress Notes (Signed)
Patient ID: Eileen [name], female   DOB: 01-03-1971, 51 y.o.   MRN: [id number] Virtual Visit via Telephone Note  I connected with Salley Alvarez-Crespo on 09/22/21 at 10:30 AM EST by telephone and verified that I am speaking with the correct person using two identifiers.  Location: Patient: home Provider: home office Mikle Bosworth with [employer] interpre[employer] ID [phone]   I discussed the limitations, risks, security and privacy concerns of performing an evaluation and management service by telephone and the availability of in person appointments. I also discussed with the patient that there may be a patient responsible charge related to this service. The patient expressed understanding and agreed to proceed.   History of Present Illness: Pain in B hands esp at night.  R>L.    B feet pain-L>R.  (She has been seen for this before.    She is complaining of weight gain and thinks it is due to prozac so she stopped taking it ~70months ago.  She also stopped smoking about 6 months ago.  We discussed this at length.  She wants something to help with anxiety from stopping smoking.  Denies SI/HI   Observations/Objective:   Assessment and Plan: 1. Bilateral carpal tunnel syndrome - Ambulatory referral to Hand Surgery - meloxicam (MOBIC) 7.5 MG tablet; Take 1 tablet (7.5 mg total) by mouth daily. Prn pain  Dispense: 30 tablet; Refill: 1 -obtain wrist splints and provided spelling in spanish of conditions  2. Plantar fasciitis - Ambulatory referral to Podiatry - meloxicam (MOBIC) 7.5 MG tablet; Take 1 tablet (7.5 mg total) by mouth daily. Prn pain  Dispense: 30 tablet; Refill: 1  3. Language barrier [employer] used and additional time performing visit was required.   4. Anxiety and depression start - busPIRone (BUSPAR) 10 MG tablet; Take 1 tablet (10 mg total) by mouth 2 (two) times daily.  Dispense: 60 tablet; Refill: 3 Weight gain likely to eating more after stopping  smoking   Follow Up Instructions: Recheck anxiety/depression/weight gain with PCP in about 6 weeks   I discussed the assessment and treatment plan with the patient. The patient was provided an opportunity to ask questions and all were answered. The patient agreed with the plan and demonstrated an understanding of the instructions.   The patient was advised to call back or seek an in-person evaluation if the symptoms worsen or if the condition fails to improve as anticipated.  I provided 26 minutes of non-face-to-face time during this encounter.   Freeman Caldron, PA-C

## 2021-09-29 ENCOUNTER — Other Ambulatory Visit: Payer: Self-pay

## 2021-09-29 ENCOUNTER — Ambulatory Visit (INDEPENDENT_AMBULATORY_CARE_PROVIDER_SITE_OTHER): Payer: Self-pay | Admitting: Orthopaedic Surgery

## 2021-09-29 DIAGNOSIS — M25532 Pain in left wrist: Secondary | ICD-10-CM

## 2021-09-29 DIAGNOSIS — M722 Plantar fascial fibromatosis: Secondary | ICD-10-CM

## 2021-09-29 DIAGNOSIS — M25531 Pain in right wrist: Secondary | ICD-10-CM

## 2021-09-29 MED ORDER — PREDNISONE 10 MG PO TABS
ORAL_TABLET | ORAL | 0 refills | Status: DC
  Filled 2021-09-29: qty 21, 6d supply, fill #0

## 2021-09-29 NOTE — Progress Notes (Signed)
Office Visit Note   Patient: Idali Lafever           Date of Birth: [birth year]           MRN: [id number] Visit Date: 09/29/2021              Requested by: Argentina Donovan, PA-C [place],  [place] [address] PCP: Charlott Rakes, MD   Assessment & Plan: Visit Diagnoses:  1. Plantar fasciitis, bilateral   2. Bilateral wrist pain     Plan: Impression is bilateral and carpal tunnel syndrome, right de Quervain's tenosynovitis and bilateral foot plantar fasciitis.  I believe majority of her symptoms to the upper extremities are coming from carpal tunnel syndrome and I would like to go ahead and order nerve conduction study/EMG to assess this.  Night splints have not worked in the past so will not provide new splints at today's visit.  I have agreed to call in a steroid pack due to the pain to the entire right upper extremity.  she will follow-up with [place] once her nerve conduction study has been completed.  In regards to the feet, I provided her with a plantar fascia stretching program.  I have also recommended supportive shoes with orthotics. Follow-Up Instructions: No follow-ups on file.   Orders:  No orders of the defined types were placed in this encounter.  No orders of the defined types were placed in this encounter.     Procedures: No procedures performed   Clinical Data: No additional findings.   Subjective: Chief Complaint  Patient presents with   Left Hand - Pain   Right Hand - Pain    HPI patient is a pleasant 51 year old right-hand-dominant Spanish-speaking female who is here today with an interpreter.  She is here with bilateral hand pain and paresthesias right greater than left in addition to bilateral foot pain left greater than right.  In regards to her hands, she has had pain and paresthesias for over 10 years.  Symptoms appear to be worse at night and at work.  She does frequently wake up having to shake her hands.  She has recently started  meloxicam with minimal relief.  She has tried night splinting in the past without relief.  She is now also complaining of pain up the right forearm and into the elbow.  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  In regards to her feet, both feet hurt at the heel.  Pain is worse first thing in the morning with first few steps she takes.  She also has pain throughout the day.  She denies any injury or change in activity.  She does note that she has recently gained a lot of weight.  Review of Systems as detailed in HPI.  All others reviewed and are negative.   Objective: Vital Signs: There were no vitals taken for this visit.  Physical Exam vital [place] woman female no acute distress.  Alert and oriented x3.  Ortho Exam bilateral hand exam reveals constant tingling throughout the median nerve distribution on the right.  Positive Tinel at the wrist.  Negative Tinel at the elbow.  Negative Phalen.  No thenar atrophy.  She does have tenderness along the right lateral epicondyle and radial tunnel.  She has moderate pain to the first dorsal compartment with a positive Wynn Maudlin.  She has increased pain with supination at the wrist.  She denies any pain with resisted long finger extension.  Left wrist shows a negative  Tinel and negative Phalen.  No thenar atrophy.  She is neurovascularly intact distally.  Bilateral foot exam shows moderate tenderness to the heel over the plantar fascia insertion.  She can dorsiflex to 15 degrees.  She is neurovascular intact distally.  Specialty Comments:  No specialty comments available.  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   Current smoker 09/26/2013   Past Medical History:   Diagnosis Date   Allergy    Pollen    Arthritis    Asthma    CHF (congestive heart failure) ([place])    "unsure thinks lungs had fluid around after surgery 7 years ago"   Chronic kidney disease    kidney infection   Neuromuscular disorder ([place])    HERNIA   Right inguinal hernia    Shortness of breath     Family History  Problem Relation Age of Onset   Ulcers Father        stomach   Diabetes Brother    Esophageal cancer Nephew    Diabetes Brother    Colon cancer Neg Hx    Rectal cancer Neg Hx    Stomach cancer Neg Hx    Breast cancer Neg Hx    Colon polyps Neg Hx     Past Surgical History:  Procedure Laterality Date   CESAREAN SECTION     x2   CESAREAN SECTION     COLONOSCOPY     HERNIA REPAIR  09/12/11   RIH   HERNIA REPAIR     INGUINAL HERNIA REPAIR  09/12/2011   Procedure: HERNIA REPAIR INGUINAL ADULT;  Surgeon: Gayland Curry, MD;  Location: [place];  Service: General;  Laterality: Right;  open right inguinal hernia with mesh    POLYPECTOMY     TUBAL LIGATION     Social History   Occupational History   Occupation: Cleaning  Tobacco Use   Smoking status: Former    Packs/day: 0.50    Years: 20.00    Pack years: 10.00    Types: Cigarettes   Smokeless tobacco: Never   Tobacco comments:    recently quit  Vaping Use   Vaping Use: Never used  Substance and Sexual Activity   Alcohol use: No   Drug use: No   Sexual activity: Yes    Birth control/protection: Surgical

## 2021-10-06 ENCOUNTER — Other Ambulatory Visit: Payer: Self-pay

## 2021-10-19 ENCOUNTER — Other Ambulatory Visit: Payer: Self-pay

## 2021-11-08 ENCOUNTER — Ambulatory Visit: Payer: Self-pay | Admitting: Family Medicine

## 2021-12-30 ENCOUNTER — Encounter (HOSPITAL_BASED_OUTPATIENT_CLINIC_OR_DEPARTMENT_OTHER): Payer: Self-pay | Admitting: Emergency Medicine

## 2021-12-30 ENCOUNTER — Emergency Department (HOSPITAL_BASED_OUTPATIENT_CLINIC_OR_DEPARTMENT_OTHER)
Admission: EM | Admit: 2021-12-30 | Discharge: 2021-12-31 | Disposition: A | Discharge status: Home / Self Care | Payer: Self-pay | Attending: Emergency Medicine | Admitting: Emergency Medicine

## 2021-12-30 ENCOUNTER — Other Ambulatory Visit: Payer: Self-pay

## 2021-12-30 DIAGNOSIS — M79672 Pain in left foot: Secondary | ICD-10-CM | POA: Insufficient documentation

## 2021-12-30 DIAGNOSIS — G8929 Other chronic pain: Secondary | ICD-10-CM | POA: Insufficient documentation

## 2021-12-30 DIAGNOSIS — Z20822 Contact with and (suspected) exposure to covid-19: Secondary | ICD-10-CM | POA: Insufficient documentation

## 2021-12-30 DIAGNOSIS — M79671 Pain in right foot: Secondary | ICD-10-CM | POA: Insufficient documentation

## 2021-12-30 DIAGNOSIS — M25531 Pain in right wrist: Secondary | ICD-10-CM

## 2021-12-30 LAB — RESP PANEL BY RT-PCR (FLU A&B, COVID) ARPGX2
Influenza A by PCR: NEGATIVE
Influenza B by PCR: NEGATIVE
SARS Coronavirus 2 by RT PCR: NEGATIVE

## 2021-12-30 NOTE — ED Triage Notes (Signed)
Generalized body aches, congestion, and headache x 3 weeks. Also c/o BL foot pain.  ?

## 2021-12-31 MED ORDER — IBUPROFEN 600 MG PO TABS
600.0000 mg | ORAL_TABLET | Freq: Three times a day (TID) | ORAL | 1 refills | Status: DC | PRN
  Filled 2021-12-31: qty 60, 20d supply, fill #0

## 2021-12-31 NOTE — ED Provider Notes (Signed)
? ?[facility] EMERGENCY DEPARTMENT  ?Provider Note ? ?CSN: [id number] ?Arrival date & time: 12/30/21 2219 ? ?History ?Chief Complaint  ?Patient presents with  ? Foot Pain  ? ? ?Eileen [name] is a 51 y.o. female, history obtained via Video interpreter. She gives vague and incomplete answers to most questions but it seems she is here for evaluation of chronic bilateral foot pain ongoing for several months. She had a visit to PCP and with Ortho earlier this year, diagnosed with plantar fasciitis as well as carpal tunnel syndrome but lost to follow up. She reports pain has continued. She take OTC NSAIDs with some improvement. She did not voice any new complaints to me today.  ? ? ?Home Medications ?Prior to Admission medications   ?Medication Sig Start Date End Date Taking? Authorizing Provider  ?acetaminophen (TYLENOL) 500 MG tablet Take 500 mg by mouth every 6 (six) hours as needed for mild pain, headache or fever.    [provider]  ?albuterol (VENTOLIN HFA) 108 (90 Base) MCG/ACT inhaler INHALE 2 PUFFS INTO THE LUNGS EVERY 6 (SIX) HOURS AS NEEDED FOR WHEEZING OR SHORTNESS OF BREATH. 12/29/20 12/29/21  Charlott Rakes, MD  ?busPIRone (BUSPAR) 10 MG tablet Take 1 tablet (10 mg total) by mouth 2 (two) times daily. 09/22/21   Argentina Donovan, PA-C  ?cetirizine (ZYRTEC) 10 MG tablet Take 10 mg by mouth daily as needed for allergies.    [provider]  ?FLUoxetine (PROZAC) 20 MG capsule Take 1 capsule (20 mg total) by mouth daily. 02/01/21   Charlott Rakes, MD  ?gabapentin (NEURONTIN) 300 MG capsule Take 1 capsule (300 mg total) by mouth at bedtime. 05/11/21   Charlott Rakes, MD  ?hydrOXYzine (ATARAX/VISTARIL) 25 MG tablet Take 1 tablet (25 mg total) by mouth 2 (two) times daily as needed. 05/11/21   Charlott Rakes, MD  ?ibuprofen (ADVIL) 600 MG tablet Take 1 tablet (600 mg total) by mouth every 8 (eight) hours as needed. 12/31/21   Truddie Hidden, MD  ?mometasone-formoterol [facility]) 100-5  MCG/ACT AERO Inhale 2 puffs into the lungs in the morning and at bedtime. 12/29/20   Charlott Rakes, MD  ?montelukast (SINGULAIR) 10 MG tablet Take 1 tablet (10 mg total) by mouth at bedtime. 02/01/21   Charlott Rakes, MD  ?predniSONE (DELTASONE) 10 MG tablet take as directed 09/29/21   Aundra Dubin, PA-C  ? ? ? ?Allergies    ?Patient has no known allergies. ? ? ?Review of Systems   ?Review of Systems ?Please see HPI for pertinent positives and negatives ? ?Physical Exam ?BP 110/64   Pulse 90   Temp 98.5 ?F (36.9 ?C)   Resp 16   SpO2 95%  ? ?Physical Exam ?Vitals and nursing note reviewed.  ?HENT:  ?   Head: Normocephalic.  ?   Nose: Nose normal.  ?Eyes:  ?   Extraocular Movements: Extraocular movements intact.  ?Pulmonary:  ?   Effort: Pulmonary effort is normal.  ?Musculoskeletal:     ?   General: Normal range of motion.  ?   Cervical back: Neck supple.  ?   Comments: Dried skin on bottom of both feet  ?Skin: ?   Findings: No rash (on exposed skin).  ?Neurological:  ?   Mental Status: She is alert and oriented to person, place, and time.  ?Psychiatric:     ?   Mood and Affect: Mood normal.  ? ? ?ED Results / Procedures / Treatments   ?EKG ?None ? ?  Procedures ?Procedures ? ?Medications Ordered in the ED ?Medications - No data to display ? ?Initial Impression and Plan ? Patient with chronic bilateral feet pain. She was seen by Ortho in Feb and recommended for nerve conduction studies for her CTS, also given stretches for plantar fasciitis. She does not have any acute complaints today that I can discern. Recommend she continue with NSAIDs and follow up with PCP and/or Ortho.  ? ?ED Course  ? ?  ? ? ?MDM Rules/Calculators/A&P ?Medical Decision Making ?Problems Addressed: ?Chronic foot pain, unspecified laterality: chronic illness or injury with exacerbation, progression, or side effects of treatment ? ? ? ?Final Clinical Impression(s) / ED Diagnoses ?Final diagnoses:  ?Chronic foot pain, unspecified laterality   ? ? ?Rx / DC Orders ?ED Discharge Orders   ? ?      Ordered  ?  ibuprofen (ADVIL) 600 MG tablet  Every 8 hours PRN       ? 12/31/21 0033  ? ?  ?  ? ?  ? ?  ?Truddie Hidden, MD ?12/31/21 [phone] ? ?

## 2022-01-02 ENCOUNTER — Other Ambulatory Visit: Payer: Self-pay

## 2022-01-09 ENCOUNTER — Other Ambulatory Visit: Payer: Self-pay

## 2022-01-13 ENCOUNTER — Ambulatory Visit: Payer: Self-pay

## 2022-01-13 NOTE — Telephone Encounter (Signed)
Patient has an appointment on 02/09/2022 swelling can be discussed then.

## 2022-01-13 NOTE — Telephone Encounter (Signed)
  Chief Complaint: Foot pain - mild swelling Symptoms: Pain 8/10 foot swelling Frequency: 3 months - pain since last year Pertinent Negatives: Patient denies Fever, SOB, Chest pain Disposition: '[]'$ ED /'[x]'$ Urgent Care (no appt availability in office) / '[]'$ Appointment(In office/virtual)/ '[]'$  [facility]/ '[]'$ Home Care/ '[]'$ Refused Recommended Disposition /'[]'$ [place] [facility]/ '[]'$  Follow-up with PCP Additional Notes: Pt refuses UC and wishes to be seen in office. Pt has been seen before for this problem and has been to ortho.  Summary: feet swelling   Pt called in for assistance. Pt says that she has been experiencing feet swelling for about 3 months. Pt says when she sit down her swelling goes down. Office next opening is on 6/23. Pt would like further assistance      Reason for Disposition  [1] MODERATE pain (e.g., interferes with normal activities, limping) AND [2] present > 3 days  Answer Assessment - Initial Assessment Questions 1. LOCATION: "Which ankle is swollen?" "Where is the swelling?"     Both feet 2. ONSET: "When did the swelling start?"     3 months- Pain has been since last year 3. SIZE: "How large is the swelling?"     Mild 4. PAIN: "Is there any pain?" If Yes, ask: "How bad is it?" (Scale 1-10; or mild, moderate, severe)   - NONE (0): no pain.   - MILD (1-3): doesn't interfere with normal activities.    - MODERATE (4-7): interferes with normal activities (e.g., work or school) or awakens from sleep, limping.    - SEVERE (8-10): excruciating pain, unable to do any normal activities, unable to walk.      8/10 5. CAUSE: "What do you think caused the ankle swelling?"     unknown 6. OTHER SYMPTOMS: "Do you have any other symptoms?" (e.g., fever, chest pain, difficulty breathing, calf pain)     Calf pain - no fever, HA's, chills. No has asthma 7. PREGNANCY: "Is there any chance you are pregnant?" "When was your last menstrual period?"     na  Protocols used: Ankle  Swelling-A-AH

## 2022-02-09 ENCOUNTER — Encounter: Payer: Self-pay | Admitting: Physician Assistant

## 2022-02-09 ENCOUNTER — Ambulatory Visit: Payer: Self-pay | Attending: Physician Assistant | Admitting: Physician Assistant

## 2022-02-09 ENCOUNTER — Other Ambulatory Visit: Payer: Self-pay

## 2022-02-09 VITALS — BP 116/76 | HR 69 | Wt 200.6 lb

## 2022-02-09 DIAGNOSIS — F419 Anxiety disorder, unspecified: Secondary | ICD-10-CM

## 2022-02-09 DIAGNOSIS — J4520 Mild intermittent asthma, uncomplicated: Secondary | ICD-10-CM

## 2022-02-09 DIAGNOSIS — M722 Plantar fascial fibromatosis: Secondary | ICD-10-CM

## 2022-02-09 DIAGNOSIS — G5603 Carpal tunnel syndrome, bilateral upper limbs: Secondary | ICD-10-CM

## 2022-02-09 DIAGNOSIS — Z1331 Encounter for screening for depression: Secondary | ICD-10-CM

## 2022-02-09 DIAGNOSIS — Z789 Other specified health status: Secondary | ICD-10-CM

## 2022-02-09 DIAGNOSIS — G4709 Other insomnia: Secondary | ICD-10-CM

## 2022-02-09 DIAGNOSIS — F32A Depression, unspecified: Secondary | ICD-10-CM

## 2022-02-09 MED ORDER — MONTELUKAST SODIUM 10 MG PO TABS
10.0000 mg | ORAL_TABLET | Freq: Every day | ORAL | 3 refills | Status: DC
  Filled 2022-02-09: qty 30, 30d supply, fill #0

## 2022-02-09 MED ORDER — BUSPIRONE HCL 10 MG PO TABS
10.0000 mg | ORAL_TABLET | Freq: Two times a day (BID) | ORAL | 3 refills | Status: DC
  Filled 2022-02-09: qty 60, 30d supply, fill #0

## 2022-02-09 MED ORDER — HYDROXYZINE HCL 25 MG PO TABS
25.0000 mg | ORAL_TABLET | Freq: Two times a day (BID) | ORAL | 1 refills | Status: DC | PRN
  Filled 2022-02-09: qty 60, 30d supply, fill #0
  Filled 2022-08-24 – 2022-08-25 (×2): qty 60, 30d supply, fill #1

## 2022-02-09 MED ORDER — ALBUTEROL SULFATE HFA 108 (90 BASE) MCG/ACT IN AERS
2.0000 | INHALATION_SPRAY | Freq: Four times a day (QID) | RESPIRATORY_TRACT | 3 refills | Status: DC | PRN
  Filled 2022-02-09: qty 8.5, 25d supply, fill #0

## 2022-02-09 MED ORDER — MELOXICAM 15 MG PO TABS
15.0000 mg | ORAL_TABLET | Freq: Every day | ORAL | 3 refills | Status: DC
  Filled 2022-02-09: qty 30, 30d supply, fill #0
  Filled 2022-03-09: qty 30, 30d supply, fill #1
  Filled 2022-04-13: qty 30, 30d supply, fill #2
  Filled 2022-05-11: qty 30, 30d supply, fill #3

## 2022-02-09 MED ORDER — MOMETASONE FURO-FORMOTEROL FUM 100-5 MCG/ACT IN AERO
2.0000 | INHALATION_SPRAY | Freq: Two times a day (BID) | RESPIRATORY_TRACT | 3 refills | Status: DC
  Filled 2022-02-09: qty 13, 30d supply, fill #0

## 2022-02-09 MED ORDER — FLUOXETINE HCL 20 MG PO CAPS
20.0000 mg | ORAL_CAPSULE | Freq: Every day | ORAL | 6 refills | Status: DC
  Filled 2022-02-09: qty 30, 30d supply, fill #0
  Filled 2022-03-09: qty 30, 30d supply, fill #1
  Filled 2022-04-13: qty 30, 30d supply, fill #2
  Filled 2022-05-11: qty 30, 30d supply, fill #3
  Filled 2022-06-13: qty 30, 30d supply, fill #4
  Filled 2022-07-13: qty 30, 30d supply, fill #5
  Filled 2022-08-24 – 2022-08-25 (×3): qty 30, 30d supply, fill #6

## 2022-02-09 MED ORDER — GABAPENTIN 300 MG PO CAPS
300.0000 mg | ORAL_CAPSULE | Freq: Every day | ORAL | 3 refills | Status: DC
  Filled 2022-02-09: qty 30, 30d supply, fill #0
  Filled 2022-03-09: qty 30, 30d supply, fill #1
  Filled 2022-04-13: qty 30, 30d supply, fill #2
  Filled 2022-05-11: qty 30, 30d supply, fill #3

## 2022-02-09 NOTE — Patient Instructions (Addendum)
[facility] [address], [place], [place] [address] [phone] or [phone] Walk-in urgent care 24/7 for anyone  For [facility] ONLY New patient assessments and therapy walk-ins: Monday and Wednesday 8am-11am First and second Friday 1pm-5pm New patient psychiatry and medication management walk-ins: Mondays, Wednesdays, Thursdays, Fridays 8am-11am No psychiatry walk-ins on Tuesday   Control de la ansiedad en los adultos Managing Anxiety, Adult Despus de haber recibido un diagnstico de ansiedad, es posible que se sienta aliviado al saber por qu se haba sentido o haba actuado de cierto modo. Es posible que tambin se sienta abrumado por el tratamiento que tiene por delante y por lo que este significar para su vida. Con atencin y 54, usted puede controlar esta afeccin. Cmo manejar los cambios en el estilo de vida Control del estrs y la ansiedad  El estrs es la reaccin del cuerpo ante los cambios y los acontecimientos de la vida, tanto buenos [place]. La State Farm de los episodios de estrs duran slo algunas horas, pero el estrs puede ser continuo y Forensic psychologist a ms que solo estrs. Aunque el estrs puede desempear un papel importante en la ansiedad, no es lo mismo que la ansiedad. El estrs generalmente es causado por algo externo, como una fecha lmite, una prueba o una competencia. El estrs normalmente pasa despus de que el evento desencadenante ha terminado.  La ansiedad es causada por algo interno, por ejemplo, imaginar un resultado terrible o preocuparse porque algo ir mal y lo devastar. A menudo, la ansiedad no desaparece incluso despus de que el evento desencadenante ha finalizado y puede tornarse en una preocupacin a largo plazo (crnica). Es importante comprender las diferencias entre el estrs y la ansiedad, y Chief Technology Officer el estrs de manera efectiva para que no genere una respuesta de ansiedad. Hable con el mdico o un  consejero para obtener ms informacin sobre cmo reducir la ansiedad y Dealer. Es posible que el profesional sugiera tcnicas para reducir la tensin, tales como: Musicoterapia. Pase tiempo creando o escuchando msica que disfrute y lo inspire. Meditacin consciente. Practique el estar consciente de la respiracin normal sin intentar controlarla. Puede realizarse mientras est sentado o camina. Oracin centrante. Esto implica centrarse en una palabra, frase o imagen sagrada que le signifique algo y le genere paz. Respiracin profunda. Para hacer esto, expanda el estmago e inhale lentamente por la nariz. [place] unos 3 a 5 segundos. Luego, exhale lentamente mientras deja que los msculos del estmago se relajen. Dilogo interno. Aprenda a notar e identificar patrones de pensamiento que conducen a reacciones de ansiedad y cambie esos patrones a pensamientos que se transmitan tranquilidad. Relajacin muscular. Dedique tiempo a tensar los msculos y General Electric. Elija una tcnica para reducir la tensin que se adapte a su estilo de vida y su personalidad. Estas tcnicas llevan tiempo y prctica. Resrvese de 5 a 15 minutos por da para Ambulance person. Algunos terapeutas pueden ofrecer orientacin y capacitacin en estas tcnicas. Es posible que algunos planes de seguros mdicos cubran la capacitacin. Otras cosas que puede hacer para controlar el estrs y la ansiedad incluyen: Catering manager un registro del estrs. Esto puede ayudarlo a identificar qu le desencadena su reaccin y, Air traffic controller, aprender las maneras de controlar su respuesta al [place]. Pensar en cmo reacciona ante ciertas situaciones. Es posible que no sea capaz de Chief Technology Officer todo, pero puede Environmental education officer. Hacerse tiempo para las actividades que lo ayudan a Nurse, children's y no sentir culpa por pasar  su tiempo de [place]. Crear imgenes visuales. Esto implica imaginar o crear imgenes mentales para ayudarlo a  relajarse. Practicar yoga. A travs del yoga, puede disminuir la tensin y Emergency planning/management officer.  Medicamentos Los medicamentos pueden ayudar a [employer]. [employer]. [employer]. Algunos medicamentos para la ansiedad: Antidepresivos. Por lo general, se recetan para el control diario a Barrister's clerk. Medicamentos para la ansiedad. Estos se pueden agregar en casos graves, especialmente cuando ocurren crisis de [place]. Un mdico recetar los medicamentos. Cuando se usan juntos, los medicamentos, la psicoterapia y las tcnicas de reduccin de la tensin pueden ser el tratamiento ms efectivo. [place] interpersonales pueden ser muy importantes para ayudar a su recuperacin. Intente pasar ms tiempo interactuando con amigos y familiares de [place]. Considere la posibilidad de ir a terapia de pareja, si tiene una pareja, tomar clases de educacin familiar o ir a Careers information officer. La terapia puede ayudarlos a usted y a los dems a comprender mejor su afeccin. Cmo Museum/gallery conservator en su ansiedad Cada persona responde de [place] diferente al tratamiento de la ansiedad. Se dice que est recuperado de la ansiedad cuando los sntomas disminuyen y dejan de Cabin crew en las actividades diarias en el hogar o [place]. Esto podra significar que comenzar a Field seismologist lo siguiente: Associate Professor y atencin. Tener menos interferencia de la preocupacin en el pensamiento diario. Dormir mejor. Estar menos irritable. Tener ms energa. Tener Liberty Media. Tambin es Glass blower/designer cuando su afeccin empeora. Comunquese con el mdico si sus sntomas interfieren en su hogar o su trabajo, y usted siente que su afeccin no est mejorando. Siga estas instrucciones en su casa: Actividad Actividad fsica. Los adultos deben hacer lo siguiente: Optometrist, al menos, 150 minutos de actividad fsica por semana. El ejercicio debe aumentar la frecuencia cardaca y Nature conservation officer transpirar (ejercicio  de intensidad moderada). Realizar ejercicios de fortalecimiento por lo Hall[employer]ur[employer] Company por semana. Dormir bien y por el tiempo adecuado. La State Farm de los adultos necesitan entre 7 y 9 horas de sueo todas las noches. Estilo de vida  Siga una dieta saludable que incluya abundantes frutas, verduras, cereales integrales, productos lcteos descremados y protenas magras. No consuma muchos alimentos con alto contenido de grasas, azcares agregados o sal (sodio). Opte por cosas que le simplifiquen la vida. No consuma ningn producto que contenga nicotina o tabaco. Estos productos incluyen cigarrillos, tabaco para Higher education careers adviser y aparatos de vapeo, como los Psychologist, sport and exercise. Si necesita ayuda para dejar de fumar, consulte al mdico. Evite el consumo de cafena, alcohol y ciertos medicamentos contra el resfro de venta sin receta. Estos podran Engineer, building services. Pregntele al farmacutico qu medicamentos no debera tomar. Instrucciones generales Use los medicamentos de venta libre y los recetados solamente como se lo haya indicado el mdico. Concurra a todas las visitas de seguimiento. Esto es importante. Dnde buscar apoyo Puede conseguir ayuda y National [employer] siguientes lugares: Grupos de [place]. Organizaciones comunitarias y en lnea. Un lder espiritual de confianza. Terapia de pareja. Clases de educacin familiar. Terapia familiar. Dnde obtener ms informacin Formar parte de un grupo de apoyo podra resultarle til para enfrentar la ansiedad. Las siguientes fuentes pueden ayudarlo a Orthoptist consejeros o grupos de apoyo cerca de su hogar: [place] (Salud Mental de los [place]idos): www.mentalhealthamerica.net Anxiety and Depression Association of [place] [ADAA] (Asociacin de Ansiedad y Depresin de los Estados Unidos): https://www.clark.net/ National Alliance on Mental Illness [NAMI] ([place]): www.nami.org Comunquese con un mdico  si:  Le resulta difcil permanecer concentrado o finalizar las tareas diarias. Pasa muchas horas por da sintindose preocupado por la vida cotidiana. La preocupacin le provoca un cansancio extremo. Comienza a tener dolores de [place] o se siente tenso con frecuencia. Tiene nuseas crnicas y/o diarrea. Solicite ayuda de inmediato si: Se le acelera la frecuencia cardaca y Industrial/product designer. Piensa acerca de lastimarse a usted misma o a Producer, television/film/video. Si alguna vez siente que puede lastimarse o Market researcher a Producer, television/film/video, o tiene pensamientos de poner fin a su vida, busque ayuda de inmediato. Dirjase al servicio de urgencias ms cercano o: Comunquese con el servicio de emergencias de su localidad (911 en los Estados Unidos). Llame a una lnea de asistencia al suicida y atencin en crisis como National Suicide Prevention Lifeline ([place]) al [phone]. Est disponible las 24 horas del da en los EE. UU. Enve un mensaje de texto a la lnea para casos de crisis al [phone] (en los EE. UU.). Resumen Tomar medidas para aprender y usar tcnicas de reduccin de la tensin pueden ayudarlo a calmarse y a Conservation officer, historic buildings reaccin de ansiedad. Cuando se usan juntos, los medicamentos, la psicoterapia y las tcnicas de reduccin de la tensin pueden ser el tratamiento ms efectivo. Southwest Airlines, los amigos y las parejas pueden desempear un papel importante para brindarle apoyo. Esta informacin no tiene Marine scientist el consejo del mdico. Asegrese de hacerle al mdico cualquier pregunta que tenga. Document Revised: 12/23/2020 Document Reviewed: 12/23/2020 Elsevier Patient Education  [place].   Fascitis plantar Plantar Fasciitis  La fascitis plantar es una afeccin dolorosa que se produce en el taln. Ocurre cuando la banda de tejido que Exelon Corporation dedos con el hueso del taln (fascia plantar) se irrita. Esto puede ocurrir por Financial planner  ejercicio u otras actividades repetitivas (lesin por uso excesivo). La fascitis plantar puede causar desde una leve irritacin hasta dolor intenso que dificulta que la persona camine o se [place]. Por lo general, el dolor es peor a la maana despus de dormir, o despus de Public affairs consultant sentado o acostado durante un perodo de [place]. El dolor tambin puede empeorar despus de caminar o estar de pie por The PNC Financial. Cules son las causas? Esta afeccin puede ser causada por lo siguiente: Estar de pie durante largos perodos. Usar zapatos que no tengan un buen soporte para el arco. Realizar actividades que implican esfuerzo para las articulaciones (actividades de alto impacto). Esto incluye el ballet y la actividad fsica que hace que el corazn lata ms rpido (ejercicio aerbico), Solicitor. Tener sobrepeso. Tener una forma de caminar (andar) anormal. Presentar rigidez muscular en la parte posterior de la parte inferior de la pierna (pantorrilla). Arcos Standard Pacific o pies planos. Comenzar una nueva actividad fsica. Cules son los signos o sntomas? El sntoma principal de esta afeccin es el dolor en el taln. El dolor puede empeorar despus de lo siguiente: Con los primeros pasos luego de estar en reposo, especialmente por la maana despus de dormir o de haber estado sentado o acostado durante un [place]. Largos perodos de Public affairs consultant de pie. El dolor puede disminuir despus de 30 a 45 minutos de [place], como caminar apaciblemente. Cmo se diagnostica? Esta afeccin se puede diagnosticar en funcin de los antecedentes mdicos, un examen fsico y los sntomas. El mdico controlar lo siguiente: Un rea dolorida en la parte inferior del pie. [place] alto en el pie o pies planos. [place] al Film/video editor  pie. Dificultad para mover el pie. Pueden realizarle estudios de diagnstico por imagen para confirmar el diagnstico, por ejemplo: Radiografas. Ecografa. Resonancia magntica  (RM). Cmo se trata? El tratamiento de la fascitis plantar depende de la gravedad de su afeccin. El tratamiento puede incluir: Reposo, hielo, presin (compresin) y Lexicographer (elevar) el pie afectado. Esto se denomina tratamiento de RHCE (reposo, hielo, compresin, elevacin). El mdico puede recomendarle terapia de RHCE junto con medicamentos de venta libre para Best boy. Ejercicios para [place] pantorrillas y la fascia plantar. Una frula que LandAmerica Financial estirado y [place] mientras usted duerme (frula nocturna). Fisioterapia para UAL Corporation sntomas y Customer service manager en el futuro. Inyecciones de medicamentos con corticoesteroides (cortisona) para Best boy y la inflamacin. Estimular su fascia plantar lesionada con impulsos elctricos (tratamiento con ondas de choque extracorpreas). Esto generalmente es la ltima opcin de tratamiento antes de la [place]. [place], si los otros tratamientos no han funcionado despus de 12 meses. Siga estas instrucciones en su casa: Control del dolor, la rigidez y la hinchazn  Si se lo indican, aplique hielo sobre la zona dolorida. Para hacer esto: Ponga el hielo en una bolsa de plstico o use una botella de [place]. Coloque una toalla entre la piel y la bolsa de hielo o la botella. Frote la parte inferior del pie sobre la bolsa o la botella. Haga esto durante 20 minutos, de 2 a 3 veces al SunTrust. Use calzado deportivo con amortiguacin de aire o gel, o pruebe usar plantillas blandas diseadas para la fascitis plantar. Cuando est sentado o acostado, eleve el pie por encima del nivel del corazn. Actividad Evite las actividades que le causan dolor. Pregntele al mdico qu actividades son seguras para usted. Haga los ejercicios de fisioterapia y estiramiento como se lo haya indicado el mdico. Intente hacer actividades y tipos de ejercicio que sean ms suaves para las articulaciones (de bajo impacto). Por ejemplo, nadar,  hacer ejercicios OfficeMax Incorporated, y Catering manager. Instrucciones generales Use los medicamentos de venta libre y los recetados solamente como se lo haya indicado el mdico. Si el mdico se lo indica, use una frula nocturna para dormir. Afloje la frula si los dedos de los pies se le entumecen, siente hormigueos o se le enfran y se tornan de Optician, dispensing. Mantenga un peso saludable, o colabore con su mdico para perder Liberty Media. Cumpla con todas las visitas de seguimiento. Esto es importante. Comunquese con un mdico si tiene: Sntomas que no desaparecen con Teacher, early years/pre. Dolor que [place]. Dolor que afecta su capacidad de moverse o de Optometrist sus actividades diarias. Resumen La fascitis plantar es una afeccin dolorosa que se produce en el taln. Ocurre cuando la banda de tejido que Exelon Corporation dedos con el hueso del taln (fascia plantar) se irrita. El dolor en el taln es el sntoma principal de esta afeccin. Puede empeorar despus de Buyer, retail ejercicio o de Public affairs consultant quieto de pie durante mucho tiempo. El tratamiento vara, pero normalmente comienza con el reposo, la aplicacin de hielo, la aplicacin de presin (compresin) y la elevacin del pie afectado. Esto se denomina tratamiento de RHCE (reposo, hielo, compresin, elevacin). Tambin se pueden usar analgsicos de venta libre para Financial controller. Esta informacin no tiene Marine scientist el consejo del mdico. Asegrese de hacerle al mdico cualquier pregunta que tenga. Document Revised: 02/16/2020 Document Reviewed: 02/16/2020 Elsevier Patient Education  [place].

## 2022-02-09 NOTE — Progress Notes (Signed)
Patient ID: Eileen [name], female   DOB: 02-18-1971, 51 y.o.   MRN: [id number]     Eileen [name], is a 51 y.o. female  [id number]  [id number]  DOB - 04/01/1971  Chief Complaint  Patient presents with   Foot Pain       Subjective:   Eileen [name] at ED [facility] for foot pain.  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  Nerve conduction studies pending for CTS.  He has not had f/up with ortho.    She as a positive depression screening.  #9 phq9=1.  Fleeting thoughts of "I wish I wasn't here."  She denies intent or plan.  She says she only took meds prescribed previously for 2 days.  No weapons in the home.  Protective factors are family, faith, willingness to ask for help.    From ED note A/P: Patient with chronic bilateral feet pain. She was seen by Ortho in Feb and recommended for nerve conduction studies for her CTS, also given stretches for plantar fasciitis. She does not have any acute complaints today that I can discern. Recommend she continue with NSAIDs and follow up with PCP and/or Ortho.  is a 51 y.o. female here today for a follow up visit and   No problems updated.  ALLERGIES: No Known Allergies  PAST MEDICAL HISTORY: Past Medical History:  Diagnosis Date   Allergy    Pollen    Arthritis    Asthma    CHF (congestive heart failure) ([place])    "unsure thinks lungs had fluid around after surgery 7 years ago"   Chronic kidney disease    kidney infection   Neuromuscular disorder ([place])    HERNIA   Right inguinal hernia    Shortness of breath     MEDICATIONS AT HOME: Prior to Admission medications   Medication Sig Start Date End Date Taking? Authorizing Provider  meloxicam (MOBIC) 15 MG tablet Take 1 tablet (15 mg total) by mouth daily. 02/09/22  Yes Argentina Donovan, PA-C  acetaminophen (TYLENOL) 500 MG tablet Take 500 mg by mouth every 6 (six) hours as needed for mild pain, headache or fever. Patient not taking:  Reported on 02/09/2022    [provider]  albuterol (VENTOLIN HFA) 108 (90 Base) MCG/ACT inhaler INHALE 2 PUFFS INTO THE LUNGS EVERY 6 (SIX) HOURS AS NEEDED FOR WHEEZING OR SHORTNESS OF BREATH. 02/09/22 03/11/22  Argentina Donovan, PA-C  busPIRone (BUSPAR) 10 MG tablet Take 1 tablet (10 mg total) by mouth 2 (two) times daily. 02/09/22   Argentina Donovan, PA-C  cetirizine (ZYRTEC) 10 MG tablet Take 10 mg by mouth daily as needed for allergies. Patient not taking: Reported on 02/09/2022    [provider]  FLUoxetine (PROZAC) 20 MG capsule Take 1 capsule (20 mg total) by mouth daily. 02/09/22   Argentina Donovan, PA-C  gabapentin (NEURONTIN) 300 MG capsule Take 1 capsule (300 mg total) by mouth at bedtime. 02/09/22   Argentina Donovan, PA-C  hydrOXYzine (ATARAX) 25 MG tablet Take 1 tablet (25 mg total) by mouth 2 (two) times daily as needed. 02/09/22   Argentina Donovan, PA-C  mometasone-formoterol (DULERA) 100-5 MCG/ACT AERO Inhale 2 puffs into the lungs in the morning and at bedtime. 02/09/22   Argentina Donovan, PA-C  montelukast (SINGULAIR) 10 MG tablet Take 1 tablet (10 mg total) by mouth at bedtime. 02/09/22   Argentina Donovan, PA-C    ROS: Neg HEENT Neg resp  Neg cardiac Neg GI Neg GU Neg psych Neg neuro  Objective:   Vitals:   02/09/22 1043  BP: 116/76  Pulse: 69  SpO2: 92%  Weight: 200 lb 9.6 oz (91 kg)   Exam General appearance : Awake, alert, not in any distress. Speech Clear. Not toxic looking HEENT: Atraumatic and Normocephalic Neck: Supple, no JVD. No cervical lymphadenopathy.  Chest: Good air entry bilaterally, CTAB.  No rales/rhonchi/wheezing CVS: S1 S2 regular, no murmurs.  Extremities: B/L Lower Ext shows no edema, both legs are warm to touch Neurology: Awake alert, and oriented X 3, CN II-XII intact, Non focal Skin: No Rash  Data Review Lab Results  Component Value Date   HGBA1C 5.5 11/18/2015    Assessment & Plan   1. Plantar  fasciitis -back to ortho-lost to f/up-mention of orthotics.   - gabapentin (NEURONTIN) 300 MG capsule; Take 1 capsule (300 mg total) by mouth at bedtime.  Dispense: 30 capsule; Refill: 3 - meloxicam (MOBIC) 15 MG tablet; Take 1 tablet (15 mg total) by mouth daily.  Dispense: 30 tablet; Refill: 3  2. Bilateral carpal tunnel syndrome -back to ortho - meloxicam (MOBIC) 15 MG tablet; Take 1 tablet (15 mg total) by mouth daily.  Dispense: 30 tablet; Refill: 3  3. Language barrier AMN interpreters "[place]"used and additional time performing visit was required.   4. Anxiety and depression Counseled on self-care, relaxation technigues - FLUoxetine (PROZAC) 20 MG capsule; Take 1 capsule (20 mg total) by mouth daily.  Dispense: 30 capsule; Refill: 6 - hydrOXYzine (ATARAX) 25 MG tablet; Take 1 tablet (25 mg total) by mouth 2 (two) times daily as needed.  Dispense: 60 tablet; Refill: 1 - busPIRone (BUSPAR) 10 MG tablet; Take 1 tablet (10 mg total) by mouth 2 (two) times daily.  Dispense: 60 tablet; Refill: 3  5. Other insomnia - hydrOXYzine (ATARAX) 25 MG tablet; Take 1 tablet (25 mg total) by mouth 2 (two) times daily as needed.  Dispense: 60 tablet; Refill: 1  6. Mild intermittent asthma without complication - mometasone-formoterol (DULERA) 100-5 MCG/ACT AERO; Inhale 2 puffs into the lungs in the morning and at bedtime.  Dispense: 13 g; Refill: 3 - montelukast (SINGULAIR) 10 MG tablet; Take 1 tablet (10 mg total) by mouth at bedtime.  Dispense: 30 tablet; Refill: 3 - albuterol (VENTOLIN HFA) 108 (90 Base) MCG/ACT inhaler; INHALE 2 PUFFS INTO THE LUNGS EVERY 6 (SIX) HOURS AS NEEDED FOR WHEEZING OR SHORTNESS OF BREATH.  Dispense: 18 g; Refill: 3  7. Positive depression screening Gave resources and resumed medications bc she only took them 2 days last time.  She contracts for safety    Return in about 2 months (around 04/11/2022) for PCP to recheck asthma and depression.  The patient was given  clear instructions to go to ER or return to medical center if symptoms don't improve, worsen or new problems develop. The patient verbalized understanding. The patient was told to call to get lab results if they haven't heard anything in the next week.      Freeman Caldron, PA-C [facility] and [facility] [place], [place]   02/09/2022, 10:56 AM

## 2022-02-21 ENCOUNTER — Ambulatory Visit (INDEPENDENT_AMBULATORY_CARE_PROVIDER_SITE_OTHER): Payer: Self-pay | Admitting: Orthopaedic Surgery

## 2022-02-21 DIAGNOSIS — M25532 Pain in left wrist: Secondary | ICD-10-CM

## 2022-02-21 DIAGNOSIS — M25531 Pain in right wrist: Secondary | ICD-10-CM

## 2022-02-21 DIAGNOSIS — M722 Plantar fascial fibromatosis: Secondary | ICD-10-CM

## 2022-02-21 MED ORDER — BUPIVACAINE HCL 0.5 % IJ SOLN
1.0000 mL | INTRAMUSCULAR | Status: AC | PRN
  Administered 2022-02-21: 1 mL

## 2022-02-21 MED ORDER — METHYLPREDNISOLONE ACETATE 40 MG/ML IJ SUSP
40.0000 mg | INTRAMUSCULAR | Status: AC | PRN
  Administered 2022-02-21: 40 mg

## 2022-02-21 MED ORDER — LIDOCAINE HCL 1 % IJ SOLN
1.0000 mL | INTRAMUSCULAR | Status: AC | PRN
  Administered 2022-02-21: 1 mL

## 2022-03-09 ENCOUNTER — Other Ambulatory Visit: Payer: Self-pay

## 2022-03-10 ENCOUNTER — Other Ambulatory Visit: Payer: Self-pay

## 2022-04-04 ENCOUNTER — Encounter: Payer: Self-pay | Admitting: Physical Medicine and Rehabilitation

## 2022-04-04 ENCOUNTER — Ambulatory Visit (INDEPENDENT_AMBULATORY_CARE_PROVIDER_SITE_OTHER): Payer: Self-pay | Admitting: Physical Medicine and Rehabilitation

## 2022-04-04 DIAGNOSIS — R202 Paresthesia of skin: Secondary | ICD-10-CM

## 2022-04-04 NOTE — Progress Notes (Unsigned)
Pt state pain, tingling and weakness in both hand and wrist, mostly her right hand. Pt state she has weakness and have been dropping things. Pt state she takes pain meds and uses a brace. Pt state she right handed.  Numeric Pain Rating Scale and Functional Assessment Average Pain 4   In the last MONTH (on 0-10 scale) has pain interfered with the following?  1. General activity like being  able to carry out your everyday physical activities such as walking, climbing stairs, carrying groceries, or moving a chair?  Rating(8)   -BT,

## 2022-04-06 ENCOUNTER — Telehealth: Payer: Self-pay | Admitting: Family Medicine

## 2022-04-06 ENCOUNTER — Ambulatory Visit (INDEPENDENT_AMBULATORY_CARE_PROVIDER_SITE_OTHER): Payer: Self-pay | Admitting: Orthopaedic Surgery

## 2022-04-06 DIAGNOSIS — G5601 Carpal tunnel syndrome, right upper limb: Secondary | ICD-10-CM

## 2022-04-06 DIAGNOSIS — M722 Plantar fascial fibromatosis: Secondary | ICD-10-CM

## 2022-04-06 NOTE — Procedures (Signed)
EMG & NCV Findings: Evaluation of the right median motor nerve showed prolonged distal onset latency (4.3 ms) and decreased conduction velocity (Elbow-Wrist, 40 m/s).  The left median (across palm) sensory nerve showed prolonged distal peak latency (Palm, 3.8 ms).  The right median (across palm) sensory nerve showed prolonged distal peak latency (Wrist, 4.0 ms) and prolonged distal peak latency (Palm, 4.6 ms).  All remaining nerves (as indicated in the following tables) were within normal limits.  Left vs. Right side comparison data for the median motor nerve indicates abnormal L-R latency difference (0.9 ms) and abnormal L-R velocity difference (Elbow-Wrist, 11 m/s).    Impression: The above electrodiagnostic study is ABNORMAL and reveals evidence of a moderate right median nerve entrapment at the wrist (carpal tunnel syndrome) affecting sensory and motor components. There is no significant electrodiagnostic evidence of any other focal nerve entrapment, brachial plexopathy or cervical radiculopathy.   Recommendations: 1.  Follow-up with referring physician. 2.  Continue current management of symptoms. 3.  Continue use of resting splint at night-time and as needed during the day. 4.  Suggest surgical evaluation.  ___________________________ Laurence Spates FAAPMR Board Certified, American Board of Physical Medicine and Rehabilitation    Nerve Conduction Studies Anti Sensory Summary Table   Stim Site NR Peak (ms) Norm Peak (ms) P-T Amp (V) Norm P-T Amp Site1 Site2 Delta-P (ms) Dist (cm) Vel (m/s) Norm Vel (m/s)  Left Median Acr Palm Anti Sensory (2nd Digit)  31.5C  Wrist    3.6 <3.6 36.0 >10 Wrist Palm 0.2 0.0    Palm    *3.8 <2.0 4.9         Right Median Acr Palm Anti Sensory (2nd Digit)  31.8C  Wrist    *4.0 <3.6 23.7 >10 Wrist Palm 0.6 0.0    Palm    *4.6 <2.0 26.1         Right Radial Anti Sensory (Base 1st Digit)  32.4C  Wrist    2.1 <3.1 40.9  Wrist Base 1st Digit 2.1 0.0    Right  Ulnar Anti Sensory (5th Digit)  32.4C  Wrist    3.0 <3.7 16.3 >15.0 Wrist 5th Digit 3.0 14.0 47 >38   Motor Summary Table   Stim Site NR Onset (ms) Norm Onset (ms) O-P Amp (mV) Norm O-P Amp Site1 Site2 Delta-0 (ms) Dist (cm) Vel (m/s) Norm Vel (m/s)  Left Median Motor (Abd Poll Brev)  32.9C  Wrist    3.4 <4.2 6.3 >5 Elbow Wrist 3.6 18.5 51 >50  Elbow    7.0  6.2         Right Median Motor (Abd Poll Brev)  32.4C    Martin-Gruber  Wrist    *4.3 <4.2 5.6 >5 Elbow Wrist 4.9 19.5 *40 >50  Elbow    9.2  6.0         Right Ulnar Motor (Abd Dig Min)  32.4C  Wrist    2.7 <4.2 6.0 >3 B Elbow Wrist 3.0 19.5 65 >53  B Elbow    5.7  7.4  A Elbow B Elbow 1.2 10.0 83 >53  A Elbow    6.9  7.4           Nerve Conduction Studies Anti Sensory Left/Right Comparison   Stim Site L Lat (ms) R Lat (ms) L-R Lat (ms) L Amp (V) R Amp (V) L-R Amp (%) Site1 Site2 L Vel (m/s) R Vel (m/s) L-R Vel (m/s)  Median Acr Palm Anti Sensory (2nd Digit)  31.5C  Wrist 3.6 *4.0 0.4 36.0 23.7 34.2 Wrist Palm     Palm *3.8 *4.6 0.8 4.9 26.1 81.2       Radial Anti Sensory (Base 1st Digit)  32.4C  Wrist  2.1   40.9  Wrist Base 1st Digit     Ulnar Anti Sensory (5th Digit)  32.4C  Wrist  3.0   16.3  Wrist 5th Digit  47    Motor Left/Right Comparison   Stim Site L Lat (ms) R Lat (ms) L-R Lat (ms) L Amp (mV) R Amp (mV) L-R Amp (%) Site1 Site2 L Vel (m/s) R Vel (m/s) L-R Vel (m/s)  Median Motor (Abd Poll Brev)  32.9C  Wrist 3.4 *4.3 *0.9 6.3 5.6 11.1 Elbow Wrist 51 *40 *11  Elbow 7.0 9.2 2.2 6.2 6.0 3.2       Ulnar Motor (Abd Dig Min)  32.4C  Wrist  2.7   6.0  B Elbow Wrist  65   B Elbow  5.7   7.4  A Elbow B Elbow  83   A Elbow  6.9   7.4           Waveforms:

## 2022-04-06 NOTE — Progress Notes (Signed)
Office Visit Note   Patient: Eileen [name]           Date of Birth: 07-13-1971           MRN: [id number] Visit Date: 04/06/2022              Requested by: Charlott Rakes, MD [address] [place],  [place] [id number] PCP: Charlott Rakes, MD   Assessment & Plan: Visit Diagnoses:  1. Right carpal tunnel syndrome   2. Plantar fasciitis, bilateral     Plan: In regards to plan fasciitis she has felt improvement from the cortisone injections.  She will continue stretches and relative rest.  Understands that the natural course of the disease is self resolution.  In regards to the carpal tunnel syndrome nerve conductions reveal she has moderate carpal tunnel syndrome.  Results were reviewed with the patient and treatment options were reviewed and based on her options she has elected to move forward with a right carpal tunnel release soon as possible.  Risk benefits prognosis reviewed as well.  [name] met with the patient today.  Interpreter present  Follow-Up Instructions: No follow-ups on file.   Orders:  No orders of the defined types were placed in this encounter.  No orders of the defined types were placed in this encounter.     Procedures: No procedures performed   Clinical Data: No additional findings.   Subjective: Chief Complaint  Patient presents with   Left Wrist - Pain   Right Wrist - Pain    HPI Patient returns today for follow-up of bilateral planter fasciitis and review of recent nerve conduction studies.  Interpreter present today.  Review of Systems  Constitutional: Negative.   HENT: Negative.    Eyes: Negative.   Respiratory: Negative.    Cardiovascular: Negative.   Endocrine: Negative.   Musculoskeletal: Negative.   Neurological: Negative.   Hematological: Negative.   Psychiatric/Behavioral: Negative.    All other systems reviewed and are negative.    Objective: Vital Signs: There were no vitals taken for this  visit.  Physical Exam Vitals and nursing note reviewed.  Constitutional:      Appearance: She is well-developed.  HENT:     Head: Normocephalic and atraumatic.  Pulmonary:     Effort: Pulmonary effort is normal.  Abdominal:     Palpations: Abdomen is soft.  Musculoskeletal:     Cervical back: Neck supple.  Skin:    General: Skin is warm.     Capillary Refill: Capillary refill takes less than 2 seconds.  Neurological:     Mental Status: She is alert and oriented to person, place, and time.  Psychiatric:        Behavior: Behavior normal.        Thought Content: Thought content normal.        Judgment: Judgment normal.     Ortho Exam Examinations of the feet and right hand are unchanged. Specialty Comments:  No specialty comments available.  Imaging: No results found.   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   Current smoker 09/26/2013   Past Medical  History:  Diagnosis Date   Allergy    Pollen    Arthritis    Asthma    CHF (congestive heart failure) ([facility])    "unsure thinks lungs had fluid around after surgery 7 years ago"   Chronic kidney disease    kidney infection   Neuromuscular disorder ([facility])    HERNIA   Right inguinal hernia    Shortness of breath     Family History  Problem Relation Age of Onset   Ulcers Father        stomach   Diabetes Brother    Esophageal cancer Nephew    Diabetes Brother    Colon cancer Neg Hx    Rectal cancer Neg Hx    Stomach cancer Neg Hx    Breast cancer Neg Hx    Colon polyps Neg Hx     Past Surgical History:  Procedure Laterality Date    CESAREAN SECTION     x2   CESAREAN SECTION     COLONOSCOPY     HERNIA REPAIR  09/12/11   RIH   HERNIA REPAIR     INGUINAL HERNIA REPAIR  09/12/2011   Procedure: HERNIA REPAIR INGUINAL ADULT;  Surgeon: Gayland Curry, MD;  Location: [facility];  Service: General;  Laterality: Right;  open right inguinal hernia with mesh    POLYPECTOMY     TUBAL LIGATION     Social History   Occupational History   Occupation: Cleaning  Tobacco Use   Smoking status: Former    Packs/day: 0.50    Years: 20.00    Total pack years: 10.00    Types: Cigarettes   Smokeless tobacco: Never   Tobacco comments:    recently quit  Vaping Use   Vaping Use: Never used  Substance and Sexual Activity   Alcohol use: No   Drug use: No   Sexual activity: Yes    Birth control/protection: Surgical

## 2022-04-06 NOTE — Telephone Encounter (Signed)
Pt called in and stated she had an appointment today with an ortho. She was advised she may need surgery and is requesting assistance or help with payment. Pt then she mentioned she wanted to get a mammogram done and needed assistance with that as well. She said, "I've been dealing with a lot of depression, so I am just now taking care of things. I asked if she'd been feeling depression or anxiety, and she said yes, but she is on medication and is feeling somewhat better. I asked if she was okay if I could grab a nurse (NT) for her, and pt declined, stated that she was okay, and went back to asking me about financial assistance.  Sending as an Pharmacist, hospital.

## 2022-04-06 NOTE — Telephone Encounter (Signed)
Will mail [employer] to address on file.  Will need an apt to address referral for mammogram.

## 2022-04-06 NOTE — Progress Notes (Signed)
Eileen [name] - 51 y.o. female MRN [id number]  Date of birth: [birth year]  Office Visit Note: Visit Date: 04/04/2022 PCP: Charlott Rakes, MD Referred by: Leandrew Koyanagi, MD  Subjective: Chief Complaint  Patient presents with   Right Hand - Numbness, Weakness, Tingling, Pain   Left Hand - Pain, Weakness, Numbness, Tingling   Right Wrist - Pain, Weakness, Numbness, Tingling   Left Wrist - Pain, Numbness, Weakness, Tingling   HPI:  Eileen [name] is a 51 y.o. female who comes in today at the request of Dr. Eduard Roux for electrodiagnostic study of the Bilateral upper extremities.  Patient is Right hand dominant. She reports pain, tingling and weakness in both hand and wrist, mostly her right hand. Pt state she has weakness and has been dropping things.    ROS Otherwise per HPI.  Assessment & Plan: Visit Diagnoses:    ICD-10-CM   1. Paresthesia of skin  R20.2 NCV with EMG (electromyography)      Plan: Impression: The above electrodiagnostic study is ABNORMAL and reveals evidence of a moderate right median nerve entrapment at the wrist (carpal tunnel syndrome) affecting sensory and motor components. There is no significant electrodiagnostic evidence of any other focal nerve entrapment, brachial plexopathy or cervical radiculopathy.   Recommendations: 1.  Follow-up with referring physician. 2.  Continue current management of symptoms. 3.  Continue use of resting splint at night-time and as needed during the day. 4.  Suggest surgical evaluation.  Meds & Orders: No orders of the defined types were placed in this encounter.   Orders Placed This Encounter  Procedures   NCV with EMG (electromyography)    Follow-up: No follow-ups on file.   Procedures: No procedures performed  EMG & NCV Findings: Evaluation of the right median motor nerve showed prolonged distal onset latency (4.3 ms) and decreased conduction velocity (Elbow-Wrist, 40 m/s).  The left median (across  palm) sensory nerve showed prolonged distal peak latency (Palm, 3.8 ms).  The right median (across palm) sensory nerve showed prolonged distal peak latency (Wrist, 4.0 ms) and prolonged distal peak latency (Palm, 4.6 ms).  All remaining nerves (as indicated in the following tables) were within normal limits.  Left vs. Right side comparison data for the median motor nerve indicates abnormal L-R latency difference (0.9 ms) and abnormal L-R velocity difference (Elbow-Wrist, 11 m/s).    Impression: The above electrodiagnostic study is ABNORMAL and reveals evidence of a moderate right median nerve entrapment at the wrist (carpal tunnel syndrome) affecting sensory and motor components. There is no significant electrodiagnostic evidence of any other focal nerve entrapment, brachial plexopathy or cervical radiculopathy.   Recommendations: 1.  Follow-up with referring physician. 2.  Continue current management of symptoms. 3.  Continue use of resting splint at night-time and as needed during the day. 4.  Suggest surgical evaluation.  ___________________________ Laurence Spates FAAPMR Board Certified, American Board of Physical Medicine and Rehabilitation    Nerve Conduction Studies Anti Sensory Summary Table   Stim Site NR Peak (ms) Norm Peak (ms) P-T Amp (V) Norm P-T Amp Site1 Site2 Delta-P (ms) Dist (cm) Vel (m/s) Norm Vel (m/s)  Left Median Acr Palm Anti Sensory (2nd Digit)  31.5C  Wrist    3.6 <3.6 36.0 >10 Wrist Palm 0.2 0.0    Palm    *3.8 <2.0 4.9         Right Median Acr Palm Anti Sensory (2nd Digit)  31.8C  Wrist    *4.0 <3.6 23.7 >10 Wrist  Palm 0.6 0.0    Palm    *4.6 <2.0 26.1         Right Radial Anti Sensory (Base 1st Digit)  32.4C  Wrist    2.1 <3.1 40.9  Wrist Base 1st Digit 2.1 0.0    Right Ulnar Anti Sensory (5th Digit)  32.4C  Wrist    3.0 <3.7 16.3 >15.0 Wrist 5th Digit 3.0 14.0 47 >38   Motor Summary Table   Stim Site NR Onset (ms) Norm Onset (ms) O-P Amp (mV) Norm O-P  Amp Site1 Site2 Delta-0 (ms) Dist (cm) Vel (m/s) Norm Vel (m/s)  Left Median Motor (Abd Poll Brev)  32.9C  Wrist    3.4 <4.2 6.3 >5 Elbow Wrist 3.6 18.5 51 >50  Elbow    7.0  6.2         Right Median Motor (Abd Poll Brev)  32.4C    Martin-Gruber  Wrist    *4.3 <4.2 5.6 >5 Elbow Wrist 4.9 19.5 *40 >50  Elbow    9.2  6.0         Right Ulnar Motor (Abd Dig Min)  32.4C  Wrist    2.7 <4.2 6.0 >3 B Elbow Wrist 3.0 19.5 65 >53  B Elbow    5.7  7.4  A Elbow B Elbow 1.2 10.0 83 >53  A Elbow    6.9  7.4           Nerve Conduction Studies Anti Sensory Left/Right Comparison   Stim Site L Lat (ms) R Lat (ms) L-R Lat (ms) L Amp (V) R Amp (V) L-R Amp (%) Site1 Site2 L Vel (m/s) R Vel (m/s) L-R Vel (m/s)  Median Acr Palm Anti Sensory (2nd Digit)  31.5C  Wrist 3.6 *4.0 0.4 36.0 23.7 34.2 Wrist Palm     Palm *3.8 *4.6 0.8 4.9 26.1 81.2       Radial Anti Sensory (Base 1st Digit)  32.4C  Wrist  2.1   40.9  Wrist Base 1st Digit     Ulnar Anti Sensory (5th Digit)  32.4C  Wrist  3.0   16.3  Wrist 5th Digit  47    Motor Left/Right Comparison   Stim Site L Lat (ms) R Lat (ms) L-R Lat (ms) L Amp (mV) R Amp (mV) L-R Amp (%) Site1 Site2 L Vel (m/s) R Vel (m/s) L-R Vel (m/s)  Median Motor (Abd Poll Brev)  32.9C  Wrist 3.4 *4.3 *0.9 6.3 5.6 11.1 Elbow Wrist 51 *40 *11  Elbow 7.0 9.2 2.2 6.2 6.0 3.2       Ulnar Motor (Abd Dig Min)  32.4C  Wrist  2.7   6.0  B Elbow Wrist  65   B Elbow  5.7   7.4  A Elbow B Elbow  83   A Elbow  6.9   7.4           Waveforms:                 Clinical History: No specialty comments available.     Objective:  VS:  HT:    WT:   BMI:     BP:   HR: bpm  TEMP: ( )  RESP:  Physical Exam Musculoskeletal:        General: No swelling, tenderness or deformity.     Comments: Inspection reveals no atrophy of the bilateral APB or FDI or hand intrinsics. There is no swelling, color changes, allodynia or dystrophic changes. There is  5 out of 5 strength in the  bilateral wrist extension, finger abduction and long finger flexion. There is intact sensation to light touch in all dermatomal and peripheral nerve distributions. There is a positive Phalen's test bilaterally. There is a negative Hoffmann's test bilaterally.  Skin:    General: Skin is warm and dry.     Findings: No erythema or rash.  Neurological:     General: No focal deficit present.     Mental Status: She is alert and oriented to person, place, and time.     Motor: No weakness or abnormal muscle tone.     Coordination: Coordination normal.  Psychiatric:        Mood and Affect: Mood normal.        Behavior: Behavior normal.      Imaging: No results found.

## 2022-04-13 ENCOUNTER — Other Ambulatory Visit: Payer: Self-pay

## 2022-04-19 ENCOUNTER — Encounter: Payer: Self-pay | Admitting: Family Medicine

## 2022-04-19 ENCOUNTER — Ambulatory Visit: Payer: Self-pay | Attending: Family Medicine | Admitting: Family Medicine

## 2022-04-19 VITALS — BP 111/74 | HR 72 | Temp 98.4°F | Resp 16 | Ht 61.0 in | Wt 197.0 lb

## 2022-04-19 DIAGNOSIS — G5603 Carpal tunnel syndrome, bilateral upper limbs: Secondary | ICD-10-CM

## 2022-04-19 DIAGNOSIS — Z131 Encounter for screening for diabetes mellitus: Secondary | ICD-10-CM

## 2022-04-19 NOTE — Progress Notes (Signed)
Subjective:  Patient ID: Eileen [name], female    DOB: 05/09/1971  Age: 51 y.o. MRN: [id number]  CC: Follow-up (Patient says this is follow-up from ortho visit?)   HPI Eileen [name] is a 51 y.o. year old female with a history of asthma, depression, previous tobacco abuse (quit in 11/2020).  Interval History: She had an orthopedic visit on 04/06/2022 for right carpal tunnel syndrome and bilateral plantar fasciitis (status post cortisone injection).  She is being worked up for carpal tunnel release surgery.  Continues to have pain in her wrist, numbness. She states she is here to obtain Financial assistance to proceed with her surgery.  Her asthma, depression is stable and she is good on her refills. Past Medical History:  Diagnosis Date   Allergy    Pollen    Arthritis    Asthma    CHF (congestive heart failure) ([facility])    "unsure thinks lungs had fluid around after surgery 7 years ago"   Chronic kidney disease    kidney infection   Neuromuscular disorder ([facility])    HERNIA   Right inguinal hernia    Shortness of breath     Past Surgical History:  Procedure Laterality Date   CESAREAN SECTION     x2   CESAREAN SECTION     COLONOSCOPY     HERNIA REPAIR  09/12/11   RIH   HERNIA REPAIR     INGUINAL HERNIA REPAIR  09/12/2011   Procedure: HERNIA REPAIR INGUINAL ADULT;  Surgeon: Atilano Ina, MD;  Location: [facility] OR;  Service: General;  Laterality: Right;  open right inguinal hernia with mesh    POLYPECTOMY     TUBAL LIGATION      Family History  Problem Relation Age of Onset   Ulcers Father        stomach   Diabetes Brother    Esophageal cancer Nephew    Diabetes Brother    Colon cancer Neg Hx    Rectal cancer Neg Hx    Stomach cancer Neg Hx    Breast cancer Neg Hx    Colon polyps Neg Hx     Social History   Socioeconomic History   Marital status: Legally Separated    Spouse name: Not on file   Number of children: 4   Years of education: Not on file    Highest education level: 6th grade  Occupational History   Occupation: Cleaning  Tobacco Use   Smoking status: Former    Packs/day: 0.50    Years: 20.00    Total pack years: 10.00    Types: Cigarettes   Smokeless tobacco: Never   Tobacco comments:    recently quit  Vaping Use   Vaping Use: Never used  Substance and Sexual Activity   Alcohol use: No   Drug use: No   Sexual activity: Yes    Birth control/protection: Surgical  Other Topics Concern   Not on file  Social History Narrative   ** Merged History Encounter **       Social Determinants of Health   Financial Resource Strain: Not on file  Food Insecurity: Not on file  Transportation Needs: No Transportation Needs (05/06/2020)   PRAPARE - Administrator, Civil Service (Medical): No    Lack of Transportation (Non-Medical): No  Physical Activity: Not on file  Stress: Not on file  Social Connections: Not on file    No Known Allergies  Outpatient Medications Prior to Visit  Medication  Sig Dispense Refill   albuterol (PROAIR HFA) 108 (90 Base) MCG/ACT inhaler INHALE 2 PUFFS INTO THE LUNGS EVERY 6 (SIX) HOURS AS NEEDED FOR WHEEZING OR SHORTNESS OF BREATH. 8.5 g 3   mometasone-formoterol (DULERA) 100-5 MCG/ACT AERO Inhale 2 puffs into the lungs in the morning and at bedtime. 13 g 3   montelukast (SINGULAIR) 10 MG tablet Take 1 tablet (10 mg total) by mouth at bedtime. 30 tablet 3   acetaminophen (TYLENOL) 500 MG tablet Take 500 mg by mouth every 6 (six) hours as needed for mild pain, headache or fever.     busPIRone (BUSPAR) 10 MG tablet Take 1 tablet (10 mg total) by mouth 2 (two) times daily. 60 tablet 3   cetirizine (ZYRTEC) 10 MG tablet Take 10 mg by mouth daily as needed for allergies.     FLUoxetine (PROZAC) 20 MG capsule Take 1 capsule (20 mg total) by mouth daily. 30 capsule 6   gabapentin (NEURONTIN) 300 MG capsule Take 1 capsule (300 mg total) by mouth at bedtime. 30 capsule 3   hydrOXYzine (ATARAX) 25  MG tablet Take 1 tablet (25 mg total) by mouth 2 (two) times daily as needed. 60 tablet 1   meloxicam (MOBIC) 15 MG tablet Take 1 tablet (15 mg total) by mouth daily. 30 tablet 3   No facility-administered medications prior to visit.     ROS Review of Systems  Constitutional:  Negative for activity change and appetite change.  HENT:  Negative for sinus pressure and sore throat.   Respiratory:  Negative for chest tightness, shortness of breath and wheezing.   Cardiovascular:  Negative for chest pain and palpitations.  Gastrointestinal:  Negative for abdominal distention, abdominal pain and constipation.  Genitourinary: Negative.   Musculoskeletal:        See HPI  Neurological:  Positive for numbness.  Psychiatric/Behavioral:  Negative for behavioral problems and dysphoric mood.     Objective:  BP 111/74 (BP Location: Left Arm, Patient Position: Sitting, Cuff Size: Large)   Pulse 72   Temp 98.4 F (36.9 C)   Resp 16   Ht $R'5\' 1"'eZ$  (1.549 m)   Wt 197 lb (89.4 kg)   SpO2 98%   BMI 37.22 kg/m      04/19/2022    2:46 PM 02/09/2022   10:43 AM 12/31/2021   12:44 AM  BP/Weight  Systolic BP 102 725 366  Diastolic BP 74 76 63  Wt. (Lbs) 197 200.6   BMI 37.22 kg/m2 39.18 kg/m2       Physical Exam Constitutional:      Appearance: She is well-developed.  Cardiovascular:     Rate and Rhythm: Normal rate.     Heart sounds: Normal heart sounds. No murmur heard. Pulmonary:     Effort: Pulmonary effort is normal.     Breath sounds: Normal breath sounds. No wheezing or rales.  Chest:     Chest wall: No tenderness.  Abdominal:     General: Bowel sounds are normal. There is no distension.     Palpations: Abdomen is soft. There is no mass.     Tenderness: There is no abdominal tenderness.  Musculoskeletal:        General: Normal range of motion.     Right lower leg: No edema.     Left lower leg: No edema.  Neurological:     Mental Status: She is alert and oriented to person,  place, and time.  Psychiatric:  Mood and Affect: Mood normal.        Latest Ref Rng & Units 12/08/2020   10:10 PM 08/04/2020    7:39 PM 10/22/2019    5:06 PM  CMP  Glucose 70 - 99 mg/dL 119  80  82   BUN 6 - 20 mg/dL $Remove'14  22  15   'SQFnNsg$ Creatinine 0.44 - 1.00 mg/dL 0.95  0.89  0.78   Sodium 135 - 145 mmol/L 138  139  142   Potassium 3.5 - 5.1 mmol/L 3.7  3.7  4.2   Chloride 98 - 111 mmol/L 107  107  107   CO2 22 - 32 mmol/L $RemoveB'25  25  20   'hnMuamhS$ Calcium 8.9 - 10.3 mg/dL 9.2  8.8  9.3     Lipid Panel     Component Value Date/Time   CHOL 170 11/18/2015 0942   TRIG 85 11/18/2015 0942   HDL 48 11/18/2015 0942   CHOLHDL 3.5 11/18/2015 0942   CHOLHDL 4.0 Ratio 06/28/2010 2048   VLDL 26 06/28/2010 2048   LDLCALC 105 (H) 11/18/2015 0942    CBC    Component Value Date/Time   WBC 9.1 12/08/2020 2210   RBC 4.08 12/08/2020 2210   HGB 12.8 12/08/2020 2210   HGB 13.7 09/06/2018 1230   HCT 38.0 12/08/2020 2210   HCT 40.2 09/06/2018 1230   PLT 270 12/08/2020 2210   PLT 304 09/06/2018 1230   MCV 93.1 12/08/2020 2210   MCV 92 09/06/2018 1230   MCH 31.4 12/08/2020 2210   MCHC 33.7 12/08/2020 2210   RDW 13.3 12/08/2020 2210   RDW 13.1 09/06/2018 1230   LYMPHSABS 2.7 08/04/2020 1939   MONOABS 1.2 (H) 08/04/2020 1939   EOSABS 0.1 08/04/2020 1939   BASOSABS 0.1 08/04/2020 1939    Lab Results  Component Value Date   HGBA1C 5.5 11/18/2015    Assessment & Plan:  1. Bilateral carpal tunnel syndrome Uncontrolled on conservative management Currently being worked up for carpal tunnel release surgery Advised to obtain paperwork for the [facility] discount at the front desk.   2. Screening for diabetes mellitus - CMP14+EGFR - Hemoglobin A1c   No orders of the defined types were placed in this encounter.   Follow-up: Return in about 6 months (around 10/20/2022) for Chronic medical conditions.       Charlott Rakes, MD, FAAFP. [facility] and [place], [place]   04/19/2022, 5:00 PM

## 2022-04-19 NOTE — Patient Instructions (Signed)
Sndrome del tnel carpiano Carpal Tunnel Syndrome  El sndrome del tnel carpiano es una afeccin que causa dolor, adormecimiento y debilidad en la mano y los dedos. El tnel carpiano es un rea estrecha ubicada en el lado palmar de la mueca. Los movimientos repetidos de la mueca o determinadas enfermedades pueden causar la hinchazn del tnel. Esta hinchazn comprime el nervio principal de la mueca. El nervio principal de la mueca se llama "nervio mediano". Cules son las causas? Esta afeccin puede ser causada por lo siguiente: Movimientos repetidos y enrgicos de la mueca y la mano. Lesiones en la mueca. Artritis. Un quiste o un tumor en el tnel carpiano. Acumulacin de lquido durante el embarazo. Uso de herramientas que vibran. A veces, se desconoce la causa de esta afeccin. Qu incrementa el riesgo? Los siguientes factores pueden hacer que sea ms propenso a desarrollar esta afeccin: Tener un trabajo que requiera que mueva la mueca o la mano repetitivamente o enrgicamente o que utilice herramientas que vibran. Estos pueden ser, entre otros, los trabajos que implican usar una computadora, trabajar en una lnea de ensamblaje o trabajar con herramientas elctricas como taladros y lijadoras. Ser mujer. Tener ciertas afecciones, tales como: Diabetes. Obesidad. Tiroides hipoactiva (hipotiroidismo). Insuficiencia renal. Artritis reumatoide. Cules son los signos o sntomas? Los sntomas de esta afeccin incluyen: Sensacin de hormigueo en los dedos de la mano, especialmente el pulgar, el ndice y el dedo medio. Hormigueo o adormecimiento en la mano. Sensacin de dolor en todo el brazo, especialmente cuando la mueca y el codo estn flexionados durante mucho tiempo. Dolor en la mueca que sube por el brazo hasta el hombro. Dolor que baja hasta la palma de la mano o los dedos. Sensacin de debilidad en las manos. Tal vez tenga dificultad para tomar y sostener objetos. Los  sntomas pueden empeorar durante la noche. Cmo se diagnostica? Esta afeccin se diagnostica mediante los antecedentes mdicos y un examen fsico. Tambin pueden hacerle estudios, que incluyen los siguientes: Un electromiograma (EMG). Esta prueba mide las seales elctricas que los nervios les envan a los msculos. Estudio de conduccin nerviosa. Este estudio permite determinar si las seales elctricas pasan correctamente por los nervios. Estudios de diagnstico por imgenes, como radiografas, una ecografa y una resonancia magntica (RM). Estos estudios permiten detectar las posibles causas de la afeccin. Cmo se trata? El tratamiento de esta afeccin puede incluir: Cambios en el estilo de vida. Es importante que deje o cambie la actividad que caus la afeccin. Hacer ejercicio y actividades para fortalecer y estirar los msculos y los tendones (fisioterapia). Hacer cambios en el estilo de vida que lo ayuden con su afeccin y aprender a realizar sus actividades diarias de forma segura (terapia ocupacional). Analgsicos y antiinflamatorios. Esto puede incluir medicamentos que se inyectan en la mueca. Una frula o un dispositivo ortopdico para la mueca. Ciruga. Siga estas instrucciones en su casa: Si tiene una frula o un dispositivo ortopdico: Use la frula o el dispositivo ortopdico como se lo haya indicado el mdico. Quteselos solamente como se lo haya indicado el mdico. Afloje la frula o el dispositivo ortopdico si los dedos de las manos se le adormecen, siente hormigueos o se le enfran y se tornan de color azul. Mantenga la frula o el dispositivo ortopdico limpios. Si la frula o el dispositivo ortopdico no son impermeables: No deje que se mojen. Cbralos con un envoltorio hermtico cuando tome un bao de inmersin o una ducha. Control del dolor, la rigidez y la hinchazn Si se lo   indican, aplique hielo sobre la zona dolorida. Para hacer esto: Si tiene una frula o un  dispositivo ortopdico desmontable, quteselos como se lo haya indicado el mdico. Ponga el hielo en una bolsa plstica. Coloque una toalla entre la piel y la bolsa, o entre la frula o dispositivo ortopdico y la bolsa. Aplique el hielo durante 20 minutos, 2 o 3 veces por da. No se quede dormido con la bolsa de hielo sobre la piel. Retire el hielo si la piel se pone de color rojo brillante. Esto es muy importante. Si no puede sentir dolor, calor o fro, tiene un mayor riesgo de que se dae la zona. Mueva los dedos con frecuencia para reducir la rigidez y la hinchazn. Instrucciones generales Use los medicamentos de venta libre y los recetados solamente como se lo haya indicado el mdico. Descanse la mueca y la mano de toda actividad que le cause dolor. Si la afeccin tiene relacin con el trabajo, hable con su empleador sobre los cambios que pueden hacerse, por ejemplo, usar una almohadilla para apoyar la mueca mientras tipea. Haga los ejercicios como se lo hayan indicado el mdico, el fisioterapeuta o el terapeuta ocupacional. Cumpla con todas las visitas de seguimiento. Esto es importante. Comunquese con un mdico si: Aparecen nuevos sntomas. El dolor no se alivia con los medicamentos. Sus sntomas empeoran. Solicite ayuda de inmediato si: Tiene hormigueo o adormecimiento intensos en la mueca o la mano. Resumen El sndrome del tnel carpiano es una afeccin que causa dolor, adormecimiento y debilidad en la mano y los dedos. Generalmente se debe a movimientos repetidos de la mueca. El sndrome del tnel carpiano se trata mediante cambios en el estilo de vida y medicamentos. Tambin puede indicarse la ciruga. Siga las instrucciones del mdico sobre el uso de una frula, el descanso de la actividad, la asistencia a las visitas de seguimiento y llamar para pedir ayuda. Esta informacin no tiene como fin reemplazar el consejo del mdico. Asegrese de hacerle al mdico cualquier pregunta  que tenga. Document Revised: 01/30/2020 Document Reviewed: 01/30/2020 Elsevier Patient Education  2023 Elsevier Inc.  

## 2022-04-20 LAB — CMP14+EGFR
ALT: 33 IU/L — ABNORMAL HIGH (ref 0–32)
AST: 20 IU/L (ref 0–40)
Albumin/Globulin Ratio: 1.9 (ref 1.2–2.2)
Albumin: 4.3 g/dL (ref 3.9–4.9)
Alkaline Phosphatase: 116 IU/L (ref 44–121)
BUN/Creatinine Ratio: 24 — ABNORMAL HIGH (ref 9–23)
BUN: 19 mg/dL (ref 6–24)
Bilirubin Total: 0.3 mg/dL (ref 0.0–1.2)
CO2: 20 mmol/L (ref 20–29)
Calcium: 9.8 mg/dL (ref 8.7–10.2)
Chloride: 103 mmol/L (ref 96–106)
Creatinine, Ser: 0.79 mg/dL (ref 0.57–1.00)
Globulin, Total: 2.3 g/dL (ref 1.5–4.5)
Glucose: 138 mg/dL — ABNORMAL HIGH (ref 70–99)
Potassium: 4.3 mmol/L (ref 3.5–5.2)
Sodium: 140 mmol/L (ref 134–144)
Total Protein: 6.6 g/dL (ref 6.0–8.5)
eGFR: 92 mL/min/{1.73_m2} (ref >59)

## 2022-04-20 LAB — HEMOGLOBIN A1C
Est. average glucose Bld gHb Est-mCnc: 126 mg/dL
Hgb A1c MFr Bld: 6 % — ABNORMAL HIGH (ref 4.8–5.6)

## 2022-05-11 ENCOUNTER — Other Ambulatory Visit: Payer: Self-pay

## 2022-06-13 ENCOUNTER — Other Ambulatory Visit: Payer: Self-pay

## 2022-06-14 ENCOUNTER — Other Ambulatory Visit: Payer: Self-pay

## 2022-06-15 ENCOUNTER — Other Ambulatory Visit: Payer: Self-pay

## 2022-07-13 ENCOUNTER — Other Ambulatory Visit: Payer: Self-pay

## 2022-08-24 ENCOUNTER — Other Ambulatory Visit: Payer: Self-pay

## 2022-08-25 ENCOUNTER — Other Ambulatory Visit: Payer: Self-pay

## 2022-09-04 ENCOUNTER — Other Ambulatory Visit (HOSPITAL_COMMUNITY): Payer: Self-pay

## 2022-09-20 ENCOUNTER — Emergency Department (HOSPITAL_BASED_OUTPATIENT_CLINIC_OR_DEPARTMENT_OTHER)
Admission: EM | Admit: 2022-09-20 | Discharge: 2022-09-20 | Disposition: A | Discharge status: Home / Self Care | Payer: Self-pay | Attending: Emergency Medicine | Admitting: Emergency Medicine

## 2022-09-20 ENCOUNTER — Other Ambulatory Visit: Payer: Self-pay

## 2022-09-20 ENCOUNTER — Encounter (HOSPITAL_BASED_OUTPATIENT_CLINIC_OR_DEPARTMENT_OTHER): Payer: Self-pay

## 2022-09-20 DIAGNOSIS — R059 Cough, unspecified: Secondary | ICD-10-CM | POA: Insufficient documentation

## 2022-09-20 DIAGNOSIS — N189 Chronic kidney disease, unspecified: Secondary | ICD-10-CM | POA: Insufficient documentation

## 2022-09-20 DIAGNOSIS — J029 Acute pharyngitis, unspecified: Secondary | ICD-10-CM | POA: Insufficient documentation

## 2022-09-20 DIAGNOSIS — R519 Headache, unspecified: Secondary | ICD-10-CM | POA: Insufficient documentation

## 2022-09-20 DIAGNOSIS — Z1152 Encounter for screening for COVID-19: Secondary | ICD-10-CM | POA: Insufficient documentation

## 2022-09-20 DIAGNOSIS — I509 Heart failure, unspecified: Secondary | ICD-10-CM | POA: Insufficient documentation

## 2022-09-20 DIAGNOSIS — J45909 Unspecified asthma, uncomplicated: Secondary | ICD-10-CM | POA: Insufficient documentation

## 2022-09-20 LAB — GROUP A STREP BY PCR: Group A Strep by PCR: NOT DETECTED

## 2022-09-20 LAB — RESP PANEL BY RT-PCR (RSV, FLU A&B, COVID)  RVPGX2
Influenza A by PCR: NEGATIVE
Influenza B by PCR: NEGATIVE
Resp Syncytial Virus by PCR: NEGATIVE
SARS Coronavirus 2 by RT PCR: NEGATIVE

## 2022-09-20 MED ORDER — IPRATROPIUM-ALBUTEROL 0.5-2.5 (3) MG/3ML IN SOLN
3.0000 mL | Freq: Once | RESPIRATORY_TRACT | Status: AC
  Administered 2022-09-20: 3 mL via RESPIRATORY_TRACT
  Filled 2022-09-20: qty 3

## 2022-09-20 MED ORDER — IBUPROFEN 400 MG PO TABS
600.0000 mg | ORAL_TABLET | Freq: Once | ORAL | Status: AC
  Administered 2022-09-20: 600 mg via ORAL
  Filled 2022-09-20: qty 1

## 2022-09-20 MED ORDER — LIDOCAINE VISCOUS HCL 2 % MT SOLN
15.0000 mL | Freq: Once | OROMUCOSAL | Status: AC
  Administered 2022-09-20: 15 mL via OROMUCOSAL
  Filled 2022-09-20: qty 15

## 2022-09-20 MED ORDER — GUAIFENESIN-DM 100-10 MG/5ML PO SYRP
5.0000 mL | ORAL_SOLUTION | ORAL | 0 refills | Status: DC | PRN
  Filled 2022-09-20: qty 118, 4d supply, fill #0

## 2022-09-20 NOTE — ED Triage Notes (Signed)
Spanish interpreter needed. Sore throat since last Thursday. Cough and HA as well. Difficulty swallowing

## 2022-09-20 NOTE — ED Notes (Signed)
Patient has a weak, dry cough. Patient looks to be "pushing hard" when coughing and breathing in attempt to get her breath out. Lung fields are tight and diminished. RT notified.

## 2022-09-20 NOTE — ED Notes (Signed)
Discharge paperwork reviewed entirely with patient, including Rx's and follow up care. Pain was under control. Pt verbalized understanding as well as all parties involved. No questions or concerns voiced at the time of discharge. No acute distress noted.   Pt ambulated out to PVA without incident or assistance.  

## 2022-09-20 NOTE — Discharge Instructions (Signed)
Evaluation for your sore throat cough and headache were overall reassuring.  Symptoms are consistent with a likely upper respiratory infection.  Recommend they continue conservative treatment at home.  For your cough I have sent Robitussin to your pharmacy.  Recommend that you take it at night so that you get a good nights rest.  If your symptoms persist please follow-up with your PCP.

## 2022-09-20 NOTE — ED Provider Notes (Signed)
[facility] Provider Note   CSN: [id number] Arrival date & time: 09/20/22  1141     History  Chief Complaint  Patient presents with   Sore Throat   HPI Eileen [name] is a 52 y.o. female with asthma, CHF and CKD per day for sore throat.  Also endorsing associated headache and cough.  Symptoms started on Thursday.  States her children have had similar symptoms.  She has been taking Advil and cough syrup.  Cough is productive with clear sputum.  Endorses subjective fever.  Denies shortness of breath or chest pain.  States the pain in her throat is making it difficult to swallow but still tolerating fluid intake.    Sore Throat Associated symptoms include headaches.       Home Medications Prior to Admission medications   Medication Sig Start Date End Date Taking? Authorizing Provider  guaiFENesin-dextromethorphan (ROBITUSSIN DM) 100-10 MG/5ML syrup Take 5 mLs by mouth every 4 (four) hours as needed for cough. 09/20/22  Yes Harriet Pho, PA-C  acetaminophen (TYLENOL) 500 MG tablet Take 500 mg by mouth every 6 (six) hours as needed for mild pain, headache or fever.    [provider]  albuterol (PROAIR HFA) 108 (90 Base) MCG/ACT inhaler INHALE 2 PUFFS INTO THE LUNGS EVERY 6 (SIX) HOURS AS NEEDED FOR WHEEZING OR SHORTNESS OF BREATH. 02/09/22   McClung, Dionne Bucy, PA-C  busPIRone (BUSPAR) 10 MG tablet Take 1 tablet (10 mg total) by mouth 2 (two) times daily. 02/09/22   Argentina Donovan, PA-C  cetirizine (ZYRTEC) 10 MG tablet Take 10 mg by mouth daily as needed for allergies.    [provider]  FLUoxetine (PROZAC) 20 MG capsule Take 1 capsule (20 mg total) by mouth daily. 02/09/22   Argentina Donovan, PA-C  gabapentin (NEURONTIN) 300 MG capsule Take 1 capsule (300 mg total) by mouth at bedtime. 02/09/22   Argentina Donovan, PA-C  hydrOXYzine (ATARAX) 25 MG tablet Take 1 tablet (25 mg total) by mouth 2 (two) times daily as  needed. 02/09/22   Argentina Donovan, PA-C  meloxicam (MOBIC) 15 MG tablet Take 1 tablet (15 mg total) by mouth daily. 02/09/22   Argentina Donovan, PA-C  mometasone-formoterol (DULERA) 100-5 MCG/ACT AERO Inhale 2 puffs into the lungs in the morning and at bedtime. 02/09/22   Argentina Donovan, PA-C  montelukast (SINGULAIR) 10 MG tablet Take 1 tablet (10 mg total) by mouth at bedtime. 02/09/22   Argentina Donovan, PA-C      Allergies    Patient has no known allergies.    Review of Systems   Review of Systems  HENT:  Positive for sore throat.   Respiratory:  Positive for cough.   Neurological:  Positive for headaches.    Physical Exam   Vitals:   09/20/22 1159  BP: 133/78  Pulse: 79  Resp: 16  Temp: 98.1 F (36.7 C)  SpO2: 96%    CONSTITUTIONAL:  well-appearing, NAD NEURO:  Alert and oriented x 3, CN 3-12 grossly intact EYES:  eyes equal and reactive ENT/NECK:  Supple, no stridor, posterior pharynx without exudate, swelling or erythema.  No cervical lymphadenopathy.  No evidence of peritonsillar abscess.  Neck is nontender with normal ROM CARDIO:  regular rate and  rhythm, appears well-perfused  PULM:  No respiratory distress, CTAB MSK/SPINE:  No gross deformities, no edema, moves all extremities  SKIN:  no rash, atraumatic   *Additional and/or  pertinent findings included in MDM below    ED Results / Procedures / Treatments   Labs (all labs ordered are listed, but only abnormal results are displayed) Labs Reviewed  RESP PANEL BY RT-PCR (RSV, FLU A&B, COVID)  [id number]  GROUP A STREP BY PCR    EKG None  Radiology No results found.  Procedures Procedures    Medications Ordered in ED Medications  ipratropium-albuterol (DUONEB) 0.5-2.5 (3) MG/3ML nebulizer solution 3 mL (3 mLs Nebulization Given 09/20/22 1505)  ipratropium-albuterol (DUONEB) 0.5-2.5 (3) MG/3ML nebulizer solution 3 mL (3 mLs Nebulization Given 09/20/22 1500)  ibuprofen (ADVIL) tablet 600 mg (600 mg  Oral Given 09/20/22 1635)  lidocaine (XYLOCAINE) 2 % viscous mouth solution 15 mL (15 mLs Mouth/Throat Given 09/20/22 1635)    ED Course/ Medical Decision Making/ A&P                             Medical Decision Making Risk OTC drugs. Prescription drug management.   52 year old female who is well-appearing and hemodynamically stable presenting for sore throat, cough and headache.  Physical exam is overall reassuring.  Differential diagnosis for this complaint includes strep throat, COVID, flu, RSV, peritonsillar abscess.  Considered strep throat but unlikely given negative strep test and no concerning clinical findings suggestive of this diagnosis.  Respiratory panel also negative for COVID flu and RSV.  Doubt peritonsillar abscess given no clinical findings suggestive of this diagnosis.  Treated headache with ibuprofen.  Before my encounter patient endorsed shortness of breath and was given DuoNeb.  During my encounter she denies shortness of breath.  Lungs sounded clear to auscultation bilaterally.  Treated throat pain with viscous lidocaine.  Upon reevaluation patient stated that her symptoms have improved.  Sent Robitussin to her pharmacy for cough to be taken at night.  Advised her to follow-up with PCP if her symptoms persisted.  Discussed return precautions.        Final Clinical Impression(s) / ED Diagnoses Final diagnoses:  Sore throat  Cough, unspecified type  Nonintractable headache, unspecified chronicity pattern, unspecified headache type    Rx / DC Orders ED Discharge Orders          Ordered    guaiFENesin-dextromethorphan (ROBITUSSIN DM) 100-10 MG/5ML syrup  Every 4 hours PRN        09/20/22 1623              Harriet Pho, PA-C 09/20/22 [place], [place], DO 09/20/22 1912

## 2022-09-21 ENCOUNTER — Other Ambulatory Visit: Payer: Self-pay

## 2022-10-12 ENCOUNTER — Ambulatory Visit: Payer: Self-pay

## 2022-10-12 ENCOUNTER — Encounter: Payer: Self-pay | Admitting: Family Medicine

## 2022-10-12 ENCOUNTER — Ambulatory Visit: Payer: Self-pay | Attending: Family Medicine | Admitting: Family Medicine

## 2022-10-12 ENCOUNTER — Other Ambulatory Visit: Payer: Self-pay

## 2022-10-12 VITALS — BP 114/72 | HR 74 | Temp 98.3°F | Ht 61.0 in | Wt 199.2 lb

## 2022-10-12 DIAGNOSIS — J018 Other acute sinusitis: Secondary | ICD-10-CM

## 2022-10-12 DIAGNOSIS — F32A Depression, unspecified: Secondary | ICD-10-CM

## 2022-10-12 DIAGNOSIS — Z1231 Encounter for screening mammogram for malignant neoplasm of breast: Secondary | ICD-10-CM

## 2022-10-12 DIAGNOSIS — F419 Anxiety disorder, unspecified: Secondary | ICD-10-CM

## 2022-10-12 DIAGNOSIS — J4520 Mild intermittent asthma, uncomplicated: Secondary | ICD-10-CM

## 2022-10-12 MED ORDER — AMOXICILLIN-POT CLAVULANATE 875-125 MG PO TABS
1.0000 | ORAL_TABLET | Freq: Two times a day (BID) | ORAL | 0 refills | Status: DC
  Filled 2022-10-12: qty 20, 10d supply, fill #0

## 2022-10-12 MED ORDER — PREDNISONE 20 MG PO TABS
20.0000 mg | ORAL_TABLET | Freq: Every day | ORAL | 0 refills | Status: DC
  Filled 2022-10-12: qty 5, 5d supply, fill #0

## 2022-10-12 MED ORDER — BENZONATATE 100 MG PO CAPS
100.0000 mg | ORAL_CAPSULE | Freq: Two times a day (BID) | ORAL | 0 refills | Status: DC | PRN
  Filled 2022-10-12: qty 20, 10d supply, fill #0

## 2022-10-12 NOTE — Progress Notes (Signed)
Cough, headaches and sore throat.

## 2022-10-12 NOTE — Patient Instructions (Signed)
Infeccin de los senos paranasales en los adultos Sinus Infection, Adult La infeccin de los senos paranasales es el dolor y la hinchazn (inflamacin) de los senos paranasales. Los senos paranasales son espacios vacos en los huesos alrededor del rostro. Estos se encuentran en los siguientes lugares: Alrededor de los ojos. En la mitad de la frente. Detrs de Mudlogger. En los pmulos. Los senos paranasales y las fosas nasales estn cubiertos de un lquido llamado mucosidad. La mucosidad drena a travs de los senos paranasales. La hinchazn puede atrapar mucosidad en los senos paranasales. Esto permite que se desarrollen grmenes (bacterias, virus u hongos), lo que produce infecciones. La [employer], la causa de esta afeccin es un virus. Cules son las causas? Alergias. Asma. Grmenes. Objetos que obstruyen la nariz o los senos paranasales. Crecimientos en el interior de la nariz (plipos nasales). Sustancias qumicas o irritantes que estn presentes en el aire. Un hongo. Esto es poco frecuente. Qu incrementa el riesgo? Tener debilitado el sistema de defensa del cuerpo (sistema inmunitario). Nadar o bucear mucho. Usar aerosoles nasales con mucha frecuencia. Fumar. Cules son los signos o sntomas? Los principales sntomas de esta afeccin son dolor y sensacin de presin alrededor de los senos paranasales. Otros sntomas incluyen: Nariz tapada (congestin nasal). Esto puede dificultar la respiracin por la nariz. Goteo nasal (drenaje). Dolor, hinchazn y Freight forwarder en los senos paranasales. Tos que puede empeorar por la noche. Incapacidad de sentir olores y sabores. Mucosidad que se acumula en la garganta o la parte posterior de la nariz (goteo posnasal). Esto puede causar dolor de garganta o mal aliento. Estar muy cansado (fatigado). Eileen [name]. Cmo se diagnostica? Los sntomas. Sus antecedentes mdicos. Un examen fsico. Pruebas para averiguar si la afeccin es de corta  duracin [place]) o de larga duracin (crnica). El mdico puede: Revisarle la nariz para detectar crecimientos (plipos). Revisarle los senos paranasales con una herramienta que tiene una luz en un extremo (endoscopio). Revisar si tiene alergias o grmenes. Hacerle pruebas de diagnstico por imgenes, como una resonancia magntica (RM) o exploracin por tomografa computarizada (TC). Cmo se trata? El tratamiento de esta afeccin depende de la causa y si es de corta o de larga duracin. Si la causa es un virus, los sntomas Special educational needs teacher solos en el trmino de 10 das. Pueden darle medicamentos para aliviar los sntomas. Incluyen lo siguiente: Medicamentos para Medical illustrator tejido inflamado de la Lawyer. Un aerosol para tratar la hinchazn de las fosas nasales. Enjuagues que ayudan a eliminar la mucosidad espesa de la nariz (lavados con solucin salina nasal). Medicamentos para tratar alergias (antihistamnicos). Analgsicos de [employer]. Si la causa es una bacteria, es posible que el mdico espere para averiguar si usted mejora sin Clinical research associate. Es posible que le den un antibitico si usted tiene: Una infeccin grave. El sistema de defensa del organismo debilitado. Si la causa son crecimientos en la [place], es posible que necesite [place]. Siga estas instrucciones en su casa: [employer], use o aplique los medicamentos de venta libre y los recetados solamente como se lo haya indicado el mdico. Estos pueden incluir aerosoles nasales. Si le recetaron un antibitico, tmelo como se lo haya indicado el mdico. No deje de tomarlo aunque comience a sentirse mejor. Hidrtese y humidifique los ambientes  Beba suficiente agua para Theatre manager el pis (la orina) de color amarillo plido. Use un humidificador de vapor fro para mantener la humedad de su hogar por encima del 50 %. Inhale vapor durante 10 a 15 minutos,  de 3 a 4 veces al da, o como se lo haya indicado el mdico. Puede hacer  esto en el bao con el vapor del agua caliente de la ducha. Trate de no exponerse al aire fro o seco. Reposo Descanse todo lo posible. Duerma con la cabeza levantada (elevada). Asegrese de dormir lo suficiente cada noche. Instrucciones generales  Pngase un pao caliente y hmedo en el rostro 3 a 4 veces al da, o con la frecuencia indicada por el mdico. Hgase lavados con solucin salina nasal con la frecuencia que le haya indicado el mdico. Lvese las manos frecuentemente con agua y [place]. Use un desinfectante para manos si no dispone de [place]. No fume. Evite estar cerca de personas que fuman (fumador pasivo). Concurra a [place]. Comunquese con un mdico si: Tiene fiebre. Sus sntomas empeoran. Los sntomas no mejoran en el perodo de 10 das. Solicite ayuda de inmediato si: Tiene un dolor de cabeza muy intenso. No puede dejar de vomitar. Tiene dolor muy intenso o hinchazn en la zona del rostro o los ojos. Tiene dificultad para ver. Se siente confundido. Tiene el cuello rgido. Tiene dificultad para respirar. Estos sntomas pueden Sales executive. Solicite ayuda de inmediato. Llame al 911. No espere a ver si los sntomas desaparecen. No conduzca por sus propios medios [employer]. Resumen La infeccin de los senos paranasales es la hinchazn de los senos paranasales. Los senos paranasales son espacios vacos en los huesos alrededor del rostro. La causa de esta afeccin es la inflamacin o hinchazn de los tejidos que estn en el interior de la [place]. Esto hace que los grmenes queden atrapados. Los grmenes pueden provocar infecciones. Si le recetaron un antibitico, tmelo como se lo haya indicado el mdico. No deje de tomarlo aunque comience a sentirse mejor. Concurra a [place]. Esta informacin no tiene Marine scientist el consejo del mdico. Asegrese de hacerle al mdico cualquier pregunta que  tenga. Document Revised: 08/03/2021 Document Reviewed: 08/03/2021 Elsevier Patient Education  [place].

## 2022-10-12 NOTE — Progress Notes (Signed)
Subjective:  Patient ID: Eileen [name], female    DOB: January 28, 1971  Age: 52 y.o. MRN: [id number]  CC: Cough   HPI Eileen [name] is a 52 y.o. year old female with a history of asthma, depression, previous tobacco abuse (quit in 11/2020).    Interval History:  She Complains of cough, sore throat, nasal congestion for the last few days with associated subjective fever, myalgia. .Cough prevents her from sleeping and is productive of whitish phlegm and she sometimes vomits. She has had headaches but no facial pain.She notices wheezing when she lies down. 3 weeks ago she had an ED visit for same and was prescribed an MDI and an antitussive. Symptoms improved after that visit then returned a few days ago.  She no longer takes her SSRI as she states she has 'gotten rid of her Depression'. Past Medical History:  Diagnosis Date   Allergy    Pollen    Arthritis    Asthma    CHF (congestive heart failure) ([place])    "unsure thinks lungs had fluid around after surgery 7 years ago"   Chronic kidney disease    kidney infection   Neuromuscular disorder ([place])    HERNIA   Right inguinal hernia    Shortness of breath     Past Surgical History:  Procedure Laterality Date   CESAREAN SECTION     x2   CESAREAN SECTION     COLONOSCOPY     HERNIA REPAIR  09/12/11   RIH   HERNIA REPAIR     INGUINAL HERNIA REPAIR  09/12/2011   Procedure: HERNIA REPAIR INGUINAL ADULT;  Surgeon: Gayland Curry, MD;  Location: [place];  Service: General;  Laterality: Right;  open right inguinal hernia with mesh    POLYPECTOMY     TUBAL LIGATION      Family History  Problem Relation Age of Onset   Ulcers Father        stomach   Diabetes Brother    Esophageal cancer Nephew    Diabetes Brother    Colon cancer Neg Hx    Rectal cancer Neg Hx    Stomach cancer Neg Hx    Breast cancer Neg Hx    Colon polyps Neg Hx     Social History   Socioeconomic History   Marital status: Legally Separated     Spouse name: Not on file   Number of children: 4   Years of education: Not on file   Highest education level: 6th grade  Occupational History   Occupation: Cleaning  Tobacco Use   Smoking status: Former    Packs/day: 0.50    Years: 20.00    Total pack years: 10.00    Types: Cigarettes   Smokeless tobacco: Never   Tobacco comments:    recently quit  Vaping Use   Vaping Use: Never used  Substance and Sexual Activity   Alcohol use: No   Drug use: No   Sexual activity: Yes    Birth control/protection: Surgical  Other Topics Concern   Not on file  Social History Narrative   ** Merged History Encounter **       Social Determinants of Health   Financial Resource Strain: Not on file  Food Insecurity: Not on file  Transportation Needs: No Transportation Needs (05/06/2020)   PRAPARE - Hydrologist (Medical): No    Lack of Transportation (Non-Medical): No  Physical Activity: Not on file  Stress: Not on  file  Social Connections: Not on file    No Known Allergies  Outpatient Medications Prior to Visit  Medication Sig Dispense Refill   acetaminophen (TYLENOL) 500 MG tablet Take 500 mg by mouth every 6 (six) hours as needed for mild pain, headache or fever.     albuterol (PROAIR HFA) 108 (90 Base) MCG/ACT inhaler INHALE 2 PUFFS INTO THE LUNGS EVERY 6 (SIX) HOURS AS NEEDED FOR WHEEZING OR SHORTNESS OF BREATH. 8.5 g 3   cetirizine (ZYRTEC) 10 MG tablet Take 10 mg by mouth daily as needed for allergies.     gabapentin (NEURONTIN) 300 MG capsule Take 1 capsule (300 mg total) by mouth at bedtime. 30 capsule 3   meloxicam (MOBIC) 15 MG tablet Take 1 tablet (15 mg total) by mouth daily. 30 tablet 3   mometasone-formoterol (DULERA) 100-5 MCG/ACT AERO Inhale 2 puffs into the lungs in the morning and at bedtime. 13 g 3   montelukast (SINGULAIR) 10 MG tablet Take 1 tablet (10 mg total) by mouth at bedtime. 30 tablet 3   busPIRone (BUSPAR) 10 MG tablet Take 1 tablet  (10 mg total) by mouth 2 (two) times daily. 60 tablet 3   FLUoxetine (PROZAC) 20 MG capsule Take 1 capsule (20 mg total) by mouth daily. 30 capsule 6   hydrOXYzine (ATARAX) 25 MG tablet Take 1 tablet (25 mg total) by mouth 2 (two) times daily as needed. 60 tablet 1   guaiFENesin-dextromethorphan (ROBITUSSIN DM) 100-10 MG/5ML syrup Take 5 mLs by mouth every 4 (four) hours as needed for cough. (Patient not taking: Reported on 10/12/2022) 118 mL 0   No facility-administered medications prior to visit.     ROS Review of Systems  Constitutional:  Negative for activity change and appetite change.  HENT:  Positive for congestion and sore throat. Negative for sinus pressure.   Respiratory:  Positive for cough. Negative for chest tightness, shortness of breath and wheezing.   Cardiovascular:  Negative for chest pain and palpitations.  Gastrointestinal:  Negative for abdominal distention, abdominal pain and constipation.  Genitourinary: Negative.   Musculoskeletal: Negative.   Neurological:  Positive for headaches.  Psychiatric/Behavioral:  Negative for behavioral problems and dysphoric mood.     Objective:  BP 114/72   Pulse 74   Temp 98.3 F (36.8 C) (Oral)   Ht 5' 1"$  (1.549 m)   Wt 199 lb 3.2 oz (90.4 kg)   SpO2 97%   BMI 37.64 kg/m      10/12/2022    1:59 PM 09/20/2022   11:59 AM 04/19/2022    2:46 PM  BP/Weight  Systolic BP 99991111 [id number] 99991111  Diastolic BP 72 78 74  Wt. (Lbs) 199.2  197  BMI 37.64 kg/m2  37.22 kg/m2      Physical Exam Constitutional:      Appearance: She is well-developed.  HENT:     Right Ear: Tympanic membrane normal.     Left Ear: Tympanic membrane normal.     Mouth/Throat:     Mouth: Mucous membranes are moist.     Pharynx: Posterior oropharyngeal erythema present.  Eyes:     Comments: Infraorbital edema   Cardiovascular:     Rate and Rhythm: Normal rate.     Heart sounds: Normal heart sounds. No murmur heard. Pulmonary:     Effort: Pulmonary effort  is normal.     Breath sounds: Normal breath sounds. No wheezing or rales.  Chest:     Chest wall: No tenderness.  Abdominal:  General: Bowel sounds are normal. There is no distension.     Palpations: Abdomen is soft. There is no mass.     Tenderness: There is no abdominal tenderness.  Musculoskeletal:        General: Normal range of motion.     Right lower leg: No edema.     Left lower leg: No edema.  Neurological:     Mental Status: She is alert and oriented to person, place, and time.  Psychiatric:        Mood and Affect: Mood normal.        Latest Ref Rng & Units 04/19/2022    3:16 PM 12/08/2020   10:10 PM 08/04/2020    7:39 PM  CMP  Glucose 70 - 99 mg/dL 138  119  80   BUN 6 - 24 mg/dL 19  14  22   $ Creatinine 0.57 - 1.00 mg/dL 0.79  0.95  0.89   Sodium 134 - 144 mmol/L 140  138  139   Potassium 3.5 - 5.2 mmol/L 4.3  3.7  3.7   Chloride 96 - 106 mmol/L 103  107  107   CO2 20 - 29 mmol/L 20  25  25   $ Calcium 8.7 - 10.2 mg/dL 9.8  9.2  8.8   Total Protein 6.0 - 8.5 g/dL 6.6     Total Bilirubin 0.0 - 1.2 mg/dL 0.3     Alkaline Phos 44 - 121 IU/L 116     AST 0 - 40 IU/L 20     ALT 0 - 32 IU/L 33       Lipid Panel     Component Value Date/Time   CHOL 170 11/18/2015 0942   TRIG 85 11/18/2015 0942   HDL 48 11/18/2015 0942   CHOLHDL 3.5 11/18/2015 0942   CHOLHDL 4.0 Ratio 06/28/2010 2048   VLDL 26 06/28/2010 2048   LDLCALC 105 (H) 11/18/2015 0942    CBC    Component Value Date/Time   WBC 9.1 12/08/2020 2210   RBC 4.08 12/08/2020 2210   HGB 12.8 12/08/2020 2210   HGB 13.7 09/06/2018 1230   HCT 38.0 12/08/2020 2210   HCT 40.2 09/06/2018 1230   PLT 270 12/08/2020 2210   PLT 304 09/06/2018 1230   MCV 93.1 12/08/2020 2210   MCV 92 09/06/2018 1230   MCH 31.4 12/08/2020 2210   MCHC 33.7 12/08/2020 2210   RDW 13.3 12/08/2020 2210   RDW 13.1 09/06/2018 1230   LYMPHSABS 2.7 08/04/2020 1939   MONOABS 1.2 (H) 08/04/2020 1939   EOSABS 0.1 08/04/2020 1939    BASOSABS 0.1 08/04/2020 1939    Lab Results  Component Value Date   HGBA1C 6.0 (H) 04/19/2022    Assessment & Plan:  1. Anxiety and depression Controlled She states she no longer needs an SSRI hence I have discontinued this  2. Mild intermittent asthma without complication Superimposed sinusitis which might be causing a flare Placed on prednisone Continue inhalers - Basic Metabolic Panel  3. Acute non-recurrent sinusitis of other sinus Could explain her symptoms - predniSONE (DELTASONE) 20 MG tablet; Take 1 tablet (20 mg total) by mouth daily with breakfast.  Dispense: 5 tablet; Refill: 0 - amoxicillin-clavulanate (AUGMENTIN) 875-125 MG tablet; Take 1 tablet by mouth 2 (two) times daily.  Dispense: 20 tablet; Refill: 0 - benzonatate (TESSALON) 100 MG capsule; Take 1 capsule (100 mg total) by mouth 2 (two) times daily as needed for cough.  Dispense: 20 capsule; Refill: 0  4. Encounter for screening mammogram for malignant neoplasm of breast - MM 3D SCREEN BREAST BILATERAL; Future  Meds ordered this encounter  Medications   predniSONE (DELTASONE) 20 MG tablet    Sig: Take 1 tablet (20 mg total) by mouth daily with breakfast.    Dispense:  5 tablet    Refill:  0   amoxicillin-clavulanate (AUGMENTIN) 875-125 MG tablet    Sig: Take 1 tablet by mouth 2 (two) times daily.    Dispense:  20 tablet    Refill:  0   benzonatate (TESSALON) 100 MG capsule    Sig: Take 1 capsule (100 mg total) by mouth 2 (two) times daily as needed for cough.    Dispense:  20 capsule    Refill:  0    Follow-up: Return in about 1 month (around 11/10/2022) for Pap smear.       Charlott Rakes, MD, FAAFP. [facility] and [place], [place]   10/12/2022, 2:54 PM

## 2022-10-12 NOTE — Telephone Encounter (Signed)
  Chief Complaint: Coughing - difficulty breathing when coughing. Sore throat. Symptoms: Above Frequency:  Pertinent Negatives: Patient denies  Disposition: '[]'$ ED /'[]'$ Urgent Care (no appt availability in office) / '[]'$ Appointment(In office/virtual)/ '[]'$  [facility]/ '[]'$ Home Care/ '[]'$ Refused Recommended Disposition /'[]'$ [place] [facility]/ '[]'$  Follow-up with PCP Additional Notes: PT was transferred to NT because she wanted to cancel appt for this afternoon. After call was transferred pt stated that she is on her way to appt. .   Triage was suspended.   Reason for Disposition . [1] MILD difficulty breathing (e.g., minimal/no SOB at rest, SOB with walking, pulse <100) AND [2] NEW-onset or WORSE than normal  Protocols used: Breathing Difficulty-A-AH

## 2022-10-13 LAB — BASIC METABOLIC PANEL
BUN/Creatinine Ratio: 25 — ABNORMAL HIGH (ref 9–23)
BUN: 14 mg/dL (ref 6–24)
CO2: 19 mmol/L — ABNORMAL LOW (ref 20–29)
Calcium: 9.4 mg/dL (ref 8.7–10.2)
Chloride: 105 mmol/L (ref 96–106)
Creatinine, Ser: 0.57 mg/dL (ref 0.57–1.00)
Glucose: 103 mg/dL — ABNORMAL HIGH (ref 70–99)
Potassium: 4.4 mmol/L (ref 3.5–5.2)
Sodium: 142 mmol/L (ref 134–144)
eGFR: 111 mL/min/{1.73_m2} (ref >59)

## 2022-10-23 ENCOUNTER — Ambulatory Visit: Payer: Self-pay | Admitting: Family Medicine

## 2022-11-15 ENCOUNTER — Ambulatory Visit: Payer: Self-pay | Attending: Family Medicine | Admitting: Family Medicine

## 2022-11-15 ENCOUNTER — Other Ambulatory Visit: Payer: Self-pay

## 2022-11-15 ENCOUNTER — Other Ambulatory Visit (HOSPITAL_COMMUNITY)
Admission: RE | Admit: 2022-11-15 | Discharge: 2022-11-15 | Disposition: A | Discharge status: Home / Self Care | Payer: Self-pay | Source: Ambulatory Visit | Attending: Family Medicine | Admitting: Family Medicine

## 2022-11-15 ENCOUNTER — Encounter: Payer: Self-pay | Admitting: Family Medicine

## 2022-11-15 VITALS — BP 110/73 | HR 88 | Temp 98.3°F | Ht 61.0 in | Wt 193.0 lb

## 2022-11-15 DIAGNOSIS — M722 Plantar fascial fibromatosis: Secondary | ICD-10-CM

## 2022-11-15 DIAGNOSIS — Z113 Encounter for screening for infections with a predominantly sexual mode of transmission: Secondary | ICD-10-CM

## 2022-11-15 DIAGNOSIS — Z124 Encounter for screening for malignant neoplasm of cervix: Secondary | ICD-10-CM

## 2022-11-15 DIAGNOSIS — R109 Unspecified abdominal pain: Secondary | ICD-10-CM

## 2022-11-15 LAB — POCT URINALYSIS DIP (CLINITEK)
Bilirubin, UA: NEGATIVE
Blood, UA: NEGATIVE
Glucose, UA: NEGATIVE mg/dL
Ketones, POC UA: NEGATIVE mg/dL
Leukocytes, UA: NEGATIVE
Nitrite, UA: NEGATIVE
POC PROTEIN,UA: NEGATIVE
Spec Grav, UA: 1.025 (ref 1.010–1.025)
Urobilinogen, UA: 0.2 E.U./dL
pH, UA: 5.5 (ref 5.0–8.0)

## 2022-11-15 MED ORDER — MELOXICAM 15 MG PO TABS
15.0000 mg | ORAL_TABLET | Freq: Every day | ORAL | 1 refills | Status: DC
  Filled 2022-11-15: qty 30, 30d supply, fill #0
  Filled 2022-12-26 (×2): qty 30, 30d supply, fill #1

## 2022-11-15 NOTE — Progress Notes (Signed)
Lower back pain 

## 2022-11-15 NOTE — Progress Notes (Signed)
Subjective:  Patient ID: Eileen [name], female    DOB: November 24, 1970  Age: 52 y.o. MRN: [id number]  CC: Gynecologic Exam   HPI Sushma Sahadeo is a 52 y.o. year old female with a history of asthma here for a gynecological exam.  Interval History:  She Complains of low back pain x1 weeks and denies heavy lifting. Pain radiates to her right side of her abdomen but she has no urinary symptoms , no nausea, vomiting pain. Pain is worse with prolonged sitting. She is just getting over a Gastroenteritis episode which her whole family had Also has pain in both heels. She is due for Pap smear. Past Medical History:  Diagnosis Date   Allergy    Pollen    Arthritis    Asthma    CHF (congestive heart failure) ([place])    "unsure thinks lungs had fluid around after surgery 7 years ago"   Chronic kidney disease    kidney infection   Neuromuscular disorder ([place])    HERNIA   Right inguinal hernia    Shortness of breath     Past Surgical History:  Procedure Laterality Date   CESAREAN SECTION     x2   CESAREAN SECTION     COLONOSCOPY     HERNIA REPAIR  09/12/11   RIH   HERNIA REPAIR     INGUINAL HERNIA REPAIR  09/12/2011   Procedure: HERNIA REPAIR INGUINAL ADULT;  Surgeon: Gayland Curry, MD;  Location: [place];  Service: General;  Laterality: Right;  open right inguinal hernia with mesh    POLYPECTOMY     TUBAL LIGATION      Family History  Problem Relation Age of Onset   Ulcers Father        stomach   Diabetes Brother    Esophageal cancer Nephew    Diabetes Brother    Colon cancer Neg Hx    Rectal cancer Neg Hx    Stomach cancer Neg Hx    Breast cancer Neg Hx    Colon polyps Neg Hx     Social History   Socioeconomic History   Marital status: Legally Separated    Spouse name: Not on file   Number of children: 4   Years of education: Not on file   Highest education level: 6th grade  Occupational History   Occupation: Cleaning  Tobacco Use   Smoking status:  Former    Packs/day: 0.50    Years: 20.00    Additional pack years: 0.00    Total pack years: 10.00    Types: Cigarettes   Smokeless tobacco: Never   Tobacco comments:    recently quit  Vaping Use   Vaping Use: Never used  Substance and Sexual Activity   Alcohol use: No   Drug use: No   Sexual activity: Yes    Birth control/protection: Surgical  Other Topics Concern   Not on file  Social History Narrative   ** Merged History Encounter **       Social Determinants of Health   Financial Resource Strain: Not on file  Food Insecurity: Not on file  Transportation Needs: No Transportation Needs (05/06/2020)   PRAPARE - Hydrologist (Medical): No    Lack of Transportation (Non-Medical): No  Physical Activity: Not on file  Stress: Not on file  Social Connections: Not on file    No Known Allergies  Outpatient Medications Prior to Visit  Medication Sig Dispense Refill  acetaminophen (TYLENOL) 500 MG tablet Take 500 mg by mouth every 6 (six) hours as needed for mild pain, headache or fever. (Patient not taking: Reported on 11/15/2022)     albuterol (PROAIR HFA) 108 (90 Base) MCG/ACT inhaler INHALE 2 PUFFS INTO THE LUNGS EVERY 6 (SIX) HOURS AS NEEDED FOR WHEEZING OR SHORTNESS OF BREATH. (Patient not taking: Reported on 11/15/2022) 8.5 g 3   amoxicillin-clavulanate (AUGMENTIN) 875-125 MG tablet Take 1 tablet by mouth 2 (two) times daily. (Patient not taking: Reported on 11/15/2022) 20 tablet 0   benzonatate (TESSALON) 100 MG capsule Take 1 capsule (100 mg total) by mouth 2 (two) times daily as needed for cough. (Patient not taking: Reported on 11/15/2022) 20 capsule 0   cetirizine (ZYRTEC) 10 MG tablet Take 10 mg by mouth daily as needed for allergies. (Patient not taking: Reported on 11/15/2022)     gabapentin (NEURONTIN) 300 MG capsule Take 1 capsule (300 mg total) by mouth at bedtime. (Patient not taking: Reported on 11/15/2022) 30 capsule 3    guaiFENesin-dextromethorphan (ROBITUSSIN DM) 100-10 MG/5ML syrup Take 5 mLs by mouth every 4 (four) hours as needed for cough. (Patient not taking: Reported on 10/12/2022) 118 mL 0   mometasone-formoterol (DULERA) 100-5 MCG/ACT AERO Inhale 2 puffs into the lungs in the morning and at bedtime. (Patient not taking: Reported on 11/15/2022) 13 g 3   montelukast (SINGULAIR) 10 MG tablet Take 1 tablet (10 mg total) by mouth at bedtime. (Patient not taking: Reported on 11/15/2022) 30 tablet 3   predniSONE (DELTASONE) 20 MG tablet Take 1 tablet (20 mg total) by mouth daily with breakfast. (Patient not taking: Reported on 11/15/2022) 5 tablet 0   meloxicam (MOBIC) 15 MG tablet Take 1 tablet (15 mg total) by mouth daily. (Patient not taking: Reported on 11/15/2022) 30 tablet 3   No facility-administered medications prior to visit.     ROS Review of Systems  Constitutional:  Negative for activity change and appetite change.  HENT:  Negative for sinus pressure and sore throat.   Respiratory:  Negative for chest tightness, shortness of breath and wheezing.   Cardiovascular:  Negative for chest pain and palpitations.  Gastrointestinal:  Negative for abdominal distention, abdominal pain and constipation.  Genitourinary:  Positive for flank pain.  Psychiatric/Behavioral:  Negative for behavioral problems and dysphoric mood.     Objective:  BP 110/73   Pulse 88   Temp 98.3 F (36.8 C) (Oral)   Ht 5\' 1"  (1.549 m)   Wt 193 lb (87.5 kg)   SpO2 96%   BMI 36.47 kg/m      11/15/2022    2:51 PM 10/12/2022    1:59 PM 09/20/2022   11:59 AM  BP/Weight  Systolic BP [id number] [id number]  Diastolic BP 73 72 78  Wt. (Lbs) 193 199.2   BMI 36.47 kg/m2 37.64 kg/m2       Physical Exam Constitutional:      Appearance: She is well-developed.  Cardiovascular:     Rate and Rhythm: Normal rate.     Heart sounds: Normal heart sounds. No murmur heard. Pulmonary:     Effort: Pulmonary effort is normal.     Breath sounds:  Normal breath sounds. No wheezing or rales.  Chest:     Chest wall: No tenderness.  Abdominal:     General: Bowel sounds are normal. There is no distension.     Palpations: Abdomen is soft. There is no mass.     Tenderness: There is abdominal  tenderness (RUQ). There is right CVA tenderness.  Genitourinary:    Comments: External genitalia, vagina, cervix, adnexa-normal Musculoskeletal:        General: Normal range of motion.     Right lower leg: No edema.     Left lower leg: No edema.  Neurological:     Mental Status: She is alert and oriented to person, place, and time.  Psychiatric:        Mood and Affect: Mood normal.        Latest Ref Rng & Units 10/12/2022    2:29 PM 04/19/2022    3:16 PM 12/08/2020   10:10 PM  CMP  Glucose 70 - 99 mg/dL 103  138  119   BUN 6 - 24 mg/dL 14  19  14    Creatinine 0.57 - 1.00 mg/dL 0.57  0.79  0.95   Sodium 134 - 144 mmol/L 142  140  138   Potassium 3.5 - 5.2 mmol/L 4.4  4.3  3.7   Chloride 96 - 106 mmol/L 105  103  107   CO2 20 - 29 mmol/L 19  20  25    Calcium 8.7 - 10.2 mg/dL 9.4  9.8  9.2   Total Protein 6.0 - 8.5 g/dL  6.6    Total Bilirubin 0.0 - 1.2 mg/dL  0.3    Alkaline Phos 44 - 121 IU/L  116    AST 0 - 40 IU/L  20    ALT 0 - 32 IU/L  33      Lipid Panel     Component Value Date/Time   CHOL 170 11/18/2015 0942   TRIG 85 11/18/2015 0942   HDL 48 11/18/2015 0942   CHOLHDL 3.5 11/18/2015 0942   CHOLHDL 4.0 Ratio 06/28/2010 2048   VLDL 26 06/28/2010 2048   LDLCALC 105 (H) 11/18/2015 0942    CBC    Component Value Date/Time   WBC 9.1 12/08/2020 2210   RBC 4.08 12/08/2020 2210   HGB 12.8 12/08/2020 2210   HGB 13.7 09/06/2018 1230   HCT 38.0 12/08/2020 2210   HCT 40.2 09/06/2018 1230   PLT 270 12/08/2020 2210   PLT 304 09/06/2018 1230   MCV 93.1 12/08/2020 2210   MCV 92 09/06/2018 1230   MCH 31.4 12/08/2020 2210   MCHC 33.7 12/08/2020 2210   RDW 13.3 12/08/2020 2210   RDW 13.1 09/06/2018 1230   LYMPHSABS 2.7  08/04/2020 1939   MONOABS 1.2 (H) 08/04/2020 1939   EOSABS 0.1 08/04/2020 1939   BASOSABS 0.1 08/04/2020 1939    Lab Results  Component Value Date   HGBA1C 6.0 (H) 04/19/2022    Assessment & Plan:  1. Flank pain UA negative for UTI or hematuria Possibly muscle spasm - CMP14+EGFR - Acute Hep Panel & Hep B Surface Ab - POCT URINALYSIS DIP (CLINITEK)  2. Plantar fasciitis, bilateral Placed on NSAID  3. Screening for cervical cancer - Cytology - PAP  4. Screening for STD (sexually transmitted disease) - Cervicovaginal ancillary only  5. Plantar fasciitis Advised to use insoles - meloxicam (MOBIC) 15 MG tablet; Take 1 tablet (15 mg total) by mouth daily.  Dispense: 30 tablet; Refill: 1    Meds ordered this encounter  Medications   meloxicam (MOBIC) 15 MG tablet    Sig: Take 1 tablet (15 mg total) by mouth daily.    Dispense:  30 tablet    Refill:  1    Follow-up: Return in about 6 months (around 05/18/2023).  Charlott Rakes, MD, FAAFP. [facility] and [place], [place]   11/15/2022, 4:19 PM

## 2022-11-16 LAB — CERVICOVAGINAL ANCILLARY ONLY
Bacterial Vaginitis (gardnerella): NEGATIVE
Candida Glabrata: NEGATIVE
Candida Vaginitis: NEGATIVE
Chlamydia: NEGATIVE
Comment: NEGATIVE
Comment: NEGATIVE
Comment: NEGATIVE
Comment: NEGATIVE
Comment: NEGATIVE
Comment: NORMAL
Neisseria Gonorrhea: NEGATIVE
Trichomonas: NEGATIVE

## 2022-11-16 LAB — CMP14+EGFR
ALT: 32 IU/L (ref 0–32)
AST: 18 IU/L (ref 0–40)
Albumin/Globulin Ratio: 1.6 (ref 1.2–2.2)
Albumin: 4.1 g/dL (ref 3.9–4.9)
Alkaline Phosphatase: 110 IU/L (ref 44–121)
BUN/Creatinine Ratio: 18 (ref 9–23)
BUN: 12 mg/dL (ref 6–24)
Bilirubin Total: 0.3 mg/dL (ref 0.0–1.2)
CO2: 20 mmol/L (ref 20–29)
Calcium: 9.4 mg/dL (ref 8.7–10.2)
Chloride: 106 mmol/L (ref 96–106)
Creatinine, Ser: 0.68 mg/dL (ref 0.57–1.00)
Globulin, Total: 2.5 g/dL (ref 1.5–4.5)
Glucose: 125 mg/dL — ABNORMAL HIGH (ref 70–99)
Potassium: 4.1 mmol/L (ref 3.5–5.2)
Sodium: 140 mmol/L (ref 134–144)
Total Protein: 6.6 g/dL (ref 6.0–8.5)
eGFR: 106 mL/min/{1.73_m2} (ref >59)

## 2022-11-16 LAB — ACUTE HEP PANEL AND HEP B SURFACE AB
Hep A IgM: NEGATIVE
Hep B C IgM: NEGATIVE
Hep C Virus Ab: NONREACTIVE
Hepatitis B Surf Ab Quant: <3.1 m[IU]/mL — ABNORMAL LOW (ref >9.9)
Hepatitis B Surface Ag: NEGATIVE

## 2022-11-17 LAB — CYTOLOGY - PAP
Comment: NEGATIVE
Diagnosis: NEGATIVE
High risk HPV: NEGATIVE

## 2022-12-26 ENCOUNTER — Other Ambulatory Visit: Payer: Self-pay

## 2023-05-21 ENCOUNTER — Ambulatory Visit: Payer: Self-pay | Admitting: Family Medicine

## 2023-05-21 ENCOUNTER — Other Ambulatory Visit: Payer: Self-pay | Admitting: Family Medicine

## 2023-05-21 ENCOUNTER — Telehealth: Payer: Self-pay | Admitting: Family Medicine

## 2023-05-21 ENCOUNTER — Telehealth: Payer: Self-pay

## 2023-05-21 DIAGNOSIS — J4520 Mild intermittent asthma, uncomplicated: Secondary | ICD-10-CM

## 2023-05-21 NOTE — Telephone Encounter (Signed)
Medication Refill - Medication: albuterol (PROAIR HFA) 108 (90 Base) MCG/ACT inhaler   Has the patient contacted their pharmacy? No., Patient called to reschedule her 6 month follow up appointment scheduled today to 07/24/2023. Agent asked if there was any medications she needed to be refilled.     Preferred Pharmacy (with phone number or street name):  [facility] Phone: [phone]  Fax: [phone]       Has the patient been seen for an appointment in the last year OR does the patient have an upcoming appointment? Yes.    Agent: Please be advised that RX refills may take up to 3 business days. We ask that you follow-up with your pharmacy.

## 2023-05-21 NOTE — Telephone Encounter (Signed)
Patient was seeking to schedule her MM 3D SCREEN BREAST BILATERAL  at [facility]. She was deferred to contact [facility] at [phone] because she does not have insurance. Patient was transferred to [facility] and a message was left for an Agent to return her call with a spanish interpreter regarding assistance if she would qualify for financial assistance regarding her mamo.   Patient would like a follow up regarding renewing her orange card. Patient is unfamiliar with the [facility] and would like further information regarding the process and unsure if the orange card will cover.   When you return patient call please have a spanish interpreter on the line.

## 2023-05-21 NOTE — Telephone Encounter (Signed)
Patient qualified for mammogram scholarship program. Information being forwarded. Contacted Jenene Slicker, Patient Artist, [employer]. He has agreed to follow-up with patient regarding their [employer].

## 2023-05-21 NOTE — Telephone Encounter (Signed)
Telephoned patient at mobile number using interpreter#[phone]. Left a voice message with [facility] contact information.

## 2023-05-22 ENCOUNTER — Other Ambulatory Visit: Payer: Self-pay

## 2023-05-22 MED ORDER — ALBUTEROL SULFATE HFA 108 (90 BASE) MCG/ACT IN AERS
2.0000 | INHALATION_SPRAY | Freq: Four times a day (QID) | RESPIRATORY_TRACT | 2 refills | Status: DC | PRN
  Filled 2023-05-22: qty 6.7, 25d supply, fill #0

## 2023-05-22 NOTE — Telephone Encounter (Signed)
Requested Prescriptions  Pending Prescriptions Disp Refills   albuterol (PROAIR HFA) 108 (90 Base) MCG/ACT inhaler 8.5 g 2    Sig: INHALE 2 PUFFS INTO THE LUNGS EVERY 6 (SIX) HOURS AS NEEDED FOR WHEEZING OR SHORTNESS OF BREATH.     Pulmonology:  [facility] 2 Passed - 05/21/2023  9:39 AM      Passed - Last BP in normal range    BP Readings from Last 1 Encounters:  11/15/22 110/73         Passed - Last Heart Rate in normal range    Pulse Readings from Last 1 Encounters:  11/15/22 88         Passed - Valid encounter within last 12 months    Recent Outpatient Visits           6 months ago Flank pain   [place] [facility] [place], [place], [place]   7 months ago Mild intermittent asthma without complication   [place] [facility] Hoy Register, MD   1 year ago Bilateral carpal tunnel syndrome   [place] [facility] Hoy Register, MD   1 year ago Plantar fasciitis   [facility] [place], [place], [place]   1 year ago Bilateral carpal tunnel syndrome   [place] [facility] [place], Marzella Schlein, [place]       Future Appointments             In 2 months Hoy Register, MD [facility]

## 2023-05-31 ENCOUNTER — Other Ambulatory Visit: Payer: Self-pay

## 2023-07-24 ENCOUNTER — Ambulatory Visit: Payer: Self-pay | Attending: Family Medicine | Admitting: Family Medicine

## 2023-07-24 ENCOUNTER — Encounter: Payer: Self-pay | Admitting: Family Medicine

## 2023-07-24 ENCOUNTER — Other Ambulatory Visit: Payer: Self-pay

## 2023-07-24 VITALS — BP 115/73 | HR 84 | Ht 61.0 in | Wt 178.4 lb

## 2023-07-24 DIAGNOSIS — Z23 Encounter for immunization: Secondary | ICD-10-CM

## 2023-07-24 DIAGNOSIS — F3289 Other specified depressive episodes: Secondary | ICD-10-CM

## 2023-07-24 DIAGNOSIS — N951 Menopausal and female climacteric states: Secondary | ICD-10-CM

## 2023-07-24 DIAGNOSIS — M542 Cervicalgia: Secondary | ICD-10-CM

## 2023-07-24 DIAGNOSIS — J4520 Mild intermittent asthma, uncomplicated: Secondary | ICD-10-CM

## 2023-07-24 DIAGNOSIS — M722 Plantar fascial fibromatosis: Secondary | ICD-10-CM

## 2023-07-24 MED ORDER — CYCLOBENZAPRINE HCL 10 MG PO TABS
10.0000 mg | ORAL_TABLET | Freq: Every evening | ORAL | 0 refills | Status: DC | PRN
  Filled 2023-07-24: qty 90, 90d supply, fill #0

## 2023-07-24 MED ORDER — MELOXICAM 15 MG PO TABS
15.0000 mg | ORAL_TABLET | Freq: Every day | ORAL | 2 refills | Status: DC
  Filled 2023-07-24: qty 30, 30d supply, fill #0
  Filled 2023-09-14: qty 30, 30d supply, fill #1
  Filled 2023-10-18: qty 30, 30d supply, fill #2

## 2023-07-24 MED ORDER — ALBUTEROL SULFATE HFA 108 (90 BASE) MCG/ACT IN AERS
2.0000 | INHALATION_SPRAY | Freq: Four times a day (QID) | RESPIRATORY_TRACT | 2 refills | Status: DC | PRN
  Filled 2023-07-24: qty 6.7, 25d supply, fill #0

## 2023-07-24 MED ORDER — MOMETASONE FURO-FORMOTEROL FUM 100-5 MCG/ACT IN AERO
2.0000 | INHALATION_SPRAY | Freq: Two times a day (BID) | RESPIRATORY_TRACT | 3 refills | Status: DC
  Filled 2023-07-24: qty 13, 30d supply, fill #0

## 2023-07-24 MED ORDER — VENLAFAXINE HCL ER 75 MG PO CP24
75.0000 mg | ORAL_CAPSULE | Freq: Every day | ORAL | 3 refills | Status: DC
  Filled 2023-07-24: qty 30, 30d supply, fill #0
  Filled 2023-09-14: qty 30, 30d supply, fill #1
  Filled 2023-10-18: qty 30, 30d supply, fill #2

## 2023-07-24 NOTE — Progress Notes (Signed)
Subjective:  Patient ID: Eileen [name], female    DOB: 10-27-1970  Age: 52 y.o. MRN: [id number]  CC: Medical Management of Chronic Issues (Discuss menopause/Right hand pain/Restart depression medication)   HPI Eileen [name] is a 52 y.o. year old female with a history of Asthma, prediabetes.  Interval History: Discussed the use of AI scribe software for clinical note transcription with the patient, who gave verbal consent to proceed.  She presents with multiple complaints. She expresses a desire to restart her depression medication, citing feelings of extreme sadness and occasional thoughts of not wanting to be alive. She attributes these feelings to the pressure of being both a father and a mother.  The patient also reports difficulty sleeping for the past two weeks. She is unsure if this is related to menopause, as she has been experiencing severe hot flashes.  She also mentions pain in her hand and the back of her neck, which she believes may be contributing to her sleep disturbances.  The patient has a long-standing issue with her right hand, which has been diagnosed as carpal tunnel syndrome. However, she reports that the pain has recently increased and now affects her entire hand. She also experiences a tingling sensation in her hand during the day. She has been using a brace for her hand, but it is no longer providing relief and is causing discomfort.  In addition to the hand pain, the patient is experiencing pain in the back of her neck. She believes this may be related to stress. She denies any radiation of the pain to her arms.        Past Medical History:  Diagnosis Date   Allergy    Pollen    Arthritis    Asthma    CHF (congestive heart failure) (HCC)    "unsure thinks lungs had fluid around after surgery 7 years ago"   Chronic kidney disease    kidney infection   Neuromuscular disorder (HCC)    HERNIA   Right inguinal hernia    Shortness of breath      Past Surgical History:  Procedure Laterality Date   CESAREAN SECTION     x2   CESAREAN SECTION     COLONOSCOPY     HERNIA REPAIR  09/12/11   RIH   HERNIA REPAIR     INGUINAL HERNIA REPAIR  09/12/2011   Procedure: HERNIA REPAIR INGUINAL ADULT;  Surgeon: Atilano Ina, MD;  Location: [facility] OR;  Service: General;  Laterality: Right;  open right inguinal hernia with mesh    POLYPECTOMY     TUBAL LIGATION      Family History  Problem Relation Age of Onset   Ulcers Father        stomach   Diabetes Brother    Esophageal cancer Nephew    Diabetes Brother    Colon cancer Neg Hx    Rectal cancer Neg Hx    Stomach cancer Neg Hx    Breast cancer Neg Hx    Colon polyps Neg Hx     Social History   Socioeconomic History   Marital status: Legally Separated    Spouse name: Not on file   Number of children: 4   Years of education: Not on file   Highest education level: 6th grade  Occupational History   Occupation: Cleaning  Tobacco Use   Smoking status: Former    Current packs/day: 0.50    Average packs/day: 0.5 packs/day for 20.0 years (10.0 ttl pk-yrs)  Types: Cigarettes   Smokeless tobacco: Never   Tobacco comments:    recently quit  Vaping Use   Vaping status: Never Used  Substance and Sexual Activity   Alcohol use: No   Drug use: No   Sexual activity: Yes    Birth control/protection: Surgical  Other Topics Concern   Not on file  Social History Narrative   ** Merged History Encounter **       Social Determinants of Health   Financial Resource Strain: Not on file  Food Insecurity: Not on file  Transportation Needs: No Transportation Needs (05/06/2020)   PRAPARE - Administrator, Civil Service (Medical): No    Lack of Transportation (Non-Medical): No  Physical Activity: Not on file  Stress: Not on file  Social Connections: Not on file    No Known Allergies  Outpatient Medications Prior to Visit  Medication Sig Dispense Refill   albuterol  (PROAIR HFA) 108 (90 Base) MCG/ACT inhaler INHALE 2 PUFFS INTO THE LUNGS EVERY 6 (SIX) HOURS AS NEEDED FOR WHEEZING OR SHORTNESS OF BREATH. 6.7 g 2   meloxicam (MOBIC) 15 MG tablet Take 1 tablet (15 mg total) by mouth daily. 30 tablet 1   acetaminophen (TYLENOL) 500 MG tablet Take 500 mg by mouth every 6 (six) hours as needed for mild pain, headache or fever. (Patient not taking: Reported on 11/15/2022)     amoxicillin-clavulanate (AUGMENTIN) 875-125 MG tablet Take 1 tablet by mouth 2 (two) times daily. (Patient not taking: Reported on 11/15/2022) 20 tablet 0   benzonatate (TESSALON) 100 MG capsule Take 1 capsule (100 mg total) by mouth 2 (two) times daily as needed for cough. (Patient not taking: Reported on 11/15/2022) 20 capsule 0   cetirizine (ZYRTEC) 10 MG tablet Take 10 mg by mouth daily as needed for allergies. (Patient not taking: Reported on 11/15/2022)     gabapentin (NEURONTIN) 300 MG capsule Take 1 capsule (300 mg total) by mouth at bedtime. (Patient not taking: Reported on 11/15/2022) 30 capsule 3   guaiFENesin-dextromethorphan (ROBITUSSIN DM) 100-10 MG/5ML syrup Take 5 mLs by mouth every 4 (four) hours as needed for cough. (Patient not taking: Reported on 10/12/2022) 118 mL 0   montelukast (SINGULAIR) 10 MG tablet Take 1 tablet (10 mg total) by mouth at bedtime. (Patient not taking: Reported on 11/15/2022) 30 tablet 3   predniSONE (DELTASONE) 20 MG tablet Take 1 tablet (20 mg total) by mouth daily with breakfast. (Patient not taking: Reported on 11/15/2022) 5 tablet 0   mometasone-formoterol (DULERA) 100-5 MCG/ACT AERO Inhale 2 puffs into the lungs in the morning and at bedtime. (Patient not taking: Reported on 11/15/2022) 13 g 3   No facility-administered medications prior to visit.     ROS Review of Systems  Constitutional:  Negative for activity change and appetite change.  HENT:  Negative for sinus pressure and sore throat.   Respiratory:  Negative for chest tightness, shortness of  breath and wheezing.   Cardiovascular:  Negative for chest pain and palpitations.  Gastrointestinal:  Negative for abdominal distention, abdominal pain and constipation.  Genitourinary: Negative.   Musculoskeletal:        See HPI  Psychiatric/Behavioral:  Positive for sleep disturbance. Negative for behavioral problems and dysphoric mood.     Objective:  BP 115/73   Pulse 84   Ht 5\' 1"  (1.549 m)   Wt 178 lb 6.4 oz (80.9 kg)   SpO2 98%   BMI 33.71 kg/m  07/24/2023    4:03 PM 11/15/2022    2:51 PM 10/12/2022    1:59 PM  BP/Weight  Systolic BP 115 110 114  Diastolic BP 73 73 72  Wt. (Lbs) 178.4 193 199.2  BMI 33.71 kg/m2 36.47 kg/m2 37.64 kg/m2      Physical Exam Constitutional:      Appearance: She is well-developed.  Cardiovascular:     Rate and Rhythm: Normal rate.     Heart sounds: Normal heart sounds. No murmur heard. Pulmonary:     Effort: Pulmonary effort is normal.     Breath sounds: Normal breath sounds. No wheezing or rales.  Chest:     Chest wall: No tenderness.  Abdominal:     General: Bowel sounds are normal. There is no distension.     Palpations: Abdomen is soft. There is no mass.     Tenderness: There is no abdominal tenderness.  Musculoskeletal:     Cervical back: Normal range of motion. No tenderness.     Right lower leg: No edema.     Left lower leg: No edema.     Comments: Erythema and slight edema of MCP joints of right index and right middle finger.  Able to make a fist bilaterally  Neurological:     Mental Status: She is alert and oriented to person, place, and time.  Psychiatric:        Mood and Affect: Mood normal.        Latest Ref Rng & Units 11/15/2022    3:37 PM 10/12/2022    2:29 PM 04/19/2022    3:16 PM  CMP  Glucose 70 - 99 mg/dL 161  096  045   BUN 6 - 24 mg/dL 12  14  19    Creatinine 0.57 - 1.00 mg/dL 4.09  8.11  9.14   Sodium 134 - 144 mmol/L 140  142  140   Potassium 3.5 - 5.2 mmol/L 4.1  4.4  4.3   Chloride 96 -  106 mmol/L 106  105  103   CO2 20 - 29 mmol/L 20  19  20    Calcium 8.7 - 10.2 mg/dL 9.4  9.4  9.8   Total Protein 6.0 - 8.5 g/dL 6.6   6.6   Total Bilirubin 0.0 - 1.2 mg/dL 0.3   0.3   Alkaline Phos 44 - 121 IU/L 110   116   AST 0 - 40 IU/L 18   20   ALT 0 - 32 IU/L 32   33     Lipid Panel     Component Value Date/Time   CHOL 170 11/18/2015 0942   TRIG 85 11/18/2015 0942   HDL 48 11/18/2015 0942   CHOLHDL 3.5 11/18/2015 0942   CHOLHDL 4.0 Ratio 06/28/2010 2048   VLDL 26 06/28/2010 2048   LDLCALC 105 (H) 11/18/2015 0942    CBC    Component Value Date/Time   WBC 9.1 12/08/2020 2210   RBC 4.08 12/08/2020 2210   HGB 12.8 12/08/2020 2210   HGB 13.7 09/06/2018 1230   HCT 38.0 12/08/2020 2210   HCT 40.2 09/06/2018 1230   PLT 270 12/08/2020 2210   PLT 304 09/06/2018 1230   MCV 93.1 12/08/2020 2210   MCV 92 09/06/2018 1230   MCH 31.4 12/08/2020 2210   MCHC 33.7 12/08/2020 2210   RDW 13.3 12/08/2020 2210   RDW 13.1 09/06/2018 1230   LYMPHSABS 2.7 08/04/2020 1939   MONOABS 1.2 (H) 08/04/2020 1939   EOSABS 0.1  08/04/2020 1939   BASOSABS 0.1 08/04/2020 1939    Lab Results  Component Value Date   HGBA1C 6.0 (H) 04/19/2022    Assessment & Plan:      Depression Reports feeling very down and sometimes not wanting to be here. Stress from being a single parent contributing to mood. -Menopause also contributing to mood -Initiate Effexor which will also help with menopausal symptoms  Menopause Reports hot flashes and difficulty sleeping, possibly related to menopause. -Prescribe Effexor  Arthritis Reports increased pain and swelling in right hand, previously diagnosed with carpal tunnel syndrome. -Prescribe meloxicam for arthritis to reduce inflammation and swelling in knuckles.  Neck Pain Reports pain in the back of the neck, possibly stress-related. -Prescribe muscle relaxant (Flexeril) for neck pain.  Asthma -Controlled Requested refill for asthma  medication. -Refill two inhalers.  Prediabetes Patient inquired about prediabetes status. Last results from August showed prediabetes with A1c of 6.0 -Advise to continue exercise and healthy eating to prevent progression to diabetes.  Follow-up in three months to assess menopausal symptoms and depression.          Meds ordered this encounter  Medications   venlafaxine XR (EFFEXOR XR) 75 MG 24 hr capsule    Sig: Take 1 capsule (75 mg total) by mouth daily with breakfast.    Dispense:  30 capsule    Refill:  3   meloxicam (MOBIC) 15 MG tablet    Sig: Take 1 tablet (15 mg total) by mouth daily.    Dispense:  30 tablet    Refill:  2   cyclobenzaprine (FLEXERIL) 10 MG tablet    Sig: Take 1 tablet (10 mg total) by mouth at bedtime as needed for muscle spasms.    Dispense:  90 tablet    Refill:  0   albuterol (PROAIR HFA) 108 (90 Base) MCG/ACT inhaler    Sig: INHALE 2 PUFFS INTO THE LUNGS EVERY 6 (SIX) HOURS AS NEEDED FOR WHEEZING OR SHORTNESS OF BREATH.    Dispense:  6.7 g    Refill:  2   mometasone-formoterol (DULERA) 100-5 MCG/ACT AERO    Sig: Inhale 2 puffs into the lungs in the morning and at bedtime.    Dispense:  13 g    Refill:  3    Follow-up: Return in about 3 months (around 10/24/2023) for Depression.       Hoy Register, MD, FAAFP. [facility] [place], [place] [phone]   07/24/2023, 5:16 PM

## 2023-07-24 NOTE — Patient Instructions (Signed)
Menopausia Menopause La menopausia es el perodo normal de la vida de una mujer en el que los perodos menstruales cesan por completo. Marca el fin natural de la capacidad de [place] mujer de [place]. Se puede definir como la ausencia del perodo menstrual durante 12 meses sin otra causa mdica. La transicin a Secondary school teacher (perimenopausia) con mayor frecuencia ocurre entre los 45 y los [address], y puede durar [employer]. Durante la perimenopausia, los niveles hormonales cambian en el [place], lo que puede provocar sntomas y Ship broker. La menopausia podra aumentar el riesgo de sufrir lo siguiente: Huesos debilitados (osteoporosis), lo cual causa fracturas. Depresin. Endurecimiento y Scientist, research (medical) de las arterias (aterosclerosis), lo que puede causar infartos de miocardio y accidentes cerebrovasculares. Cules son las causas? A menudo, la causa de esta afeccin es un cambio natural en los niveles hormonales, que ocurre a medida que envejece. La menopausia tambin puede ser causada por cambios que no son naturales, entre ellos: [place] para extirpar ambos ovarios (menopausia [place]). Efectos secundarios de [employer], como la quimioterapia [place] para tratar el cncer (menopausia qumica). Qu incrementa el riesgo? Es ms probable que la menopausia comience a una edad ms temprana si tiene ciertas afecciones o se ha realizado ciertos tratamientos, incluidos los siguientes: Un tumor en la hipfisis del cerebro. Una enfermedad que afecte los ovarios y las hormonas. Ciertos tratamientos para el cncer, como quimioterapia o terapia hormonal, o radioterapia en la pelvis. Fumar mucho y consumir alcohol de forma excesiva. Antecedentes familiares de menopausia temprana. Adems, es ms probable que esta afeccin se presente de manera temprana en las mujeres que son [place]. Cules son los signos o sntomas? Los sntomas de esta afeccin  incluyen: Engineer, maintenance. Perodos menstruales irregulares. Sudoracin nocturna. Cambios en el sentimiento respecto de las [employer]. Es posible que el deseo sexual disminuya o que se sienta ms incmoda respecto de su sexualidad. Sequedad vaginal y adelgazamiento de las paredes de la vagina. Esto puede causar [employer]. Sequedad de la piel y aparicin de Banker. Dolores de [place]. Problemas para dormir (insomnio). Cambios de humor o irritabilidad. Problemas de memoria. Aumento de [place]. Crecimiento de vello en la cara y el pecho. Infecciones en la vejiga o problemas para orinar. Cmo se diagnostica? Esta afeccin se diagnostica en funcin de los antecedentes mdicos, un examen fsico, la edad, los antecedentes menstruales y los sntomas. Tambin le podran realizar estudios hormonales. Cmo se trata? En algunos los casos, no se necesita tratamiento. Usted y el mdico deben decidir juntos si se debe Pensions consultant. El tratamiento se determinar en funcin de su cuadro clnico y de sus preferencias. El tratamiento de este cuadro clnico se centra en el control de los sntomas. El tratamiento puede incluir: Terapia hormonal para la menopausia. Medicamentos para tratar sntomas o complicaciones especficos. Acupuntura. Vitaminas o suplementos herbales. Antes de Microbiologist, es importante que le avise al mdico si tiene antecedentes personales o familiares de estas afecciones: Enfermedad cardaca. Cncer de mama. Cogulos de [place]. Diabetes. Osteoporosis. Siga estas instrucciones en su casa: Estilo de vida No consuma ningn producto que contenga nicotina o tabaco, como cigarrillos, cigarrillos electrnicos y tabaco de Theatre manager. Si necesita ayuda para dejar de consumir estos productos, consulte al mdico. Realice, por lo menos, 30 minutos de actividad fsica 5 das por semana o ms. Evite las bebidas con alcohol o cafena, as como las  [employer]. Esto podra ayudar a prevenir los acaloramientos. Intente dormir de 7 a 8 horas  todas las noches. Si tiene acaloramientos: Statistician. Evite las cosas que podran [employer] acaloramientos, como las comidas muy condimentadas, los lugares calientes o el estrs. Respire profundamente y despacio cuando comience a Scientist, water quality. Tenga un ventilador en su casa y en su oficina. Encuentre modos de [employer], por ejemplo, a travs de la respiracin, meditacin o un diario ntimo. Considere la posibilidad de asistir a terapia grupal con otras mujeres que tengan sntomas de [place]. Pdale recomendaciones al [employer] reuniones de terapia grupal. Comida y bebida  Siga una dieta saludable y equilibrada que incluya cereales integrales, protenas magras, productos lcteos descremados y [place] frutas y verduras. El mdico podra recomendarle que agregue una mayor cantidad de soja a su dieta. Algunos de los alimentos que contienen soja son el tofu, el tempeh y la [place] de soja. Consuma muchos alimentos que contengan calcio y vitamina D para mejorar la salud sea. Algunos productos que contienen mucho calcio son los [employer] Products, el yogur, los frijoles, las [place], las sardinas, el brcoli y la col rizada. Medicamentos Use los medicamentos de venta libre y los recetados solamente como se lo haya indicado el mdico. Hable con el mdico antes de Corporate investment banker a Health visitor suplemento herbal. Tome las vitaminas y los suplementos recetados como se lo haya indicado el mdico. Indicaciones generales  Lleve un registro de sus perodos menstruales; incluya lo siguiente: El momento en que ocurren. Qu tan abundantes son y cunto duran. Cunto tiempo transcurre entre cada perodo menstrual. Lleve un registro de los sntomas; anote cundo comienzan, con qu frecuencia ocurren y cunto duran. Use lubricantes o humectantes vaginales para aliviar la  sequedad vaginal y Personnel officer durante las relaciones sexuales. Cumpla con todas las visitas de seguimiento. Esto es importante. Esto incluye la terapia grupal y la psicoterapia. Comunquese con un mdico si: An tiene perodos menstruales despus de los 55 aos. Siente dolor durante las [employer]. No tuvo perodos menstruales durante los ltimos 12 meses y presenta sangrado vaginal. Solicite ayuda de inmediato si tiene: Depresin grave. Sangrado vaginal excesivo. Dolor al Beatrix Shipper. Latidos cardacos rpidos o irregulares (palpitaciones). Dolor de cabeza intenso. Dolor en el abdomen o indigestin grave. Resumen La menopausia es un perodo normal de la vida de Nurse, mental health en el que los perodos menstruales cesan por completo. Se define normalmente como la ausencia del periodo menstrual durante 12 meses sin otra causa mdica. La transicin a Secondary school teacher (perimenopausia) con mayor frecuencia ocurre entre los 45 y los [address], y puede durar varios aos. Los sntomas pueden controlarse mediante medicamentos, cambios en el estilo de vida y terapias complementarias, como acupuntura. Siga una dieta equilibrada que incluya muchos nutrientes para Biochemist, clinical salud sea y la salud cardaca, y para Chief Operating Officer los sntomas durante la [place]. Esta informacin no tiene Theme park manager el consejo del mdico. Asegrese de hacerle al mdico cualquier pregunta que tenga. Document Revised: 03/16/2020 Document Reviewed: 03/16/2020 Elsevier Patient Education  2024 [employer].

## 2023-07-25 ENCOUNTER — Other Ambulatory Visit: Payer: Self-pay

## 2023-07-25 ENCOUNTER — Telehealth: Payer: Self-pay

## 2023-07-25 NOTE — Telephone Encounter (Signed)
Copied from CRM [id number]. Topic: General - Other >> Jul 25, 2023  8:53 AM Phill Myron wrote: Pt Eileen [name] called and stated provider Newlin and I discussed menopause medication, will she be sending the medication to the pharmacy today? Please advise.

## 2023-07-25 NOTE — Telephone Encounter (Signed)
Pt has been informed that medication was sent to pharmacy and pharmacy states that medication has been picked up.

## 2023-09-14 ENCOUNTER — Other Ambulatory Visit: Payer: Self-pay

## 2023-09-17 ENCOUNTER — Other Ambulatory Visit: Payer: Self-pay

## 2023-09-18 ENCOUNTER — Other Ambulatory Visit: Payer: Self-pay

## 2023-10-18 ENCOUNTER — Other Ambulatory Visit: Payer: Self-pay

## 2023-10-22 ENCOUNTER — Other Ambulatory Visit: Payer: Self-pay

## 2023-10-24 ENCOUNTER — Ambulatory Visit: Payer: Self-pay | Attending: Family Medicine | Admitting: Family Medicine

## 2023-10-24 ENCOUNTER — Encounter: Payer: Self-pay | Admitting: Family Medicine

## 2023-10-24 ENCOUNTER — Telehealth: Payer: Self-pay | Admitting: Licensed Clinical Social Worker

## 2023-10-24 ENCOUNTER — Other Ambulatory Visit: Payer: Self-pay

## 2023-10-24 VITALS — BP 124/77 | HR 98 | Ht 61.0 in | Wt 183.4 lb

## 2023-10-24 DIAGNOSIS — N951 Menopausal and female climacteric states: Secondary | ICD-10-CM

## 2023-10-24 DIAGNOSIS — R7303 Prediabetes: Secondary | ICD-10-CM

## 2023-10-24 DIAGNOSIS — R5383 Other fatigue: Secondary | ICD-10-CM

## 2023-10-24 DIAGNOSIS — M19041 Primary osteoarthritis, right hand: Secondary | ICD-10-CM

## 2023-10-24 DIAGNOSIS — F3289 Other specified depressive episodes: Secondary | ICD-10-CM

## 2023-10-24 LAB — POCT GLYCOSYLATED HEMOGLOBIN (HGB A1C): HbA1c, POC (prediabetic range): 5.8 % (ref 5.7–6.4)

## 2023-10-24 MED ORDER — VENLAFAXINE HCL ER 150 MG PO CP24
150.0000 mg | ORAL_CAPSULE | Freq: Every day | ORAL | 1 refills | Status: DC
  Filled 2023-10-24: qty 90, 90d supply, fill #0
  Filled 2024-02-01: qty 90, 90d supply, fill #1

## 2023-10-24 MED ORDER — HYDROXYZINE HCL 25 MG PO TABS
25.0000 mg | ORAL_TABLET | Freq: Every evening | ORAL | 1 refills | Status: DC | PRN
  Filled 2023-10-24: qty 90, 90d supply, fill #0

## 2023-10-24 MED ORDER — VENLAFAXINE HCL ER 75 MG PO CP24
75.0000 mg | ORAL_CAPSULE | Freq: Every day | ORAL | 1 refills | Status: DC
  Filled 2023-10-24: qty 90, 90d supply, fill #0

## 2023-10-24 NOTE — Telephone Encounter (Signed)
 Met with pt during a warm hand off, pt was referred by PCP for depression. Therapist was able to introduce herself and schedule an appointment for pt for therapy.

## 2023-10-24 NOTE — Progress Notes (Addendum)
 Subjective:  Patient ID: Eileen [name], female    DOB: [birth year]  Age: 53 y.o. MRN: [id number]  CC: Depression (Hand pain)   HPI: Eileen [name] is a 53 y.o. year old female with a medical history significant for Asthma, prediabetes, depression.  Discussed the use of AI scribe software for clinical note transcription with the patient, who gave verbal consent to proceed.  History of Present Illness The patient, a widow, presents with ongoing symptoms of depression and menopause. She reports that her current medication, Effexor , has been somewhat helpful but expresses a desire for a stronger medication. She also reports experiencing hot flashes, which have been somewhat controlled with medication.  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 She reports that this medication has been somewhat helpful. Additionally, the patient has been taking a muscle relaxant for neck pain, which she reports has been causing her to have nightmares.  Her chart reveals she was prescribed cyclobenzaprine  but she would like to stay on it because she states it has been beneficial.  The patient also expresses concerns about financial difficulties.    Past Medical History:  Diagnosis Date   Allergy    Pollen    Arthritis    Asthma    CHF (congestive heart failure) (HCC)    unsure thinks lungs had fluid around after surgery 7 years ago   Chronic kidney disease    kidney infection   Neuromuscular disorder (HCC)    HERNIA   Right inguinal hernia    Shortness of breath     Past Surgical History:  Procedure Laterality Date   CESAREAN SECTION     x2   CESAREAN SECTION     COLONOSCOPY     HERNIA REPAIR  09/12/11   RIH   HERNIA REPAIR     INGUINAL HERNIA REPAIR  09/12/2011   Procedure: HERNIA REPAIR INGUINAL ADULT;  Surgeon: Camellia CHRISTELLA Blush, MD;  Location: [facility] OR;  Service: General;   Laterality: Right;  open right inguinal hernia with mesh    POLYPECTOMY     TUBAL LIGATION      Family History  Problem Relation Age of Onset   Ulcers Father        stomach   Diabetes Brother    Esophageal cancer Nephew    Diabetes Brother    Colon cancer Neg Hx    Rectal cancer Neg Hx    Stomach cancer Neg Hx    Breast cancer Neg Hx    Colon polyps Neg Hx     Social History   Socioeconomic History   Marital status: Legally Separated    Spouse name: Not on file   Number of children: 4   Years of education: Not on file   Highest education level: 6th grade  Occupational History   Occupation: Education officer, environmental  Tobacco Use   Smoking status: Former    Current packs/day: 0.50    Average packs/day: 0.5 packs/day for 20.0 years (10.0 ttl pk-yrs)    Types: Cigarettes   Smokeless tobacco: Never   Tobacco comments:    recently quit  Vaping Use   Vaping status: Never Used  Substance and Sexual Activity   Alcohol use: No   Drug use: No   Sexual activity: Yes    Birth control/protection: Surgical  Other Topics Concern   Not on file  Social History Narrative   ** Merged History Encounter **  Social Drivers of Corporate investment banker Strain: Not on file  Food Insecurity: Not on file  Transportation Needs: No Transportation Needs (05/06/2020)   PRAPARE - Administrator, Civil Service (Medical): No    Lack of Transportation (Non-Medical): No  Physical Activity: Not on file  Stress: Not on file  Social Connections: Not on file    No Known Allergies  Outpatient Medications Prior to Visit  Medication Sig Dispense Refill   albuterol  (PROAIR  HFA) 108 (90 Base) MCG/ACT inhaler INHALE 2 PUFFS INTO THE LUNGS EVERY 6 (SIX) HOURS AS NEEDED FOR WHEEZING OR SHORTNESS OF BREATH. 6.7 g 2   cyclobenzaprine  (FLEXERIL ) 10 MG tablet Take 1 tablet (10 mg total) by mouth at bedtime as needed for muscle spasms. 90 tablet 0   meloxicam  (MOBIC ) 15 MG tablet Take 1 tablet (15 mg  total) by mouth daily. 30 tablet 2   mometasone -formoterol  (DULERA ) 100-5 MCG/ACT AERO Inhale 2 puffs into the lungs in the morning and at bedtime. 13 g 3   venlafaxine  XR (EFFEXOR  XR) 75 MG 24 hr capsule Take 1 capsule (75 mg total) by mouth daily with breakfast. 30 capsule 3   acetaminophen  (TYLENOL ) 500 MG tablet Take 500 mg by mouth every 6 (six) hours as needed for mild pain, headache or fever. (Patient not taking: Reported on 10/24/2023)     benzonatate  (TESSALON ) 100 MG capsule Take 1 capsule (100 mg total) by mouth 2 (two) times daily as needed for cough. (Patient not taking: Reported on 10/24/2023) 20 capsule 0   cetirizine  (ZYRTEC ) 10 MG tablet Take 10 mg by mouth daily as needed for allergies. (Patient not taking: Reported on 11/15/2022)     gabapentin  (NEURONTIN ) 300 MG capsule Take 1 capsule (300 mg total) by mouth at bedtime. (Patient not taking: Reported on 10/24/2023) 30 capsule 3   guaiFENesin -dextromethorphan  (ROBITUSSIN DM) 100-10 MG/5ML syrup Take 5 mLs by mouth every 4 (four) hours as needed for cough. (Patient not taking: Reported on 10/24/2023) 118 mL 0   montelukast  (SINGULAIR ) 10 MG tablet Take 1 tablet (10 mg total) by mouth at bedtime. (Patient not taking: Reported on 10/24/2023) 30 tablet 3   predniSONE  (DELTASONE ) 20 MG tablet Take 1 tablet (20 mg total) by mouth daily with breakfast. (Patient not taking: Reported on 10/24/2023) 5 tablet 0   amoxicillin -clavulanate (AUGMENTIN ) 875-125 MG tablet Take 1 tablet by mouth 2 (two) times daily. (Patient not taking: Reported on 10/24/2023) 20 tablet 0   No facility-administered medications prior to visit.     ROS Review of Systems  Constitutional:  Negative for activity change and appetite change.  HENT:  Negative for sinus pressure and sore throat.   Respiratory:  Negative for chest tightness, shortness of breath and wheezing.   Cardiovascular:  Negative for chest pain and palpitations.  Gastrointestinal:  Negative for abdominal  distention, abdominal pain and constipation.  Genitourinary: Negative.   Musculoskeletal: Negative.   Psychiatric/Behavioral:  Positive for dysphoric mood. Negative for behavioral problems.     Objective:  BP 124/77   Pulse 98   Ht 5' 1 (1.549 m)   Wt 183 lb 6.4 oz (83.2 kg)   SpO2 97%   BMI 34.65 kg/m      10/24/2023    4:29 PM 07/24/2023    4:03 PM 11/15/2022    2:51 PM  BP/Weight  Systolic BP 124 115 110  Diastolic BP 77 73 73  Wt. (Lbs) 183.4 178.4 193  BMI 34.65 kg/m2  33.71 kg/m2 36.47 kg/m2      Physical Exam Constitutional:      Appearance: She is well-developed.  Cardiovascular:     Rate and Rhythm: Normal rate.     Heart sounds: Normal heart sounds. No murmur heard. Pulmonary:     Effort: Pulmonary effort is normal.     Breath sounds: Normal breath sounds. No wheezing or rales.  Chest:     Chest wall: No tenderness.  Abdominal:     General: Bowel sounds are normal. There is no distension.     Palpations: Abdomen is soft. There is no mass.     Tenderness: There is no abdominal tenderness.  Musculoskeletal:        General: Normal range of motion.     Right lower leg: No edema.     Left lower leg: No edema.     Comments: Normal, Able to make a fist bilaterally  Neurological:     Mental Status: She is alert and oriented to person, place, and time.  Psychiatric:        Mood and Affect: Mood normal.        Latest Ref Rng & Units 11/15/2022    3:37 PM 10/12/2022    2:29 PM 04/19/2022    3:16 PM  CMP  Glucose 70 - 99 mg/dL 874  896  861   BUN 6 - 24 mg/dL 12  14  19    Creatinine 0.57 - 1.00 mg/dL 9.31  9.42  9.20   Sodium 134 - 144 mmol/L 140  142  140   Potassium 3.5 - 5.2 mmol/L 4.1  4.4  4.3   Chloride 96 - 106 mmol/L 106  105  103   CO2 20 - 29 mmol/L 20  19  20    Calcium 8.7 - 10.2 mg/dL 9.4  9.4  9.8   Total Protein 6.0 - 8.5 g/dL 6.6   6.6   Total Bilirubin 0.0 - 1.2 mg/dL 0.3   0.3   Alkaline Phos 44 - 121 IU/L 110   116   AST 0 - 40 IU/L  18   20   ALT 0 - 32 IU/L 32   33     Lipid Panel     Component Value Date/Time   CHOL 170 11/18/2015 0942   TRIG 85 11/18/2015 0942   HDL 48 11/18/2015 0942   CHOLHDL 3.5 11/18/2015 0942   CHOLHDL 4.0 Ratio 06/28/2010 2048   VLDL 26 06/28/2010 2048   LDLCALC 105 (H) 11/18/2015 0942    CBC    Component Value Date/Time   WBC 9.1 12/08/2020 2210   RBC 4.08 12/08/2020 2210   HGB 12.8 12/08/2020 2210   HGB 13.7 09/06/2018 1230   HCT 38.0 12/08/2020 2210   HCT 40.2 09/06/2018 1230   PLT 270 12/08/2020 2210   PLT 304 09/06/2018 1230   MCV 93.1 12/08/2020 2210   MCV 92 09/06/2018 1230   MCH 31.4 12/08/2020 2210   MCHC 33.7 12/08/2020 2210   RDW 13.3 12/08/2020 2210   RDW 13.1 09/06/2018 1230   LYMPHSABS 2.7 08/04/2020 1939   MONOABS 1.2 (H) 08/04/2020 1939   EOSABS 0.1 08/04/2020 1939   BASOSABS 0.1 08/04/2020 1939    Lab Results  Component Value Date   HGBA1C 5.8 10/24/2023   Lab Results  Component Value Date   TSH 1.600 09/06/2018        10/24/2023    4:30 PM 07/24/2023    4:05 PM 10/12/2022  2:00 PM 04/19/2022    2:49 PM 02/09/2022   10:55 AM  Depression screen PHQ 2/9  Decreased Interest 2 2 3 1 3   Down, Depressed, Hopeless 1 2 3 1 3   PHQ - 2 Score 3 4 6 2 6   Altered sleeping 3 2 2 1 3   Tired, decreased energy 2 2 3 2 3   Change in appetite 2 1 3 1 2   Feeling bad or failure about yourself  2 1 3 1 1   Trouble concentrating 2 1 2 1 1   Moving slowly or fidgety/restless 2 1 1 1 1   Suicidal thoughts 1 1 1 1 1   PHQ-9 Score 17 13 21 10 18     Assessment & Plan:   Assessment & Plan Depression Persistent symptoms despite treatment with Effexor . Patient reports lack of motivation and desire to do anything. She also reports nightmares with cyclobenzaprine . -Effexor  has been slightly beneficial. -Increase from 75 mg to 150 mg of Effexor  -Add hydroxyzine  to help improve sleep quality. -Refer to therapy for additional support - LCSW called in to see  patient.  Menopausal symptoms Patient reports improvement in hot flashes with Effexor . -Continue Effexor .  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 -Continue meloxicam .   Prediabetes A1c of 5.8 Continue to work on lifestyle modifications  General Health Maintenance -Check Vitamin D  and thyroid levels to rule out any physiological causes for fatigue and lack of motivation.      Meds ordered this encounter  Medications   venlafaxine  XR (EFFEXOR  XR) 75 MG 24 hr capsule    Sig: Take 1 capsule (75 mg total) by mouth daily with breakfast.    Dispense:  90 capsule    Refill:  1   hydrOXYzine  (ATARAX ) 25 MG tablet    Sig: Take 1 tablet (25 mg total) by mouth at bedtime as needed.    Dispense:  90 tablet    Refill:  1    Follow-up: Return in about 3 months (around 01/21/2024) for Chronic medical conditions.       Corrina Sabin, MD, FAAFP. [facility] [place], [place] [phone]   10/24/2023, 5:04 PM

## 2023-10-24 NOTE — Patient Instructions (Signed)
 Menopausia: qu debe saber Menopause: What to Know La menopausia es el momento de la vida de una mujer en el que los perodos menstruales se detienen. Marca el final de su capacidad para quedar embarazada. Se puede definir como no tener el perodo durante 12 meses sin otra causa mdica. La etapa en que comienza a pasar a la menopausia se llama perimenopausia. Suele ocurrir [employer] 45 y los 55 aos. Puede durar [employer]. Durante la perimenopausia, los niveles hormonales del cuerpo [place]. Esto puede causar sntomas y Audiological scientist a Radiographer, therapeutic. La menopausia puede hacer que sea ms propensa a tener lo siguiente: [employer], que se quiebran con ms facilidad. Depresin. Esta ocurre cuando se siente tristeza o desesperanza. Endurecimiento y Scientist, research (medical) de las arterias. Estos pueden causar infarto de miocardio y accidente cerebrovascular. Cules son las causas? En la [employer], la menopausia es un cambio natural del cuerpo y los niveles hormonales que ocurre con el paso del [place]. Pero en algunos casos, puede deberse a cambios que no son naturales. Incluyen lo siguiente: [place] para [employer]. Efectos secundarios de algunos medicamentos. Qu incrementa el riesgo? Es ms probable que pase por la menopausia de forma temprana si: Tiene un crecimiento anormal (tumor) de la hipfisis en el cerebro. Tiene una enfermedad que [employer] ovarios. Recibe determinados tratamientos para el cncer. Incluyen lo siguiente: Quimioterapia. Terapia hormonal. Radioterapia en la zona que est entre las caderas (pelvis). Fuma mucho o bebe mucho alcohol. Otras personas de su familia han pasado por la menopausia de forma temprana. Es muy delgada. Cules son los signos o sntomas? Es posible que tenga: [place]. Perodos irregulares. Sudoracin nocturna. Cambios en cmo se siente con respecto al sexo. Usted puede: Tener menos deseo sexual. Sentir ms incomodidad  en torno a su sexualidad. Sequedad vaginal y adelgazamiento de las paredes de la vagina. Esto puede hacer que tener sexo le genere dolor. Cambios en la piel, por ejemplo: Piel seca. Arrugas nuevas. Dolores de [place]. Otros sntomas pueden incluir: Dificultad para dormir. Cambios en el estado de nimo. Problemas de memoria. Aumento de [place]. Crecimiento de vello en la cara y el pecho. Infecciones en la vejiga o dificultad para orinar. Cmo se diagnostica? Pueden diagnosticarle esta afeccin en funcin de lo siguiente: Sus antecedentes mdicos. Un examen. Su edad. Sus antecedentes de [employer]. Sus sntomas. Pruebas hormonales. Cmo se trata? En algunos los casos, no se necesita tratamiento. Hable con su mdico acerca de si debera recibir algn tratamiento. Los tratamientos pueden incluir lo siguiente: Terapia hormonal para la menopausia (THM). Medicamentos para tratar ciertos sntomas. Acupuntura. Vitaminas o suplementos herbales. Antes de Microbiologist, informe al mdico si usted o alguien de su familia tiene o ha tenido: Enfermedad cardaca. Cncer de mama. Cogulos de [place]. Diabetes. Osteoporosis. Siga estas instrucciones en su casa: Comida y bebida  Consuma una dieta equilibrada. Debe incluir: Nils Pyle y verduras frescas. Cereales integrales. Protenas magras. Lcteos con bajo contenido de [place]. Consuma muchos alimentos que contengan calcio y vitamina D. Estos pueden ayudar a [employer]. Los alimentos y bebidas con alto contenido de calcio y hierro son, Eusebio Me otros: [place]ogur y [employer]PG [employer]. Frijoles. Almendras. Sardinas. Brcoli y col rizada. Para ayudar a prevenir los acaloramientos, no consuma lo siguiente: Alcohol. Bebidas con cafena. Comidas condimentadas. Estilo de vida No fume, vapee ni consuma nicotina o tabaco. Intente dormir de 7 a 8 horas todas las noches. Si tiene acaloramientos, es recomendable que: Se  vista  con capas de ropa. Evite las cosas que podran [employer] acaloramientos, como los lugares calientes o el estrs. Respire profundamente y despacio cuando comience a Scientist, water quality. Tenga un ventilador en su casa y en su oficina. Busque maneras de Charity fundraiser. Tal vez deba intentar lo siguiente: Respiracin profunda. Meditacin. Escribir un diario. Consulte al [employer] terapia grupal. La terapia puede ayudarla a obtener apoyo de otras personas que estn pasando por la menopausia. Instrucciones generales  Hable con su mdico antes de tomar cualquier suplemento a base de hierbas. Lleve un registro de los sntomas. Registre: Cundo comienzan. Con qu frecuencia los tiene. Cunto tiempo duran. Use lubricantes o humectantes vaginales. Estos pueden ayudar con: Sequedad vaginal. La comodidad durante el sexo. Comunquese con un mdico si: Es mayor de 84 aos y an tiene Environmental manager. Siente dolor durante el sexo. No ha tenido un perodo menstrual durante 12 meses y luego comienza a Geophysicist/field seismologist por la vagina. Siente dolor al [employer]ocoPhillips. Tiene dolores de [place] muy intensos. Solicite ayuda de inmediato si: Est muy deprimida. Tiene mucho sangrado de la vagina. [employer] late [place] rpido. Siente un dolor muy intenso en el vientre o indigestin que no se alivia con medicamentos. Esta informacin no tiene Theme park manager el consejo del mdico. Asegrese de hacerle al mdico cualquier pregunta que tenga. Document Revised: 05/24/2023 Document Reviewed: 05/24/2023 Elsevier Patient Education  2024 [employer].

## 2023-10-25 ENCOUNTER — Other Ambulatory Visit: Payer: Self-pay

## 2023-10-25 ENCOUNTER — Other Ambulatory Visit: Payer: Self-pay | Admitting: Family Medicine

## 2023-10-25 LAB — CMP14+EGFR
ALT: 16 IU/L (ref 0–32)
AST: 17 IU/L (ref 0–40)
Albumin: 4.1 g/dL (ref 3.8–4.9)
Alkaline Phosphatase: 134 IU/L — ABNORMAL HIGH (ref 44–121)
BUN/Creatinine Ratio: 24 — ABNORMAL HIGH (ref 9–23)
BUN: 16 mg/dL (ref 6–24)
Bilirubin Total: 0.3 mg/dL (ref 0.0–1.2)
CO2: 21 mmol/L (ref 20–29)
Calcium: 9.3 mg/dL (ref 8.7–10.2)
Chloride: 105 mmol/L (ref 96–106)
Creatinine, Ser: 0.66 mg/dL (ref 0.57–1.00)
Globulin, Total: 2.4 g/dL (ref 1.5–4.5)
Glucose: 130 mg/dL — ABNORMAL HIGH (ref 70–99)
Potassium: 3.9 mmol/L (ref 3.5–5.2)
Sodium: 139 mmol/L (ref 134–144)
Total Protein: 6.5 g/dL (ref 6.0–8.5)
eGFR: 106 mL/min/{1.73_m2} (ref >59)

## 2023-10-25 LAB — VITAMIN D 25 HYDROXY (VIT D DEFICIENCY, FRACTURES): Vit D, 25-Hydroxy: 19.6 ng/mL — ABNORMAL LOW (ref 30.0–100.0)

## 2023-10-25 LAB — T3: T3, Total: 117 ng/dL (ref 71–180)

## 2023-10-25 LAB — TSH: TSH: 1.78 u[IU]/mL (ref 0.450–4.500)

## 2023-10-25 LAB — T4, FREE: Free T4: 1.16 ng/dL (ref 0.82–1.77)

## 2023-10-25 MED ORDER — ERGOCALCIFEROL 1.25 MG (50000 UT) PO CAPS
50000.0000 [IU] | ORAL_CAPSULE | ORAL | 1 refills | Status: DC
  Filled 2023-10-25: qty 12, 84d supply, fill #0

## 2023-10-26 ENCOUNTER — Other Ambulatory Visit: Payer: Self-pay

## 2023-10-31 ENCOUNTER — Institutional Professional Consult (permissible substitution): Payer: Self-pay | Admitting: Licensed Clinical Social Worker

## 2024-01-23 ENCOUNTER — Ambulatory Visit: Payer: Self-pay | Admitting: Family Medicine

## 2024-02-01 ENCOUNTER — Other Ambulatory Visit: Payer: Self-pay

## 2024-02-07 ENCOUNTER — Ambulatory Visit: Payer: Self-pay | Admitting: Family Medicine

## 2024-03-25 ENCOUNTER — Telehealth: Payer: Self-pay | Admitting: Family Medicine

## 2024-03-25 NOTE — Telephone Encounter (Signed)
 Called patient to confirm upcoming appointment 03/26/2024 at 3:30 pm. Patient appointment has been successfully confirmed

## 2024-03-26 ENCOUNTER — Encounter: Payer: Self-pay | Admitting: Family Medicine

## 2024-03-26 ENCOUNTER — Other Ambulatory Visit: Payer: Self-pay

## 2024-03-26 ENCOUNTER — Ambulatory Visit: Payer: Self-pay | Attending: Family Medicine | Admitting: Family Medicine

## 2024-03-26 VITALS — BP 117/82 | HR 88 | Ht 61.0 in | Wt 187.6 lb

## 2024-03-26 DIAGNOSIS — M545 Low back pain, unspecified: Secondary | ICD-10-CM

## 2024-03-26 DIAGNOSIS — E559 Vitamin D deficiency, unspecified: Secondary | ICD-10-CM

## 2024-03-26 DIAGNOSIS — M79671 Pain in right foot: Secondary | ICD-10-CM

## 2024-03-26 DIAGNOSIS — F3289 Other specified depressive episodes: Secondary | ICD-10-CM

## 2024-03-26 DIAGNOSIS — J4520 Mild intermittent asthma, uncomplicated: Secondary | ICD-10-CM

## 2024-03-26 DIAGNOSIS — Z1231 Encounter for screening mammogram for malignant neoplasm of breast: Secondary | ICD-10-CM

## 2024-03-26 DIAGNOSIS — N951 Menopausal and female climacteric states: Secondary | ICD-10-CM

## 2024-03-26 DIAGNOSIS — F32A Depression, unspecified: Secondary | ICD-10-CM | POA: Insufficient documentation

## 2024-03-26 MED ORDER — ALBUTEROL SULFATE HFA 108 (90 BASE) MCG/ACT IN AERS
2.0000 | INHALATION_SPRAY | Freq: Four times a day (QID) | RESPIRATORY_TRACT | 2 refills | Status: AC | PRN
  Filled 2024-03-26: qty 6.7, 25d supply, fill #0

## 2024-03-26 MED ORDER — HYDROXYZINE HCL 25 MG PO TABS
25.0000 mg | ORAL_TABLET | Freq: Every evening | ORAL | 1 refills | Status: AC | PRN
  Filled 2024-03-26: qty 90, 90d supply, fill #0

## 2024-03-26 MED ORDER — DICLOFENAC SODIUM 1 % EX GEL
4.0000 g | Freq: Four times a day (QID) | CUTANEOUS | 1 refills | Status: DC
  Filled 2024-03-26: qty 100, 30d supply, fill #0

## 2024-03-26 MED ORDER — ERGOCALCIFEROL 1.25 MG (50000 UT) PO CAPS
50000.0000 [IU] | ORAL_CAPSULE | ORAL | 1 refills | Status: AC
  Filled 2024-03-26: qty 12, 84d supply, fill #0
  Filled 2024-06-27: qty 12, 84d supply, fill #1

## 2024-03-26 MED ORDER — CETIRIZINE HCL 10 MG PO TABS
10.0000 mg | ORAL_TABLET | Freq: Every day | ORAL | 1 refills | Status: AC
  Filled 2024-03-26: qty 90, 90d supply, fill #0
  Filled 2024-09-25: qty 90, 90d supply, fill #1

## 2024-03-26 MED ORDER — VENLAFAXINE HCL ER 150 MG PO CP24
150.0000 mg | ORAL_CAPSULE | Freq: Every day | ORAL | 1 refills | Status: AC
  Filled 2024-03-26 – 2024-05-07 (×2): qty 90, 90d supply, fill #0
  Filled 2024-09-24 – 2024-09-25 (×2): qty 90, 90d supply, fill #1

## 2024-03-26 MED ORDER — CYCLOBENZAPRINE HCL 10 MG PO TABS
10.0000 mg | ORAL_TABLET | Freq: Every evening | ORAL | 1 refills | Status: DC | PRN
  Filled 2024-03-26: qty 90, 90d supply, fill #0

## 2024-03-26 NOTE — Patient Instructions (Signed)
Ejercicios para la espalda Back Exercises Los siguientes ejercicios fortalecen los msculos que dan soporte al tronco (torso) y a la espalda. Adems, ayudan a mantener la flexibilidad de la zona lumbar. Hacer estos ejercicios puede ser de ayuda para evitar o aliviar el dolor lumbar. Si tiene dolor o molestias en la espalda, intente hacer estos ejercicios 2 o 3 veces por da, o como se lo haya indicado el mdico. A medida que el dolor desaparece, hgalos una vez por da, pero aumente la cantidad de veces que repite los pasos para cada ejercicio (haga ms repeticiones). Para prevenir la recurrencia del dolor de espalda, contine haciendo estos ejercicios una vez al da o como se lo haya indicado el mdico. Haga los ejercicios exactamente como se lo haya indicado el mdico y gradelos como se lo hayan indicado. Es normal sentir un leve estiramiento, tirn, rigidez o molestia cuando haga estos ejercicios, pero debe detenerse de inmediato si siente un dolor repentino o si el dolor empeora. Ejercicios Rodilla al pecho Repita estos pasos 3 a 5 veces con cada pierna: Acustese boca arriba sobre una cama dura o sobre el suelo con las piernas extendidas. Lleve una rodilla al pecho. La otra pierna debe quedar extendida y en contacto con el suelo. Mantenga la rodilla contra el pecho al tomarse la rodilla o el muslo con ambas manos y sostenga. Tire de la rodilla hasta sentir una elongacin suave en la parte baja de la espalda o las nalgas. Mantenga la elongacin durante 10 a 30 segundos. Suelte y extienda la pierna lentamente.  Inclinacin de la pelvis Repita estos pasos 5 a 10 veces: Acustese boca arriba sobre una cama dura o sobre el suelo con las piernas extendidas. Flexione las rodillas de modo que apunten al techo y los pies queden apoyados en el suelo. Contraiga los msculos de la parte baja del abdomen para empujar la zona lumbar contra el suelo. Con este movimiento se inclinar la pelvis de modo que  el coxis apunte hacia el techo, en lugar de apuntar a los pies o al suelo. Contraiga suavemente y respire con normalidad mientras mantiene esta posicin durante 5 a 10 segundos.  El perro y el gato Repita estos pasos hasta que la zona lumbar se vuelva ms flexible: Apoye las palmas de las manos y las rodillas sobre una cama firme o el suelo. Las manos deben estar alineadas con los hombros y las rodillas con las caderas. Puede colocarse almohadillas debajo de las rodillas para estar cmodo. Deje que la cabeza cuelgue hacia el pecho. Contraiga los msculos abdominales y baje el coxis en direccin al suelo de modo que la zona lumbar se arquee como el lomo de un gato asustado. Mantenga esta posicin durante 5 segundos. Lentamente, levante la cabeza, relaje los msculos abdominales y eleve el coxis de modo que apunte en direccin al techo para que la espalda forme un arco hundido como el lomo de un perro contento. Mantenga esta posicin durante 5 segundos.  Flexiones de brazos Repita estos pasos 5 a 10 veces: Acustese sobre el abdomen (boca abajo) en una cama firme o en el suelo. Coloque las palmas de las manos cerca de la cabeza, separadas aproximadamente al ancho de los hombros. Con la espalda lo ms relajada posible y las caderas apoyadas en el suelo, extienda lentamente los brazos para levantar la mitad superior del cuerpo y elevar los hombros. No use los msculos de la espalda para elevar la parte superior del torso. Puede cambiar las manos de   lugar para estar ms cmodo. Mantenga esta posicin durante 5 segundos mientras mantiene la espalda relajada. Lentamente vuelva a la posicin horizontal.  Puentes Repita estos pasos 10 veces: Acustese boca arriba sobre una cama firme o sobre el suelo. Flexione las rodillas de modo que apunten al techo y los pies queden apoyados en el suelo. Los brazos deben estar paralelos a los costados del cuerpo, junto al cuerpo. Contraiga los glteos y despegue las  nalgas del suelo hasta que la cintura est casi a la misma altura que las rodillas. Debe sentir el trabajo muscular en las nalgas y la parte de atrs de los muslos. Si no siente el esfuerzo de [employer], aleje los pies 1 a 2 pulgadas (2.5 a 5 cm) de las nalgas. Mantenga esta posicin durante 3 a 5 segundos. Baje lentamente las caderas a la posicin inicial y relaje las nalgas por completo. Si este ejercicio le resulta muy fcil, intente realizarlo con los brazos cruzados [place]. Abdominales Repita estos pasos 5 a 10 veces: Acustese boca arriba sobre una cama dura o sobre el suelo con las piernas extendidas. Flexione las rodillas de modo que apunten al techo y los pies queden apoyados en el suelo. Cruce los [employer]. Baje levemente el mentn en direccin al pecho sin doblar el cuello. Contraiga los msculos abdominales y con lentitud eleve el torso lo suficiente como para Artist los omplatos del suelo. No eleve el torso ms que eso, porque esto puede sobreexigir a la zona lumbar y no ayuda a Physiological scientist abdominales. Regrese lentamente a la posicin inicial.  Elevaciones de espalda Repita estos pasos 5 a 10 veces: Acustese sobre el abdomen (boca abajo) con los brazos a los costados del cuerpo y apoye la frente en el suelo. Contraiga los msculos de las piernas y las nalgas. Lentamente despegue el pecho del suelo mientras mantiene las caderas bien apoyadas en el suelo. Mantenga la nuca alineada con la curvatura de la espalda. Los ojos deben mirar al suelo. Mantenga esta posicin durante 3 a 5 segundos. Regrese lentamente a la posicin inicial.  Comunquese con un mdico si: El dolor o las molestias en la espalda se vuelven mucho ms intensos cuando hace un ejercicio. El dolor o las molestias en la espalda que [place], no se Copy en el trmino de las 2 horas posteriores a Copy. Si tiene alguno de [employer], deje de  [employer] ejercicios de inmediato. No vuelva a hacer los ejercicios a menos que el mdico lo autorice. Solicite ayuda de inmediato si: Siente un dolor sbito e intenso en la espalda. Si esto ocurre, deje de [employer] ejercicios de inmediato. No vuelva a hacer los ejercicios a menos que el mdico lo autorice. Esta informacin no tiene Theme park manager el consejo del mdico. Asegrese de hacerle al mdico cualquier pregunta que tenga. Document Revised: 03/04/2021 Document Reviewed: 03/04/2021 Elsevier Patient Education  2024 [employer].

## 2024-03-26 NOTE — Progress Notes (Unsigned)
 Subjective:  Patient ID: Eileen [name], female    DOB: June 04, 1971  Age: 53 y.o. MRN: [id number]  CC: Medical Management of Chronic Issues     Discussed the use of AI scribe software for clinical note transcription with the patient, who gave verbal consent to proceed.  History of Present Illness Eileen [name] is a 53 year old female Asthma, prediabetes, depression. who presents for follow-up on her medication regimen and recent fall.  She has been feeling better since her Effexor  dose was increased to 150 mg daily in February. She uses hydroxyzine  as needed for sleep and anxiety and has completed her current supply of Effexor .  She experienced a fall on Monday when her foot bent as she attempted to step, nearly causing her to fall. She reports pain from her waist down to her feet, which has been present for about two weeks prior to the fall. Her foot hurts internally, but she is unsure about swelling.  She has asthma and uses an albuterol  inhaler as needed. She does not currently smoke but has a past smoking history. She has vitamin D  deficiency and finds supplementation helpful. She inquires about continuing vitamin D  and montelukast , which she takes at bedtime. She has used a muscle relaxant for back pain in the past.    Past Medical History:  Diagnosis Date   Allergy    Pollen    Arthritis    Asthma    CHF (congestive heart failure) (HCC)    unsure thinks lungs had fluid around after surgery 7 years ago   Chronic kidney disease    kidney infection   Neuromuscular disorder (HCC)    HERNIA   Right inguinal hernia    Shortness of breath     Past Surgical History:  Procedure Laterality Date   CESAREAN SECTION     x2   CESAREAN SECTION     COLONOSCOPY     HERNIA REPAIR  09/12/11   RIH   HERNIA REPAIR     INGUINAL HERNIA REPAIR  09/12/2011   Procedure: HERNIA REPAIR INGUINAL ADULT;  Surgeon: Camellia CHRISTELLA Blush, MD;  [facility];  Service: General;   Laterality: Right;  open right inguinal hernia with mesh    POLYPECTOMY     TUBAL LIGATION      Family History  Problem Relation Age of Onset   Ulcers Father        stomach   Diabetes Brother    Esophageal cancer Nephew    Diabetes Brother    Colon cancer Neg Hx    Rectal cancer Neg Hx    Stomach cancer Neg Hx    Breast cancer Neg Hx    Colon polyps Neg Hx     Social History   Socioeconomic History   Marital status: Legally Separated    Spouse name: Not on file   Number of children: 4   Years of education: Not on file   Highest education level: 6th grade  Occupational History   Occupation: Education officer, environmental  Tobacco Use   Smoking status: Former    Current packs/day: 0.50    Average packs/day: 0.5 packs/day for 20.0 years (10.0 ttl pk-yrs)    Types: Cigarettes   Smokeless tobacco: Never   Tobacco comments:    recently quit  Vaping Use   Vaping status: Never Used  Substance and Sexual Activity   Alcohol use: No   Drug use: No   Sexual activity: Yes    Birth control/protection: Surgical  Other Topics Concern   Not on file  Social History Narrative   ** Merged History Encounter **       Social Drivers of Corporate investment banker Strain: Not on file  Food Insecurity: Not on file  Transportation Needs: No Transportation Needs (05/06/2020)   PRAPARE - Administrator, Civil Service (Medical): No    Lack of Transportation (Non-Medical): No  Physical Activity: Not on file  Stress: Not on file  Social Connections: Not on file    No Known Allergies  Outpatient Medications Prior to Visit  Medication Sig Dispense Refill   albuterol  (PROAIR  HFA) 108 (90 Base) MCG/ACT inhaler INHALE 2 PUFFS INTO THE LUNGS EVERY 6 (SIX) HOURS AS NEEDED FOR WHEEZING OR SHORTNESS OF BREATH. 6.7 g 2   hydrOXYzine  (ATARAX ) 25 MG tablet Take 1 tablet (25 mg total) by mouth at bedtime as needed. 90 tablet 1   venlafaxine  XR (EFFEXOR  XR) 150 MG 24 hr capsule Take 1 capsule (150 mg  total) by mouth daily with breakfast. 90 capsule 1   acetaminophen  (TYLENOL ) 500 MG tablet Take 500 mg by mouth every 6 (six) hours as needed for mild pain, headache or fever. (Patient not taking: Reported on 10/24/2023)     benzonatate  (TESSALON ) 100 MG capsule Take 1 capsule (100 mg total) by mouth 2 (two) times daily as needed for cough. (Patient not taking: Reported on 03/26/2024) 20 capsule 0   cetirizine  (ZYRTEC ) 10 MG tablet Take 10 mg by mouth daily as needed for allergies. (Patient not taking: Reported on 03/26/2024)     cyclobenzaprine  (FLEXERIL ) 10 MG tablet Take 1 tablet (10 mg total) by mouth at bedtime as needed for muscle spasms. (Patient not taking: Reported on 03/26/2024) 90 tablet 0   ergocalciferol  (DRISDOL ) 1.25 MG (50000 UT) capsule Take 1 capsule (50,000 Units total) by mouth once a week. (Patient not taking: Reported on 03/26/2024) 12 capsule 1   gabapentin  (NEURONTIN ) 300 MG capsule Take 1 capsule (300 mg total) by mouth at bedtime. (Patient not taking: Reported on 03/26/2024) 30 capsule 3   guaiFENesin -dextromethorphan  (ROBITUSSIN DM) 100-10 MG/5ML syrup Take 5 mLs by mouth every 4 (four) hours as needed for cough. (Patient not taking: Reported on 03/26/2024) 118 mL 0   meloxicam  (MOBIC ) 15 MG tablet Take 1 tablet (15 mg total) by mouth daily. (Patient not taking: Reported on 03/26/2024) 30 tablet 2   mometasone -formoterol  (DULERA ) 100-5 MCG/ACT AERO Inhale 2 puffs into the lungs in the morning and at bedtime. (Patient not taking: Reported on 03/26/2024) 13 g 3   montelukast  (SINGULAIR ) 10 MG tablet Take 1 tablet (10 mg total) by mouth at bedtime. (Patient not taking: Reported on 03/26/2024) 30 tablet 3   predniSONE  (DELTASONE ) 20 MG tablet Take 1 tablet (20 mg total) by mouth daily with breakfast. (Patient not taking: Reported on 03/26/2024) 5 tablet 0   No facility-administered medications prior to visit.     ROS Review of Systems  Constitutional:  Negative for activity change and  appetite change.  HENT:  Negative for sinus pressure and sore throat.   Respiratory:  Negative for chest tightness, shortness of breath and wheezing.   Cardiovascular:  Negative for chest pain and palpitations.  Gastrointestinal:  Negative for abdominal distention, abdominal pain and constipation.  Genitourinary: Negative.   Musculoskeletal:        See HPI  Psychiatric/Behavioral:  Negative for behavioral problems and dysphoric mood.    *** Objective:  BP 117/82  Pulse 88   Ht 5' 1 (1.549 m)   Wt 187 lb 9.6 oz (85.1 kg)   SpO2 94%   BMI 35.45 kg/m      03/26/2024    3:32 PM 10/24/2023    4:29 PM 07/24/2023    4:03 PM  BP/Weight  Systolic BP 117 124 115  Diastolic BP 82 77 73  Wt. (Lbs) 187.6 183.4 178.4  BMI 35.45 kg/m2 34.65 kg/m2 33.71 kg/m2      Physical Exam ***    Latest Ref Rng & Units 10/24/2023    5:05 PM 11/15/2022    3:37 PM 10/12/2022    2:29 PM  CMP  Glucose 70 - 99 mg/dL 869  874  896   BUN 6 - 24 mg/dL 16  12  14    Creatinine 0.57 - 1.00 mg/dL 9.33  9.31  9.42   Sodium 134 - 144 mmol/L 139  140  142   Potassium 3.5 - 5.2 mmol/L 3.9  4.1  4.4   Chloride 96 - 106 mmol/L 105  106  105   CO2 20 - 29 mmol/L 21  20  19    Calcium 8.7 - 10.2 mg/dL 9.3  9.4  9.4   Total Protein 6.0 - 8.5 g/dL 6.5  6.6    Total Bilirubin 0.0 - 1.2 mg/dL 0.3  0.3    Alkaline Phos 44 - 121 IU/L 134  110    AST 0 - 40 IU/L 17  18    ALT 0 - 32 IU/L 16  32      Lipid Panel     Component Value Date/Time   CHOL 170 11/18/2015 0942   TRIG 85 11/18/2015 0942   HDL 48 11/18/2015 0942   CHOLHDL 3.5 11/18/2015 0942   CHOLHDL 4.0 Ratio 06/28/2010 2048   VLDL 26 06/28/2010 2048   LDLCALC 105 (H) 11/18/2015 0942    CBC    Component Value Date/Time   WBC 9.1 12/08/2020 2210   RBC 4.08 12/08/2020 2210   HGB 12.8 12/08/2020 2210   HGB 13.7 09/06/2018 1230   HCT 38.0 12/08/2020 2210   HCT 40.2 09/06/2018 1230   PLT 270 12/08/2020 2210   PLT 304 09/06/2018 1230   MCV 93.1  12/08/2020 2210   MCV 92 09/06/2018 1230   MCH 31.4 12/08/2020 2210   MCHC 33.7 12/08/2020 2210   RDW 13.3 12/08/2020 2210   RDW 13.1 09/06/2018 1230   LYMPHSABS 2.7 08/04/2020 1939   MONOABS 1.2 (H) 08/04/2020 1939   EOSABS 0.1 08/04/2020 1939   BASOSABS 0.1 08/04/2020 1939    Lab Results  Component Value Date   HGBA1C 5.8 10/24/2023      There are no diagnoses linked to this encounter.  No orders of the defined types were placed in this encounter.   Follow-up: No follow-ups on file.       Corrina Sabin, MD, FAAFP. [facility] [place], [place] [phone]   03/26/2024, 4:00 PM

## 2024-03-27 ENCOUNTER — Other Ambulatory Visit: Payer: Self-pay

## 2024-03-27 ENCOUNTER — Encounter: Payer: Self-pay | Admitting: Family Medicine

## 2024-05-01 ENCOUNTER — Other Ambulatory Visit: Payer: Self-pay

## 2024-05-01 DIAGNOSIS — N644 Mastodynia: Secondary | ICD-10-CM

## 2024-05-07 ENCOUNTER — Other Ambulatory Visit: Payer: Self-pay

## 2024-05-15 ENCOUNTER — Other Ambulatory Visit: Payer: Self-pay

## 2024-05-29 ENCOUNTER — Ambulatory Visit: Payer: Self-pay

## 2024-05-29 ENCOUNTER — Other Ambulatory Visit: Payer: Self-pay

## 2024-05-30 ENCOUNTER — Emergency Department (HOSPITAL_BASED_OUTPATIENT_CLINIC_OR_DEPARTMENT_OTHER)
Admission: EM | Admit: 2024-05-30 | Discharge: 2024-05-30 | Disposition: A | Discharge status: Home / Self Care | Payer: Self-pay | Attending: Emergency Medicine | Admitting: Emergency Medicine

## 2024-05-30 ENCOUNTER — Other Ambulatory Visit: Payer: Self-pay

## 2024-05-30 ENCOUNTER — Emergency Department (HOSPITAL_BASED_OUTPATIENT_CLINIC_OR_DEPARTMENT_OTHER): Payer: Self-pay

## 2024-05-30 DIAGNOSIS — M5431 Sciatica, right side: Secondary | ICD-10-CM

## 2024-05-30 DIAGNOSIS — M5441 Lumbago with sciatica, right side: Secondary | ICD-10-CM | POA: Insufficient documentation

## 2024-05-30 LAB — PREGNANCY, URINE: Preg Test, Ur: NEGATIVE

## 2024-05-30 MED ORDER — KETOROLAC TROMETHAMINE 60 MG/2ML IM SOLN
60.0000 mg | Freq: Once | INTRAMUSCULAR | Status: AC
  Administered 2024-05-30: 60 mg via INTRAMUSCULAR
  Filled 2024-05-30: qty 2

## 2024-05-30 MED ORDER — METHYLPREDNISOLONE 4 MG PO TBPK
ORAL_TABLET | ORAL | 0 refills | Status: DC
  Filled 2024-05-30: qty 21, 6d supply, fill #0

## 2024-05-30 MED ORDER — DEXAMETHASONE SODIUM PHOSPHATE 10 MG/ML IJ SOLN
10.0000 mg | Freq: Once | INTRAMUSCULAR | Status: AC
  Administered 2024-05-30: 10 mg via INTRAMUSCULAR
  Filled 2024-05-30: qty 1

## 2024-05-30 MED ORDER — METHOCARBAMOL 500 MG PO TABS
500.0000 mg | ORAL_TABLET | Freq: Three times a day (TID) | ORAL | 0 refills | Status: DC | PRN
  Filled 2024-05-30: qty 21, 7d supply, fill #0

## 2024-05-30 MED ORDER — CELECOXIB 200 MG PO CAPS
200.0000 mg | ORAL_CAPSULE | Freq: Two times a day (BID) | ORAL | 0 refills | Status: DC
  Filled 2024-05-30: qty 20, 10d supply, fill #0

## 2024-05-30 NOTE — Discharge Instructions (Signed)
 ### Sciatica Home Care Guide     **What is Sciatica?**      Sciatica is pain that travels down the leg from the lower back, often caused by pressure on a nerve. Most people get better with time and simple treatments at home.[1][2][3]      **Treatment Options**      - **Methylprednisolone  Dose Pack:** This is a steroid medicine sometimes used for short-term relief. It may help with leg pain, but its benefits are usually small and side effects like trouble sleeping or increased appetite can occur. It is not always needed and should be used only as directed by your doctor.[4][5][6]      - **Nonsteroidal Anti-Inflammatory Drugs (NSAIDs):** Medicines like ibuprofen  or naproxen can help with pain. They may cause stomach upset or kidney problems, so use the lowest dose for the shortest time needed.[2][3][7]      - **Methocarbamol :** This is a muscle relaxant that may help with pain, but it can cause drowsiness. Use only as prescribed.[2]      **Recommended Stretches and Exercises**      - **Gentle stretching:** Try lying on your back and slowly pulling one knee toward your chest, holding for 10 seconds, then switching legs.        - **Directional preference exercises:** Move in ways that make the pain move toward your back and away from your leg.        - **Walking:** Stay as active as you can. Short walks are helpful.        - **Core strengthening:** Simple exercises like pelvic tilts or gentle yoga can help.[1][8]      **Home Care Instructions**      - **Stay active:** Avoid bed rest. Move as much as your pain allows.[1][2][3]      - **Use heat:** A warm pack on your lower back may help with pain.[2][8]      - **Pain medicine:** Take NSAIDs or muscle relaxants only as directed.      - **Good posture:** Sit and stand up straight. Avoid slouching.      - **Avoid heavy lifting:** Do not lift heavy objects until you feel better.      **When to Return to the Doctor**      Call your  doctor or go to the emergency room if you have:      - **New weakness in your leg**      - **Trouble controlling your bladder or bowels**      - **Severe pain that does not get better**      - **Fever or unexplained weight loss**      **Outpatient Follow-Up**      Most people improve in a few weeks. Schedule a follow-up visit if your pain does not get better in 4-6 weeks, or sooner if you have concerns.[3][8]      ---      **Qu es la citica?**      La citica es dolor que baja por la pierna desde la parte baja de la espalda, generalmente causado por presin en un nervio. La [employer] de las personas mejora con el tiempo y tratamientos sencillos en casa.[1][2][3]      **Opciones de tratamiento**      - **Paquete de metilprednisolona:** Es un medicamento esteroide que a veces se usa  para alivio a corto plazo. Puede ayudar con el dolor de pierna, pero los beneficios suelen ser pequeos y puede causar efectos secundarios como insomnio o aumento de apetito. selo solo  si su mdico lo indica.[4][5][6]      - **Antiinflamatorios no esteroideos (AINEs):** Medicamentos como ibuprofeno o naproxeno pueden ayudar con el dolor. Pueden causar malestar estomacal o problemas renales, as que use la dosis ms baja por el menor tiempo posible.[2][3][7]      - **Metocarbamol:** Es un relajante muscular que puede ayudar con el dolor, pero puede causar sueo. selo solo si su mdico lo receta.[2]      **Estiramientos y ejercicios recomendados**      - **Estiramiento suave:** Acustese de espaldas y lleve una rodilla hacia el [place] 10 segundos y [place] de pierna.        - **Ejercicios de preferencia direccional:** Muvase de manera que el dolor se acerque a la espalda y se aleje de la pierna.        - **Caminar:** Mantngase activo. Caminatas cortas ayudan.        - **Fortalecimiento del abdomen:** Ejercicios simples como inclinaciones plvicas o yoga suave pueden ayudar.[1][8]       **Instrucciones para el cuidado en casa**      - **Mantngase activo:** Evite el reposo en cama. Muvase tanto como el dolor lo permita.[1][2][3]      - **Use calor:** Una bolsa tibia en la espalda baja puede ayudar con el dolor.[2][8]      - **Medicamentos para el dolor:** Tome AINEs o relajantes musculares solo como se indica.      - **Buena postura:** Sintese y prese derecho. Evite encorvarse.      - **Evite levantar objetos pesados:** No levante cosas pesadas hasta que se sienta mejor.      **Cundo regresar al mdico**      Llame a su mdico o vaya a la sala de emergencias si tiene:      - **Debilidad nueva en la pierna**      - **Dificultad para controlar la vejiga o el intestino**      - **Dolor severo que no mejora**      - **Fiebre o prdida de peso inexplicable**      **Seguimiento ambulatorio**      La mayora mejora en unas semanas. Programe una visita de seguimiento si el dolor no mejora en 4-6 semanas, o antes si tiene dudas.[3][8]      ### References  1. Sciatica. Ropper AH, Zafonte RD. The [place] Journal of Medicine. 2015;372(13):1240-8. doi:10.1056/NEJMra1410151. 2. Noninvasive Treatments for Acute, Subacute, and Chronic Low Back Pain: A Clinical Practice Guideline From the [employer] of [employer]. Qaseem A, Wilt TJ, McLean RM, et al. Annals of Internal Medicine. 2017;166(7):514-530. doi:10.7326/M16-2367. 3. Herniated Lumbar Intervertebral Disk. Deyo RA, Jackee FREED. The [place] Journal of Medicine. 2016;374(18):1763-72. doi:10.1056/NEJMcp1512658. 4. Effectiveness of Nonsurgical Interventions for Patients With Acute and Subacute Sciatica: A Systematic Review With Network Meta-Analysis. Roselie JENEANE Eual ONEIDA, Strijkers R, et al. The Journal of Orthopaedic and Sports Physical Therapy. 2025;55(6):1-12. doi:10.2519/jospt.7974.86931. 5. Short-Term Efficiency and Tolerance of Ketoprofen and Methylprednisolone  in Acute Sciatica: A Randomized Trial. Araceli JONELLE Lida CHRISTELLA, Roustit M, et al. Pain Medicine [facility] [place] 2019;20(7):1294-1299. doi:10.1093/pm/pny252. 6. Efficacy and Harms of Orally, Intramuscularly or Intravenously Administered Glucocorticoids for Sciatica: A Systematic Review and Meta-Analysis. [address], Maher CG, Buchbinder R, et al. European Journal of Pain [place], [place]). 2020;24(3):518-535. doi:10.1002/ejp.1505. 7. Non-Steroidal Anti-Inflammatory Drugs for Sciatica. Rasmussen-Barr E, Held U, Grooten WJ, et al. The Cochrane Database of Systematic Reviews. 2016;10:CD012382. doi:10.1002/14651858.CD012382. 8. Non-Invasive and Minimally Invasive Management of Low Back Disorders. Hegmann KT, Caron JONELLE Force GBJ, et al.  Journal of Occupational and Environmental Medicine. 2020;62(3):e111-e138. doi:10.1097/JOM.0000000000001812.

## 2024-05-30 NOTE — ED Triage Notes (Signed)
 Teleinterpreter - [id number] Catalina- Pt c/o R hip progressing to both side, radiates to R foot x 2 months. Denies known injury.

## 2024-05-30 NOTE — ED Provider Notes (Signed)
 [place] [facility] AT [facility] Provider Note   CSN: [id number] Arrival date & time: 05/30/24  1722     Patient presents with: Hip Pain   Eileen [name] is a 53 y.o. female who presents emergency department with chief complaint of hip pain.  Patient reports that she has had bilateral hip pain however began having pain in the lower back on the right side, right buttock radiating down the back of her leg and into her foot starting 2 months ago.  Recently the became pain became severe.  She denies saddle anesthesia loss of control of her bowel or bladder, leg weakness.  Pain is worse when she tries to bear weight on the leg or sits for long periods of time.  Pain radiates down the side of the leg outer calf and into the outside of the right foot.   The history is limited by a language barrier. Language interpreter used: interpreter [phone].  Hip Pain       Prior to Admission medications   Medication Sig Start Date End Date Taking? Authorizing Provider  celecoxib (CELEBREX) 200 MG capsule Take 1 capsule (200 mg total) by mouth 2 (two) times daily. 05/30/24  Yes Yan Pankratz, PA-C  methocarbamol  (ROBAXIN ) 500 MG tablet Take 1 tablet (500 mg total) by mouth 3 (three) times daily as needed for muscle spasms. 05/30/24  Yes Ameliya Nicotra, PA-C  methylPREDNISolone  (MEDROL  DOSEPAK) 4 MG TBPK tablet Use as directed 05/30/24  Yes Alyjah Lovingood, PA-C  acetaminophen  (TYLENOL ) 500 MG tablet Take 500 mg by mouth every 6 (six) hours as needed for mild pain, headache or fever. Patient not taking: Reported on 10/24/2023    [provider]  albuterol  (PROAIR  HFA) 108 (90 Base) MCG/ACT inhaler INHALE 2 PUFFS INTO THE LUNGS EVERY 6 (SIX) HOURS AS NEEDED FOR WHEEZING OR SHORTNESS OF BREATH. 03/26/24   Newlin, Enobong, MD  cetirizine  (ZYRTEC ) 10 MG tablet Take 1 tablet (10 mg total) by mouth daily. 03/26/24   Newlin, Enobong, MD  ergocalciferol  (DRISDOL ) 1.25 MG (50000  UT) capsule Take 1 capsule (50,000 Units total) by mouth once a week. 03/26/24   Newlin, Enobong, MD  hydrOXYzine  (ATARAX ) 25 MG tablet Take 1 tablet (25 mg total) by mouth at bedtime as needed. 03/26/24   Newlin, Enobong, MD  venlafaxine  XR (EFFEXOR  XR) 150 MG 24 hr capsule Take 1 capsule (150 mg total) by mouth daily with breakfast. 03/26/24   Newlin, Enobong, MD    Allergies: Patient has no known allergies.    Review of Systems  Updated Vital Signs BP 114/75 (BP Location: Left Arm)   Pulse 79   Temp 98.3 F (36.8 C) (Oral)   Resp 20   Wt 81.6 kg   SpO2 95%   BMI 34.01 kg/m   Physical Exam Vitals and nursing note reviewed.  Constitutional:      General: She is not in acute distress.    Appearance: She is well-developed. She is not diaphoretic.  HENT:     Head: Normocephalic and atraumatic.     Right Ear: External ear normal.     Left Ear: External ear normal.     Nose: Nose normal.     Mouth/Throat:     Mouth: Mucous membranes are moist.  Eyes:     General: No scleral icterus.    Conjunctiva/sclera: Conjunctivae normal.  Cardiovascular:     Rate and Rhythm: Normal rate and regular rhythm.     Heart sounds: Normal heart  sounds. No murmur heard.    No friction rub. No gallop.  Pulmonary:     Effort: Pulmonary effort is normal. No respiratory distress.     Breath sounds: Normal breath sounds.  Abdominal:     General: Bowel sounds are normal. There is no distension.     Palpations: Abdomen is soft. There is no mass.     Tenderness: There is no abdominal tenderness. There is no guarding.  Musculoskeletal:     Cervical back: Normal range of motion.     Comments: Lumbosacral spine area reveals moderate tenderness and no spasm.  Painful and reduced LS ROM noted. Straight leg raise is positive at 30 degrees on right. DTR's, motor strength and sensation normal.  Gait is antalgic with severe limp on the right side due to pain with bearing weight. Peripheral pulses are palpable.  No abdominal tenderness, mass or organomegaly.   Skin:    General: Skin is warm and dry.  Neurological:     Mental Status: She is alert and oriented to person, place, and time.  Psychiatric:        Behavior: Behavior normal.     (all labs ordered are listed, but only abnormal results are displayed) Labs Reviewed  PREGNANCY, URINE    EKG: None  Radiology: No results found.   Procedures   Medications Ordered in the ED  ketorolac (TORADOL) injection 60 mg (60 mg Intramuscular Given 05/30/24 2023)  dexamethasone (DECADRON) injection 10 mg (10 mg Intramuscular Given 05/30/24 2026)    Clinical Course as of 06/02/24 1238  Fri May 30, 2024  2152 DG Lumbar Spine Complete [AH]    Clinical Course User Index [AH] Arloa Chroman, PA-C                                 Medical Decision Making Patient sxs consistent with Sciatica. NO foot drop or red flag sxs. Patient can walk but states is painful.  No loss of bowel or bladder control.  No concern for cauda equina.  No fever, night sweats, weight loss, h/o cancer, IVDU.  RICE protocol and pain medicine indicated and discussed with patient.    Amount and/or Complexity of Data Reviewed Labs: ordered. Radiology: ordered and independent interpretation performed. Decision-making details documented in ED Course.    Details: I personally visualized and interpreted the images using our PACS system. Acute findings include:  No acute findings on lumbar plain film  Risk Prescription drug management.      Final diagnoses:  Sciatica of right side    ED Discharge Orders          Ordered    methylPREDNISolone  (MEDROL  DOSEPAK) 4 MG TBPK tablet        05/30/24 2214    celecoxib (CELEBREX) 200 MG capsule  2 times daily        05/30/24 2214    methocarbamol  (ROBAXIN ) 500 MG tablet  3 times daily PRN        05/30/24 2214               Arloa Chroman, PA-C 06/02/24 1238    Zackowski, Scott, MD 06/02/24 1752

## 2024-06-02 ENCOUNTER — Other Ambulatory Visit: Payer: Self-pay

## 2024-06-27 ENCOUNTER — Other Ambulatory Visit: Payer: Self-pay

## 2024-06-27 ENCOUNTER — Ambulatory Visit: Payer: Self-pay

## 2024-06-27 NOTE — Telephone Encounter (Signed)
 FYI Only or Action Required?: FYI only for provider: advised urgent care or the ED.  Patient was last seen in primary care on 03/26/2024 by Newlin, Enobong, MD.  Called Nurse Triage reporting Hip Pain.  Symptoms began several months ago.  Interventions attempted: Other: has been seen in the ED.  Symptoms are: gradually worsening.  Triage Disposition: See PCP When Office is Open (Within 3 Days)  Patient/caregiver understands and will follow disposition?: No appointment, advised urgent care or the ED      Copied from CRM [id number]. Topic: Clinical - Red Word Triage >> Jun 27, 2024 10:33 AM Willma SAUNDERS wrote: Red Word that prompted transfer to Nurse Triage: Patient was in the hospital on 10/03 for sciatica of right side, states the pain went away for a little bit when she was taking the medication they gave her but it has now come back and she feels like it is worse. It now starts at her belly button goes down to both hips and into her feet.  Jodie, interpreter# [phone]       Reason for Disposition  [1] MODERATE pain (e.g., interferes with normal activities, limping) AND [2] present > 3 days  Answer Assessment - Initial Assessment Questions No appointments available, advised to go to the ED or urgent care.      1. LOCATION and RADIATION: Where is the pain located? Does the pain spread (shoot) anywhere else?     Right hip radiating across her waist to her left hip and down her bilateral legs down to her feet  2. QUALITY: What does the pain feel like?  (e.g., sharp, dull, aching, burning)     Sharp 3. SEVERITY: How bad is the pain? What does it keep you from doing?   (Scale 1-10; or mild, moderate, severe)     7/10 4. ONSET: When did the pain start? Does it come and go, or is it there all the time?     A few months ago  5. WORK OR EXERCISE: Has there been any recent work or exercise that involved this part of the body?      No 6. CAUSE: What do you think is  causing the hip pain?      Sciatica  7. AGGRAVATING FACTORS: What makes the hip pain worse? (e.g., walking, climbing stairs, running)     Walking  8. OTHER SYMPTOMS: Do you have any other symptoms? (e.g., back pain, pain shooting down leg,  fever, rash)     Intermittent difficulty walking stating her feet feel heavy  Protocols used: Hip Pain-A-AH

## 2024-06-27 NOTE — Telephone Encounter (Signed)
 Noted

## 2024-07-14 NOTE — Progress Notes (Deleted)
 Eileen [name] is a 53 y.o. female who presents to [facility] clinic today with {Blank single:19197::no complaints,complaint of} ***.    Pap Smear: Pap smear not completed today. Last Pap smear was 11/15/2022 at [facility] and was normal with negative HPV. Per patient has no history of an abnormal Pap smear. Last Pap smear result is available in Epic.   Physical exam: Breasts Breasts symmetrical. No skin abnormalities bilateral breasts. No nipple retraction bilateral breasts. No nipple discharge bilateral breasts. No lymphadenopathy. No lumps palpated bilateral breasts.      MS DIGITAL DIAG TOMO UNI LEFT Result Date: 05/20/2020 CLINICAL DATA:  Patient recalled from screening for left breast asymmetry. EXAM: DIGITAL DIAGNOSTIC UNILATERAL LEFT MAMMOGRAM WITH TOMO AND CAD COMPARISON:  Previous exam(s). ACR Breast Density Category c: The breast tissue is heterogeneously dense, which may obscure small masses. FINDINGS: Questioned asymmetry within the superior left breast on the MLO view appeared to resolve with additional imaging and appears similar to prior exams most compatible with dense fibroglandular tissue. No suspicious findings. Mammographic images were processed with CAD. IMPRESSION: No mammographic evidence for malignancy. RECOMMENDATION: Screening mammogram in one year.(Code:[id number]) I have discussed the findings and recommendations with the patient. If applicable, a reminder letter will be sent to the patient regarding the next appointment. BI-RADS CATEGORY  1: Negative. Electronically Signed   By: Bard Moats M.D.   On: 05/20/2020 10:22   MS DIGITAL SCREENING TOMO BILATERAL Result Date: 05/10/2020 CLINICAL DATA:  Screening. EXAM: DIGITAL SCREENING BILATERAL MAMMOGRAM WITH TOMO AND CAD COMPARISON:  Previous exam(s). ACR Breast Density Category c: The breast tissue is heterogeneously dense, which may obscure small masses. FINDINGS: In the left breast,  a possible asymmetry warrants further evaluation. In the right breast, no findings suspicious for malignancy. Images were processed with CAD. IMPRESSION: Further evaluation is suggested for possible asymmetry in the left breast. RECOMMENDATION: Diagnostic mammogram and possibly ultrasound of the left breast. (Code:[id number]) The patient will be contacted regarding the findings, and additional imaging will be scheduled. BI-RADS CATEGORY  0: Incomplete. Need additional imaging evaluation and/or prior mammograms for comparison. Electronically Signed   By: Rosaline Collet M.D.   On: 05/10/2020 08:04    Pelvic/Bimanual Pap is not indicated today per BCCCP guidelines.   Smoking History: Patient is a current smoker. Discussed smoking Cessation discussed with patient. Referred patient to the [facility] Quitline and gave resources to the free smoking cessation classes offered at [facility].    Patient Navigation: Patient education provided. Access to services provided for patient through [facility] program. Spanish interpreter Bernice Angry from [facility] provided.   Colorectal Cancer Screening: Per patient {Blank single:19197::has had colonoscopy completed on ***,has never had colonoscopy completed} No complaints today.    Breast and Cervical Cancer Risk Assessment: Patient does not have family history of breast cancer, known genetic mutations, or radiation treatment to the chest before age 15. Patient does not have history of cervical dysplasia, immunocompromised, or DES exposure in-utero.  Risk Assessment   No risk assessment data for the current encounter  Risk Scores       05/06/2020   Last edited by: Logan Lyle BRAVO, CMA   5-year risk: 0.6%   Lifetime risk: 5.9%            A: BCCCP exam without pap smear Complaint of ***  P: Referred patient to the [facility] for a diagnostic mammogram. Appointment scheduled Tuesday, July 15, 2024 at 1430.  Mckay Tegtmeyer,  Wanda SQUIBB,  RN 07/14/2024 10:31 AM

## 2024-07-15 ENCOUNTER — Ambulatory Visit: Payer: Self-pay

## 2024-07-15 ENCOUNTER — Inpatient Hospital Stay: Admission: RE | Admit: 2024-07-15 | Payer: Self-pay | Source: Ambulatory Visit

## 2024-07-15 ENCOUNTER — Other Ambulatory Visit: Payer: Self-pay

## 2024-07-15 DIAGNOSIS — Z1239 Encounter for other screening for malignant neoplasm of breast: Secondary | ICD-10-CM

## 2024-07-31 ENCOUNTER — Emergency Department (HOSPITAL_BASED_OUTPATIENT_CLINIC_OR_DEPARTMENT_OTHER)
Admission: EM | Admit: 2024-07-31 | Discharge: 2024-07-31 | Disposition: A | Discharge status: Home / Self Care | Payer: Self-pay | Attending: Emergency Medicine | Admitting: Emergency Medicine

## 2024-07-31 ENCOUNTER — Encounter (HOSPITAL_BASED_OUTPATIENT_CLINIC_OR_DEPARTMENT_OTHER): Payer: Self-pay

## 2024-07-31 ENCOUNTER — Other Ambulatory Visit: Payer: Self-pay

## 2024-07-31 DIAGNOSIS — M5441 Lumbago with sciatica, right side: Secondary | ICD-10-CM | POA: Insufficient documentation

## 2024-07-31 MED ORDER — KETOROLAC TROMETHAMINE 60 MG/2ML IM SOLN
60.0000 mg | Freq: Once | INTRAMUSCULAR | Status: AC
  Administered 2024-07-31: 60 mg via INTRAMUSCULAR
  Filled 2024-07-31: qty 2

## 2024-07-31 MED ORDER — DEXAMETHASONE SOD PHOSPHATE PF 10 MG/ML IJ SOLN
10.0000 mg | Freq: Once | INTRAMUSCULAR | Status: AC
  Administered 2024-07-31: 10 mg via INTRAMUSCULAR

## 2024-07-31 MED ORDER — METHOCARBAMOL 500 MG PO TABS
500.0000 mg | ORAL_TABLET | Freq: Three times a day (TID) | ORAL | 0 refills | Status: AC | PRN
  Filled 2024-07-31: qty 21, 7d supply, fill #0

## 2024-07-31 MED ORDER — CELECOXIB 200 MG PO CAPS
200.0000 mg | ORAL_CAPSULE | Freq: Two times a day (BID) | ORAL | 0 refills | Status: AC
  Filled 2024-07-31: qty 20, 10d supply, fill #0

## 2024-07-31 MED ORDER — METHYLPREDNISOLONE 4 MG PO TBPK
ORAL_TABLET | ORAL | 0 refills | Status: AC
  Filled 2024-07-31: qty 21, 6d supply, fill #0

## 2024-07-31 NOTE — ED Triage Notes (Signed)
 Pt reports sx x 8 days. Bilateral hip pain radiating down bilateral legs accompanied by tingling. Dx sciatica in October 2025. No meds PTA.

## 2024-07-31 NOTE — Discharge Instructions (Signed)
 Please read and follow all provided instructions.  Your diagnoses today include:  1. Acute bilateral low back pain with right-sided sciatica    Tests performed today include: Vital signs - see below for your results today  Medications prescribed:  Medrol  Dosepak  Robaxin  (methocarbamol ) - muscle relaxer medication  DO NOT drive or perform any activities that require you to be awake and alert because this medicine can make you drowsy.   Celebrex  - nonsteroidal anti-inflammatory medication  Take any prescribed medications only as directed.  Home care instructions:  Follow any educational materials contained in this packet Please rest, use ice or heat on your back for the next several days Do not lift, push, pull anything more than 10 pounds for the next week  Follow-up instructions: Please follow-up with your primary care provider or the neurosurgery referral listed in the next 1 week for further evaluation of your symptoms.   Return instructions:  SEEK IMMEDIATE MEDICAL ATTENTION IF YOU HAVE: New numbness, tingling, weakness, or problem with the use of your arms or legs Severe back pain not relieved with medications Loss control of your bowels or bladder Increasing pain in any areas of the body (such as chest or abdominal pain) Shortness of breath, dizziness, or fainting.  Worsening nausea (feeling sick to your stomach), vomiting, fever, or sweats Any other emergent concerns regarding your health   Additional Information:  Your vital signs today were: BP 125/69   Pulse 97   Temp 98.6 F (37 C)   Resp 19   Ht 5' 1 (1.549 m)   Wt 81.6 kg   SpO2 100%   BMI 34.01 kg/m  If your blood pressure (BP) was elevated above 135/85 this visit, please have this repeated by your doctor within one month. --------------

## 2024-07-31 NOTE — ED Provider Notes (Signed)
 [place] [facility] Provider Note   CSN: [id number] Arrival date & time: 07/31/24  2135     Patient presents with: Leg Pain   Eileen [name] is a 53 y.o. female.    Patient presents to the emergency department today for evaluation of recurrent low back pain and pain radiating down into her legs, right greater than the left.  Spanish video interpreter utilized for interview.  Patient was seen in the emergency department 2 months ago for similar symptoms.  She states that she was given medicine, took until it was completed.  This helped improve her symptoms and she states that I thought I was cured.  Her symptoms gradually came back and have become worse recently.  She denies any new falls or injuries.  She has some shooting pain that goes down into her bilateral buttocks.  Patient denies warning symptoms of back pain including: fecal incontinence, urinary retention or overflow incontinence, night sweats, waking from sleep with back pain, unexplained fevers or weight loss, h/o cancer, IVDU, recent trauma.  She did not follow-up with her primary care after her symptoms were treated.  She states that her doctor did not have any appointments.  Pain is worse with certain movements including with bearing weight, but she is able to walk without assistance.  Patient received IM Toradol  and IM dexamethasone  at previous ED visit and discharged with Medrol  Dosepak, Robaxin , Celebrex .        Prior to Admission medications   Medication Sig Start Date End Date Taking? Authorizing Provider  acetaminophen  (TYLENOL ) 500 MG tablet Take 500 mg by mouth every 6 (six) hours as needed for mild pain, headache or fever. Patient not taking: Reported on 10/24/2023    [provider]  albuterol  (PROAIR  HFA) 108 (90 Base) MCG/ACT inhaler INHALE 2 PUFFS INTO THE LUNGS EVERY 6 (SIX) HOURS AS NEEDED FOR WHEEZING OR SHORTNESS OF BREATH. 03/26/24   Newlin, Enobong, MD   celecoxib  (CELEBREX ) 200 MG capsule Take 1 capsule (200 mg total) by mouth 2 (two) times daily. 05/30/24   Harris, Abigail, PA-C  cetirizine  (ZYRTEC ) 10 MG tablet Take 1 tablet (10 mg total) by mouth daily. 03/26/24   Newlin, Enobong, MD  ergocalciferol  (DRISDOL ) 1.25 MG (50000 UT) capsule Take 1 capsule (50,000 Units total) by mouth once a week. 03/26/24   Newlin, Enobong, MD  hydrOXYzine  (ATARAX ) 25 MG tablet Take 1 tablet (25 mg total) by mouth at bedtime as needed. 03/26/24   Newlin, Enobong, MD  methocarbamol  (ROBAXIN ) 500 MG tablet Take 1 tablet (500 mg total) by mouth 3 (three) times daily as needed for muscle spasms. 05/30/24   Harris, Abigail, PA-C  methylPREDNISolone  (MEDROL  DOSEPAK) 4 MG TBPK tablet Use as directed 05/30/24   Harris, Abigail, PA-C  venlafaxine  XR (EFFEXOR  XR) 150 MG 24 hr capsule Take 1 capsule (150 mg total) by mouth daily with breakfast. 03/26/24   Newlin, Enobong, MD    Allergies: Patient has no known allergies.    Review of Systems  Updated Vital Signs BP 125/69   Pulse 97   Temp 98.6 F (37 C)   Resp 19   Ht 5' 1 (1.549 m)   Wt 81.6 kg   SpO2 100%   BMI 34.01 kg/m   Physical Exam Vitals and nursing note reviewed.  Constitutional:      Appearance: She is well-developed.  HENT:     Head: Normocephalic and atraumatic.  Eyes:     Conjunctiva/sclera: Conjunctivae normal.  Pulmonary:     Effort: Pulmonary effort is normal.  Abdominal:     Palpations: Abdomen is soft.     Tenderness: There is no abdominal tenderness.  Musculoskeletal:        General: Normal range of motion.     Cervical back: Normal range of motion and neck supple.     Thoracic back: No tenderness. Normal range of motion.     Lumbar back: Tenderness present. No bony tenderness. Normal range of motion.     Comments: No step-off noted with palpation of spine.   Skin:    General: Skin is warm and dry.     Findings: No rash.  Neurological:     Mental Status: She is alert.      Sensory: No sensory deficit.     Comments: 5/5 strength in entire lower extremities bilaterally. No sensation deficit.  Patient is able to stand from a seated position and ambulate in the room without assistance.  No foot drop.  She has some discomfort verbalized but no severe pain.     (all labs ordered are listed, but only abnormal results are displayed) Labs Reviewed - No data to display  EKG: None  Radiology: No results found.   Procedures   Medications Ordered in the ED  ketorolac  (TORADOL ) injection 60 mg (has no administration in time range)  dexamethasone  (DECADRON ) injection 10 mg (has no administration in time range)   ED Course  Patient seen and examined. History obtained directly from patient.  Reviewed previous ED visit.  Labs/EKG: None ordered.  Imaging: None ordered.   Medications/Fluids: Ordered: IM toradol , IM decadron   Most recent vital signs reviewed and are as follows: BP 125/69   Pulse 97   Temp 98.6 F (37 C)   Resp 19   Ht 5' 1 (1.549 m)   Wt 81.6 kg   SpO2 100%   BMI 34.01 kg/m   Initial impression: Low back pain with radicular features  Home treatment plan: Patient was counseled on back pain precautions and advised to do activity as tolerated but avoid strenuous activity and do not lift, push, or pull heavy objects more than 10 pounds for the next week.  Patient counseled to use ice or heat on back as needed for pain and spasm.    Medications prescribed: Medrol  Dosepak, Celebrex , Robaxin  for muscle pain and spasm. Patient counseled on proper use of muscle relaxant medication.  They were requested not to drink alcohol, drive any vehicle, or do any dangerous activities while taking this medication due to potential drowsiness and unintended.  Patient verbalized understanding.  Return instructions discussed with patient: Urged to return with worsening severe pain, loss of bowel or bladder control, trouble walking, development of weakness in the  legs, or with any other concerns.  Follow-up instructions discussed with patient: Discussed importance of following up with PCP for further evaluation.  Will also give follow-up with neurosurgery for further evaluation.                                   Medical Decision Making Risk Prescription drug management.   Patient with back pain, with some radicular features. No weakness. Patient is ambulatory. No warning symptoms of back pain including: fecal incontinence, urinary retention or overflow incontinence, night sweats, waking from sleep with back pain, unexplained fevers or weight loss, h/o cancer, IVDU, recent trauma. No concern for cauda equina, epidural abscess,  or other serious cause of back pain. Conservative measures such as rest, ice/heat and pain medicine indicated with PCP follow-up given recurrent symptoms.  Patient had plain films ordered at last visit.  Do not feel that she needs to be transferred for emergent MRI at this time.  Strongly encouraged outpatient follow-up.  Given improvement with regimen at last visit, will repeat tonight.     Final diagnoses:  Acute bilateral low back pain with right-sided sciatica    ED Discharge Orders          Ordered    celecoxib  (CELEBREX ) 200 MG capsule  2 times daily        07/31/24 2229    methocarbamol  (ROBAXIN ) 500 MG tablet  3 times daily PRN        07/31/24 2229    methylPREDNISolone  (MEDROL  DOSEPAK) 4 MG TBPK tablet        07/31/24 2229               Desiderio Chew, PA-C 07/31/24 2232    Cottie Donnice PARAS, MD 07/31/24 2240

## 2024-08-01 ENCOUNTER — Ambulatory Visit: Payer: Self-pay

## 2024-08-01 ENCOUNTER — Other Ambulatory Visit: Payer: Self-pay

## 2024-08-01 NOTE — Telephone Encounter (Addendum)
 FYI Only or Action Required?: FYI only for provider: appointment scheduled on 09/23/24. Pt refused earlier app with another provider, would like to see her PCP.   Patient was last seen in primary care on 03/26/2024 by Newlin, Enobong, MD.  Called Nurse Triage reporting Back Pain.  Symptoms began several months ago.  Interventions attempted: Rest, hydration, or home remedies.  Symptoms are: unchanged.  Triage Disposition: See Physician Within 24 Hours  Patient/caregiver understands and will follow disposition?: No, refuses disposition  Reason for Disposition  Numbness in a leg or foot (i.e., loss of sensation)  Answer Assessment - Initial Assessment Questions Patient calls in stating she went to the ER last night for lower back pain that radiates down both legs. She states that her pain last night when she went was severe, but right now it is 3-4/10. She reports trouble urinating and numbness from the waist down but states these have been ongoing with the pain and she reported them in the ER last night. Patient denies any new symptoms. Advised to see PCP within 24 hours, but no available appts. Pt does not want to see another doctor, appt scheduled for 09/23/24. Advised patient to visit UC, but patient is inquiring about medications. Advised patient to call back if symptoms worsen.  She was prescribed Celecoxib , Methocarbamol , and Methylprednisolone  from the ER stating that she took these medications before and they helped the pain until they were done. Patient's prescriptions from the ER stating prohibited in Epic and patient has not been able to receive her medications.   1. ONSET: When did the pain begin? (e.g., minutes, hours, days)     5 mos ago  2. LOCATION: Where does it hurt? (upper, mid or lower back)     It hurts from the waist down, but I feel it the most in the back of my waist area.   3. SEVERITY: How bad is the pain?  (e.g., Scale 1-10; mild, moderate, or severe)      Currently 3-4/10  4. PATTERN: Is the pain constant? (e.g., yes, no; constant, intermittent)      Constant  5. RADIATION: Does the pain shoot into your legs or somewhere else?     Yes, legs  6. CAUSE:  What do you think is causing the back pain?      Pt states she went to the ER last night and they mentioned a possible neurological cause  7. BACK OVERUSE:  Any recent lifting of heavy objects, strenuous work or exercise?     No  8. MEDICINES: What have you taken so far for the pain? (e.g., nothing, acetaminophen , NSAIDS)     No pain medicine this morning  9. NEUROLOGIC SYMPTOMS: Do you have any weakness, numbness, or problems with bowel/bladder control?     Sometimes my legs feel weak like they will give out.  Pt also mentions she feels like she has trouble urinating at times and feels numbness from the waist down   10. OTHER SYMPTOMS: Do you have any other symptoms? (e.g., fever, abdomen pain, burning with urination, blood in urine)       States she feels the pain in the lower abdomen as well  11. PREGNANCY: Is there any chance you are pregnant? When was your last menstrual period?      NA  Protocols used: Back Pain-A-AH  Triage done using interpreter services Angie ID# [id number]  Copied from CRM [id number]. Topic: Clinical - Red Word Triage >> Aug 01, 2024  9:35  AM Adelita E wrote: [facility] that prompted transfer to Nurse Triage: Patient was seen in the ED last night 12/4, symptoms have been worsening, low back pain radiating down to her legs, as she has been without medicine to help treat this issue.

## 2024-09-23 ENCOUNTER — Ambulatory Visit: Payer: Self-pay | Attending: Family Medicine | Admitting: Family Medicine

## 2024-09-25 ENCOUNTER — Other Ambulatory Visit: Payer: Self-pay

## 2024-09-26 ENCOUNTER — Other Ambulatory Visit: Payer: Self-pay

## 2024-09-29 ENCOUNTER — Ambulatory Visit: Payer: Self-pay | Admitting: Family Medicine

## 2024-09-29 ENCOUNTER — Telehealth: Payer: Self-pay | Admitting: Family Medicine
